# Patient Record
Sex: Female | Born: 1970
Health system: Southern US, Community
[De-identification: ages and names within clinical notes are randomized; demographics above are authoritative.]

## PROBLEM LIST (undated history)

## (undated) ENCOUNTER — Ambulatory Visit (HOSPITAL_COMMUNITY): Admission: EM

## (undated) DIAGNOSIS — N898 Other specified noninflammatory disorders of vagina: Secondary | ICD-10-CM

## (undated) DIAGNOSIS — M25559 Pain in unspecified hip: Secondary | ICD-10-CM

## (undated) DIAGNOSIS — M542 Cervicalgia: Secondary | ICD-10-CM

## (undated) DIAGNOSIS — Z973 Presence of spectacles and contact lenses: Secondary | ICD-10-CM

## (undated) DIAGNOSIS — M549 Dorsalgia, unspecified: Secondary | ICD-10-CM

## (undated) DIAGNOSIS — I890 Lymphedema, not elsewhere classified: Secondary | ICD-10-CM

## (undated) DIAGNOSIS — M130 Polyarthritis, unspecified: Secondary | ICD-10-CM

## (undated) DIAGNOSIS — M25552 Pain in left hip: Secondary | ICD-10-CM

## (undated) DIAGNOSIS — K219 Gastro-esophageal reflux disease without esophagitis: Secondary | ICD-10-CM

## (undated) DIAGNOSIS — R42 Dizziness and giddiness: Secondary | ICD-10-CM

## (undated) DIAGNOSIS — E669 Obesity, unspecified: Secondary | ICD-10-CM

## (undated) DIAGNOSIS — R928 Other abnormal and inconclusive findings on diagnostic imaging of breast: Secondary | ICD-10-CM

## (undated) DIAGNOSIS — M25551 Pain in right hip: Secondary | ICD-10-CM

## (undated) DIAGNOSIS — R1031 Right lower quadrant pain: Secondary | ICD-10-CM

## (undated) DIAGNOSIS — D1739 Benign lipomatous neoplasm of skin and subcutaneous tissue of other sites: Secondary | ICD-10-CM

## (undated) DIAGNOSIS — S42009A Fracture of unspecified part of unspecified clavicle, initial encounter for closed fracture: Secondary | ICD-10-CM

## (undated) DIAGNOSIS — R5383 Other fatigue: Secondary | ICD-10-CM

## (undated) DIAGNOSIS — I10 Essential (primary) hypertension: Secondary | ICD-10-CM

## (undated) DIAGNOSIS — M1990 Unspecified osteoarthritis, unspecified site: Secondary | ICD-10-CM

## (undated) DIAGNOSIS — N83201 Unspecified ovarian cyst, right side: Secondary | ICD-10-CM

## (undated) DIAGNOSIS — M25519 Pain in unspecified shoulder: Secondary | ICD-10-CM

## (undated) DIAGNOSIS — L723 Sebaceous cyst: Secondary | ICD-10-CM

## (undated) DIAGNOSIS — N926 Irregular menstruation, unspecified: Secondary | ICD-10-CM

## (undated) DIAGNOSIS — Z23 Encounter for immunization: Secondary | ICD-10-CM

## (undated) HISTORY — PX: TONSILLECTOMY: SUR1361

## (undated) HISTORY — DX: Pain in right hip: M25.551

## (undated) HISTORY — DX: Right lower quadrant pain: R10.31

## (undated) HISTORY — DX: Dorsalgia, unspecified: M54.9

## (undated) HISTORY — DX: Pain in unspecified shoulder: M25.519

## (undated) HISTORY — DX: Cervicalgia: M54.2

## (undated) HISTORY — DX: Other specified noninflammatory disorders of vagina: N89.8

## (undated) HISTORY — DX: Unspecified osteoarthritis, unspecified site: M19.90

## (undated) HISTORY — DX: Gastro-esophageal reflux disease without esophagitis: K21.9

## (undated) HISTORY — PX: OTHER SURGICAL HISTORY: SHX169

## (undated) HISTORY — DX: Polyarthritis, unspecified: M13.0

## (undated) HISTORY — DX: Encounter for immunization: Z23

## (undated) HISTORY — PX: BREAST BIOPSY: SHX20

## (undated) HISTORY — PX: TONSILLECTOMY AND ADENOIDECTOMY: SUR1326

## (undated) HISTORY — DX: Unspecified ovarian cyst, right side: N83.201

## (undated) HISTORY — DX: Essential (primary) hypertension: I10

## (undated) HISTORY — DX: Pain in left hip: M25.552

## (undated) HISTORY — PX: DILATION AND CURETTAGE OF UTERUS: SHX78

## (undated) HISTORY — DX: Lymphedema, not elsewhere classified: I89.0

## (undated) HISTORY — DX: Pain in unspecified hip: M25.559

## (undated) HISTORY — DX: Fracture of unspecified part of unspecified clavicle, initial encounter for closed fracture: S42.009A

## (undated) HISTORY — DX: Dizziness and giddiness: R42

## (undated) HISTORY — DX: Benign lipomatous neoplasm of skin and subcutaneous tissue of other sites: D17.39

## (undated) HISTORY — PX: MYOMECTOMY ABDOMINAL APPROACH: SUR870

## (undated) HISTORY — DX: Other abnormal and inconclusive findings on diagnostic imaging of breast: R92.8

## (undated) HISTORY — DX: Other fatigue: R53.83

## (undated) HISTORY — DX: Irregular menstruation, unspecified: N92.6

## (undated) HISTORY — DX: Sebaceous cyst: L72.3

---

## 1994-06-04 HISTORY — PX: REDUCTION MAMMAPLASTY: SUR839

## 1994-06-04 HISTORY — PX: BREAST REDUCTION SURGERY: SHX8

## 1999-12-05 ENCOUNTER — Emergency Department (HOSPITAL_COMMUNITY): Admission: EM | Admit: 1999-12-05 | Discharge: 1999-12-05 | Payer: Self-pay | Admitting: Emergency Medicine

## 1999-12-28 ENCOUNTER — Encounter: Admission: RE | Admit: 1999-12-28 | Discharge: 1999-12-28 | Payer: Self-pay | Admitting: Hematology and Oncology

## 2000-11-16 ENCOUNTER — Emergency Department (HOSPITAL_COMMUNITY): Admission: EM | Admit: 2000-11-16 | Discharge: 2000-11-17 | Payer: Self-pay | Admitting: Emergency Medicine

## 2000-12-23 ENCOUNTER — Encounter: Payer: Self-pay | Admitting: Emergency Medicine

## 2000-12-23 ENCOUNTER — Emergency Department (HOSPITAL_COMMUNITY): Admission: EM | Admit: 2000-12-23 | Discharge: 2000-12-23 | Payer: Self-pay | Admitting: Emergency Medicine

## 2001-01-02 ENCOUNTER — Encounter (HOSPITAL_BASED_OUTPATIENT_CLINIC_OR_DEPARTMENT_OTHER): Payer: Self-pay | Admitting: General Surgery

## 2001-01-02 ENCOUNTER — Encounter: Admission: RE | Admit: 2001-01-02 | Discharge: 2001-01-02 | Payer: Self-pay | Admitting: General Surgery

## 2001-01-28 ENCOUNTER — Encounter: Admission: RE | Admit: 2001-01-28 | Discharge: 2001-01-28 | Payer: Self-pay | Admitting: Obstetrics & Gynecology

## 2001-01-30 ENCOUNTER — Ambulatory Visit (HOSPITAL_COMMUNITY): Admission: RE | Admit: 2001-01-30 | Discharge: 2001-01-30 | Payer: Self-pay | Admitting: Internal Medicine

## 2001-01-30 ENCOUNTER — Encounter: Payer: Self-pay | Admitting: Internal Medicine

## 2001-01-30 ENCOUNTER — Encounter: Admission: RE | Admit: 2001-01-30 | Discharge: 2001-01-30 | Payer: Self-pay | Admitting: Internal Medicine

## 2001-02-25 ENCOUNTER — Ambulatory Visit (HOSPITAL_COMMUNITY): Admission: RE | Admit: 2001-02-25 | Discharge: 2001-02-25 | Payer: Self-pay | Admitting: Obstetrics

## 2001-04-26 ENCOUNTER — Encounter: Payer: Self-pay | Admitting: Emergency Medicine

## 2001-04-26 ENCOUNTER — Emergency Department (HOSPITAL_COMMUNITY): Admission: EM | Admit: 2001-04-26 | Discharge: 2001-04-26 | Payer: Self-pay | Admitting: Emergency Medicine

## 2001-05-05 ENCOUNTER — Emergency Department (HOSPITAL_COMMUNITY): Admission: EM | Admit: 2001-05-05 | Discharge: 2001-05-05 | Payer: Self-pay | Admitting: Emergency Medicine

## 2001-06-25 ENCOUNTER — Ambulatory Visit (HOSPITAL_COMMUNITY): Admission: RE | Admit: 2001-06-25 | Discharge: 2001-06-25 | Payer: Self-pay | Admitting: Internal Medicine

## 2001-06-25 ENCOUNTER — Encounter: Payer: Self-pay | Admitting: Internal Medicine

## 2001-06-30 ENCOUNTER — Ambulatory Visit (HOSPITAL_COMMUNITY): Admission: RE | Admit: 2001-06-30 | Discharge: 2001-06-30 | Payer: Self-pay | Admitting: Family Medicine

## 2001-06-30 ENCOUNTER — Encounter: Payer: Self-pay | Admitting: Family Medicine

## 2001-09-18 ENCOUNTER — Emergency Department (HOSPITAL_COMMUNITY): Admission: EM | Admit: 2001-09-18 | Discharge: 2001-09-19 | Payer: Self-pay | Admitting: *Deleted

## 2001-09-22 ENCOUNTER — Emergency Department (HOSPITAL_COMMUNITY): Admission: EM | Admit: 2001-09-22 | Discharge: 2001-09-22 | Payer: Self-pay | Admitting: *Deleted

## 2001-11-14 ENCOUNTER — Emergency Department (HOSPITAL_COMMUNITY): Admission: EM | Admit: 2001-11-14 | Discharge: 2001-11-14 | Payer: Self-pay | Admitting: Emergency Medicine

## 2001-11-14 ENCOUNTER — Encounter: Payer: Self-pay | Admitting: Emergency Medicine

## 2001-12-04 ENCOUNTER — Emergency Department (HOSPITAL_COMMUNITY): Admission: EM | Admit: 2001-12-04 | Discharge: 2001-12-04 | Payer: Self-pay | Admitting: Emergency Medicine

## 2002-02-26 ENCOUNTER — Ambulatory Visit (HOSPITAL_COMMUNITY): Admission: RE | Admit: 2002-02-26 | Discharge: 2002-02-26 | Payer: Self-pay | Admitting: Obstetrics and Gynecology

## 2002-02-26 ENCOUNTER — Encounter (INDEPENDENT_AMBULATORY_CARE_PROVIDER_SITE_OTHER): Payer: Self-pay

## 2002-04-07 ENCOUNTER — Encounter: Payer: Self-pay | Admitting: Obstetrics and Gynecology

## 2002-04-07 ENCOUNTER — Ambulatory Visit (HOSPITAL_COMMUNITY): Admission: RE | Admit: 2002-04-07 | Discharge: 2002-04-07 | Payer: Self-pay | Admitting: Obstetrics and Gynecology

## 2002-04-11 ENCOUNTER — Emergency Department (HOSPITAL_COMMUNITY): Admission: EM | Admit: 2002-04-11 | Discharge: 2002-04-11 | Payer: Self-pay | Admitting: Emergency Medicine

## 2002-04-20 ENCOUNTER — Inpatient Hospital Stay (HOSPITAL_COMMUNITY): Admission: RE | Admit: 2002-04-20 | Discharge: 2002-04-22 | Payer: Self-pay | Admitting: Obstetrics and Gynecology

## 2002-04-20 ENCOUNTER — Encounter (INDEPENDENT_AMBULATORY_CARE_PROVIDER_SITE_OTHER): Payer: Self-pay | Admitting: Specialist

## 2002-06-11 ENCOUNTER — Emergency Department (HOSPITAL_COMMUNITY): Admission: EM | Admit: 2002-06-11 | Discharge: 2002-06-11 | Payer: Self-pay | Admitting: *Deleted

## 2002-06-15 ENCOUNTER — Emergency Department (HOSPITAL_COMMUNITY): Admission: EM | Admit: 2002-06-15 | Discharge: 2002-06-15 | Payer: Self-pay | Admitting: Emergency Medicine

## 2002-07-07 ENCOUNTER — Ambulatory Visit (HOSPITAL_COMMUNITY): Admission: RE | Admit: 2002-07-07 | Discharge: 2002-07-07 | Payer: Self-pay | Admitting: Family Medicine

## 2002-07-07 ENCOUNTER — Encounter (INDEPENDENT_AMBULATORY_CARE_PROVIDER_SITE_OTHER): Payer: Self-pay | Admitting: Cardiology

## 2003-04-18 ENCOUNTER — Emergency Department (HOSPITAL_COMMUNITY): Admission: AD | Admit: 2003-04-18 | Discharge: 2003-04-18 | Payer: Self-pay | Admitting: Family Medicine

## 2003-10-12 ENCOUNTER — Emergency Department (HOSPITAL_COMMUNITY): Admission: EM | Admit: 2003-10-12 | Discharge: 2003-10-12 | Payer: Self-pay | Admitting: Family Medicine

## 2003-10-14 ENCOUNTER — Emergency Department (HOSPITAL_COMMUNITY): Admission: EM | Admit: 2003-10-14 | Discharge: 2003-10-14 | Payer: Self-pay | Admitting: Emergency Medicine

## 2003-10-28 ENCOUNTER — Emergency Department (HOSPITAL_COMMUNITY): Admission: EM | Admit: 2003-10-28 | Discharge: 2003-10-28 | Payer: Self-pay

## 2003-11-08 ENCOUNTER — Encounter: Admission: RE | Admit: 2003-11-08 | Discharge: 2003-11-08 | Payer: Self-pay | Admitting: Internal Medicine

## 2003-11-30 ENCOUNTER — Encounter: Admission: RE | Admit: 2003-11-30 | Discharge: 2003-11-30 | Payer: Self-pay | Admitting: Internal Medicine

## 2003-12-02 ENCOUNTER — Encounter (INDEPENDENT_AMBULATORY_CARE_PROVIDER_SITE_OTHER): Payer: Self-pay | Admitting: *Deleted

## 2003-12-02 ENCOUNTER — Encounter: Admission: RE | Admit: 2003-12-02 | Discharge: 2003-12-02 | Payer: Self-pay | Admitting: Internal Medicine

## 2003-12-30 ENCOUNTER — Encounter: Admission: RE | Admit: 2003-12-30 | Discharge: 2003-12-30 | Payer: Self-pay | Admitting: Internal Medicine

## 2004-01-04 ENCOUNTER — Emergency Department (HOSPITAL_COMMUNITY): Admission: EM | Admit: 2004-01-04 | Discharge: 2004-01-04 | Payer: Self-pay | Admitting: Emergency Medicine

## 2004-02-23 ENCOUNTER — Emergency Department (HOSPITAL_COMMUNITY): Admission: EM | Admit: 2004-02-23 | Discharge: 2004-02-23 | Payer: Self-pay | Admitting: Emergency Medicine

## 2004-02-23 ENCOUNTER — Ambulatory Visit (HOSPITAL_COMMUNITY): Admission: RE | Admit: 2004-02-23 | Discharge: 2004-02-23 | Payer: Self-pay | Admitting: Emergency Medicine

## 2004-05-03 ENCOUNTER — Emergency Department (HOSPITAL_COMMUNITY): Admission: EM | Admit: 2004-05-03 | Discharge: 2004-05-03 | Payer: Self-pay | Admitting: Emergency Medicine

## 2004-05-14 ENCOUNTER — Emergency Department (HOSPITAL_COMMUNITY): Admission: EM | Admit: 2004-05-14 | Discharge: 2004-05-14 | Payer: Self-pay | Admitting: Family Medicine

## 2004-06-21 ENCOUNTER — Encounter: Admission: RE | Admit: 2004-06-21 | Discharge: 2004-06-21 | Payer: Self-pay | Admitting: Sports Medicine

## 2004-12-04 ENCOUNTER — Emergency Department (HOSPITAL_COMMUNITY): Admission: EM | Admit: 2004-12-04 | Discharge: 2004-12-05 | Payer: Self-pay | Admitting: Emergency Medicine

## 2004-12-08 ENCOUNTER — Encounter: Admission: RE | Admit: 2004-12-08 | Discharge: 2004-12-08 | Payer: Self-pay | Admitting: Sports Medicine

## 2005-03-19 ENCOUNTER — Ambulatory Visit: Payer: Self-pay | Admitting: Internal Medicine

## 2005-03-24 ENCOUNTER — Emergency Department (HOSPITAL_COMMUNITY): Admission: EM | Admit: 2005-03-24 | Discharge: 2005-03-24 | Payer: Self-pay | Admitting: Emergency Medicine

## 2005-03-27 ENCOUNTER — Ambulatory Visit: Payer: Self-pay | Admitting: Sports Medicine

## 2005-04-13 ENCOUNTER — Ambulatory Visit (HOSPITAL_BASED_OUTPATIENT_CLINIC_OR_DEPARTMENT_OTHER): Admission: RE | Admit: 2005-04-13 | Discharge: 2005-04-13 | Payer: Self-pay | Admitting: Internal Medicine

## 2005-04-20 ENCOUNTER — Ambulatory Visit: Payer: Self-pay | Admitting: Internal Medicine

## 2005-04-22 ENCOUNTER — Ambulatory Visit: Payer: Self-pay | Admitting: Internal Medicine

## 2005-05-08 ENCOUNTER — Encounter: Admission: RE | Admit: 2005-05-08 | Discharge: 2005-06-29 | Payer: Self-pay | Admitting: Internal Medicine

## 2005-05-09 ENCOUNTER — Emergency Department (HOSPITAL_COMMUNITY): Admission: EM | Admit: 2005-05-09 | Discharge: 2005-05-09 | Payer: Self-pay | Admitting: Emergency Medicine

## 2005-07-03 ENCOUNTER — Ambulatory Visit (HOSPITAL_COMMUNITY): Admission: RE | Admit: 2005-07-03 | Discharge: 2005-07-03 | Payer: Self-pay | Admitting: Hospitalist

## 2005-07-03 ENCOUNTER — Ambulatory Visit: Payer: Self-pay | Admitting: Hospitalist

## 2005-07-09 ENCOUNTER — Emergency Department (HOSPITAL_COMMUNITY): Admission: EM | Admit: 2005-07-09 | Discharge: 2005-07-09 | Payer: Self-pay | Admitting: Emergency Medicine

## 2005-08-14 ENCOUNTER — Ambulatory Visit: Payer: Self-pay | Admitting: Hospitalist

## 2005-08-31 ENCOUNTER — Emergency Department (HOSPITAL_COMMUNITY): Admission: EM | Admit: 2005-08-31 | Discharge: 2005-08-31 | Payer: Self-pay | Admitting: Pediatrics

## 2005-11-02 ENCOUNTER — Ambulatory Visit: Payer: Self-pay | Admitting: Internal Medicine

## 2006-01-04 ENCOUNTER — Encounter: Admission: RE | Admit: 2006-01-04 | Discharge: 2006-01-04 | Payer: Self-pay | Admitting: Internal Medicine

## 2006-03-23 ENCOUNTER — Emergency Department (HOSPITAL_COMMUNITY): Admission: EM | Admit: 2006-03-23 | Discharge: 2006-03-23 | Payer: Self-pay | Admitting: Emergency Medicine

## 2006-04-05 DIAGNOSIS — N926 Irregular menstruation, unspecified: Secondary | ICD-10-CM

## 2006-04-05 DIAGNOSIS — M25559 Pain in unspecified hip: Secondary | ICD-10-CM

## 2006-04-05 DIAGNOSIS — E1142 Type 2 diabetes mellitus with diabetic polyneuropathy: Secondary | ICD-10-CM | POA: Insufficient documentation

## 2006-04-05 DIAGNOSIS — J309 Allergic rhinitis, unspecified: Secondary | ICD-10-CM | POA: Insufficient documentation

## 2006-04-05 DIAGNOSIS — R928 Other abnormal and inconclusive findings on diagnostic imaging of breast: Secondary | ICD-10-CM | POA: Insufficient documentation

## 2006-04-05 DIAGNOSIS — I890 Lymphedema, not elsewhere classified: Secondary | ICD-10-CM

## 2006-04-05 DIAGNOSIS — N83209 Unspecified ovarian cyst, unspecified side: Secondary | ICD-10-CM

## 2006-04-05 DIAGNOSIS — M064 Inflammatory polyarthropathy: Secondary | ICD-10-CM | POA: Insufficient documentation

## 2006-04-05 DIAGNOSIS — I10 Essential (primary) hypertension: Secondary | ICD-10-CM

## 2006-04-05 HISTORY — DX: Pain in unspecified hip: M25.559

## 2006-04-27 ENCOUNTER — Emergency Department (HOSPITAL_COMMUNITY): Admission: EM | Admit: 2006-04-27 | Discharge: 2006-04-28 | Payer: Self-pay | Admitting: Emergency Medicine

## 2006-04-30 ENCOUNTER — Encounter (INDEPENDENT_AMBULATORY_CARE_PROVIDER_SITE_OTHER): Payer: Self-pay | Admitting: *Deleted

## 2006-04-30 ENCOUNTER — Ambulatory Visit: Payer: Self-pay | Admitting: Internal Medicine

## 2006-04-30 LAB — CONVERTED CEMR LAB
FSH: 2 milliintl units/mL
LH: 1.9 milliintl units/mL
TSH: 2.945 microintl units/mL (ref 0.350–5.50)
hCG, Beta Chain, Quant, S: <2 milliintl units/mL

## 2006-07-02 DIAGNOSIS — K219 Gastro-esophageal reflux disease without esophagitis: Secondary | ICD-10-CM

## 2006-07-29 ENCOUNTER — Emergency Department (HOSPITAL_COMMUNITY): Admission: EM | Admit: 2006-07-29 | Discharge: 2006-07-29 | Payer: Self-pay | Admitting: Emergency Medicine

## 2006-08-22 ENCOUNTER — Emergency Department (HOSPITAL_COMMUNITY): Admission: EM | Admit: 2006-08-22 | Discharge: 2006-08-22 | Payer: Self-pay | Admitting: Emergency Medicine

## 2006-08-26 ENCOUNTER — Emergency Department (HOSPITAL_COMMUNITY): Admission: EM | Admit: 2006-08-26 | Discharge: 2006-08-26 | Payer: Self-pay | Admitting: *Deleted

## 2006-11-26 ENCOUNTER — Encounter (INDEPENDENT_AMBULATORY_CARE_PROVIDER_SITE_OTHER): Payer: Self-pay | Admitting: *Deleted

## 2006-11-26 ENCOUNTER — Ambulatory Visit: Payer: Self-pay | Admitting: Internal Medicine

## 2006-11-26 LAB — CONVERTED CEMR LAB
BUN: 6 mg/dL (ref 6–23)
Bilirubin Urine: NEGATIVE
CO2: 26 meq/L (ref 19–32)
Glucose, Bld: 240 mg/dL — ABNORMAL HIGH (ref 70–99)
Hemoglobin, Urine: NEGATIVE
Ketones, ur: NEGATIVE mg/dL
Leukocytes, UA: NEGATIVE
Potassium: 4.2 meq/L (ref 3.5–5.3)
Protein, ur: NEGATIVE mg/dL
Sodium: 138 meq/L (ref 135–145)
Urine Glucose: 500 mg/dL — AB
pH: 5.5 (ref 5.0–8.0)

## 2007-03-06 ENCOUNTER — Encounter: Admission: RE | Admit: 2007-03-06 | Discharge: 2007-03-06 | Payer: Self-pay | Admitting: Internal Medicine

## 2007-05-05 ENCOUNTER — Emergency Department (HOSPITAL_COMMUNITY): Admission: EM | Admit: 2007-05-05 | Discharge: 2007-05-05 | Payer: Self-pay | Admitting: Emergency Medicine

## 2007-08-03 ENCOUNTER — Emergency Department (HOSPITAL_COMMUNITY): Admission: EM | Admit: 2007-08-03 | Discharge: 2007-08-03 | Payer: Self-pay | Admitting: Emergency Medicine

## 2008-02-03 ENCOUNTER — Telehealth (INDEPENDENT_AMBULATORY_CARE_PROVIDER_SITE_OTHER): Payer: Self-pay | Admitting: Internal Medicine

## 2008-03-19 ENCOUNTER — Ambulatory Visit: Payer: Self-pay | Admitting: Internal Medicine

## 2008-03-19 ENCOUNTER — Ambulatory Visit (HOSPITAL_COMMUNITY): Admission: RE | Admit: 2008-03-19 | Discharge: 2008-03-19 | Payer: Self-pay | Admitting: Internal Medicine

## 2008-03-19 ENCOUNTER — Encounter (INDEPENDENT_AMBULATORY_CARE_PROVIDER_SITE_OTHER): Payer: Self-pay | Admitting: Internal Medicine

## 2008-03-19 DIAGNOSIS — R002 Palpitations: Secondary | ICD-10-CM

## 2008-03-19 DIAGNOSIS — G589 Mononeuropathy, unspecified: Secondary | ICD-10-CM | POA: Insufficient documentation

## 2008-03-30 ENCOUNTER — Encounter (INDEPENDENT_AMBULATORY_CARE_PROVIDER_SITE_OTHER): Payer: Self-pay | Admitting: Internal Medicine

## 2008-03-30 ENCOUNTER — Encounter: Payer: Self-pay | Admitting: Internal Medicine

## 2008-03-31 ENCOUNTER — Ambulatory Visit (HOSPITAL_COMMUNITY): Admission: RE | Admit: 2008-03-31 | Discharge: 2008-03-31 | Payer: Self-pay | Admitting: Internal Medicine

## 2008-05-05 ENCOUNTER — Ambulatory Visit: Payer: Self-pay | Admitting: Internal Medicine

## 2008-05-05 DIAGNOSIS — J069 Acute upper respiratory infection, unspecified: Secondary | ICD-10-CM | POA: Insufficient documentation

## 2008-05-05 DIAGNOSIS — D1739 Benign lipomatous neoplasm of skin and subcutaneous tissue of other sites: Secondary | ICD-10-CM

## 2008-05-05 HISTORY — DX: Benign lipomatous neoplasm of skin and subcutaneous tissue of other sites: D17.39

## 2008-06-13 ENCOUNTER — Telehealth (INDEPENDENT_AMBULATORY_CARE_PROVIDER_SITE_OTHER): Payer: Self-pay | Admitting: Internal Medicine

## 2008-06-13 ENCOUNTER — Emergency Department (HOSPITAL_COMMUNITY): Admission: EM | Admit: 2008-06-13 | Discharge: 2008-06-13 | Payer: Self-pay | Admitting: Emergency Medicine

## 2008-10-08 ENCOUNTER — Emergency Department (HOSPITAL_COMMUNITY): Admission: EM | Admit: 2008-10-08 | Discharge: 2008-10-08 | Payer: Self-pay | Admitting: Emergency Medicine

## 2008-10-11 ENCOUNTER — Emergency Department (HOSPITAL_COMMUNITY): Admission: EM | Admit: 2008-10-11 | Discharge: 2008-10-12 | Payer: Self-pay | Admitting: Emergency Medicine

## 2008-10-18 ENCOUNTER — Emergency Department (HOSPITAL_COMMUNITY): Admission: EM | Admit: 2008-10-18 | Discharge: 2008-10-18 | Payer: Self-pay | Admitting: Emergency Medicine

## 2008-10-25 ENCOUNTER — Telehealth (INDEPENDENT_AMBULATORY_CARE_PROVIDER_SITE_OTHER): Payer: Self-pay | Admitting: Internal Medicine

## 2008-11-02 ENCOUNTER — Encounter (INDEPENDENT_AMBULATORY_CARE_PROVIDER_SITE_OTHER): Payer: Self-pay | Admitting: Internal Medicine

## 2008-11-15 ENCOUNTER — Telehealth (INDEPENDENT_AMBULATORY_CARE_PROVIDER_SITE_OTHER): Payer: Self-pay | Admitting: Internal Medicine

## 2008-11-22 ENCOUNTER — Ambulatory Visit: Payer: Self-pay | Admitting: Internal Medicine

## 2008-11-22 DIAGNOSIS — S42009A Fracture of unspecified part of unspecified clavicle, initial encounter for closed fracture: Secondary | ICD-10-CM

## 2008-11-22 HISTORY — DX: Fracture of unspecified part of unspecified clavicle, initial encounter for closed fracture: S42.009A

## 2008-12-07 ENCOUNTER — Telehealth: Payer: Self-pay | Admitting: Internal Medicine

## 2008-12-23 ENCOUNTER — Encounter: Admission: RE | Admit: 2008-12-23 | Discharge: 2009-01-26 | Payer: Self-pay | Admitting: Internal Medicine

## 2008-12-23 ENCOUNTER — Telehealth: Payer: Self-pay | Admitting: Internal Medicine

## 2008-12-29 ENCOUNTER — Encounter: Payer: Self-pay | Admitting: Internal Medicine

## 2009-01-04 ENCOUNTER — Encounter: Payer: Self-pay | Admitting: Internal Medicine

## 2009-02-26 ENCOUNTER — Encounter: Payer: Self-pay | Admitting: Internal Medicine

## 2009-03-15 ENCOUNTER — Ambulatory Visit: Payer: Self-pay | Admitting: Internal Medicine

## 2009-03-15 ENCOUNTER — Encounter: Payer: Self-pay | Admitting: Internal Medicine

## 2009-03-15 DIAGNOSIS — R1031 Right lower quadrant pain: Secondary | ICD-10-CM

## 2009-03-15 HISTORY — DX: Right lower quadrant pain: R10.31

## 2009-03-15 LAB — CONVERTED CEMR LAB
Blood Glucose, Fingerstick: 123
Hgb A1c MFr Bld: 7.7 %

## 2009-03-23 ENCOUNTER — Ambulatory Visit (HOSPITAL_COMMUNITY): Admission: RE | Admit: 2009-03-23 | Discharge: 2009-03-23 | Payer: Self-pay | Admitting: Internal Medicine

## 2009-03-26 ENCOUNTER — Emergency Department (HOSPITAL_COMMUNITY): Admission: EM | Admit: 2009-03-26 | Discharge: 2009-03-26 | Payer: Self-pay | Admitting: Family Medicine

## 2009-05-04 ENCOUNTER — Encounter: Admission: RE | Admit: 2009-05-04 | Discharge: 2009-05-04 | Payer: Self-pay | Admitting: General Surgery

## 2009-05-05 ENCOUNTER — Encounter: Payer: Self-pay | Admitting: Internal Medicine

## 2009-05-05 ENCOUNTER — Telehealth (INDEPENDENT_AMBULATORY_CARE_PROVIDER_SITE_OTHER): Payer: Self-pay | Admitting: *Deleted

## 2009-05-11 ENCOUNTER — Encounter (INDEPENDENT_AMBULATORY_CARE_PROVIDER_SITE_OTHER): Payer: Self-pay | Admitting: *Deleted

## 2009-06-17 ENCOUNTER — Ambulatory Visit: Payer: Self-pay | Admitting: Internal Medicine

## 2009-06-17 DIAGNOSIS — J45909 Unspecified asthma, uncomplicated: Secondary | ICD-10-CM | POA: Insufficient documentation

## 2009-06-17 LAB — CONVERTED CEMR LAB
Blood Glucose, Fingerstick: 289
Hgb A1c MFr Bld: 8.3 %

## 2009-06-20 ENCOUNTER — Telehealth: Payer: Self-pay | Admitting: Internal Medicine

## 2009-06-27 ENCOUNTER — Ambulatory Visit (HOSPITAL_COMMUNITY): Admission: RE | Admit: 2009-06-27 | Discharge: 2009-06-27 | Payer: Self-pay | Admitting: Internal Medicine

## 2009-06-27 ENCOUNTER — Encounter: Payer: Self-pay | Admitting: Internal Medicine

## 2009-06-29 ENCOUNTER — Emergency Department (HOSPITAL_COMMUNITY): Admission: EM | Admit: 2009-06-29 | Discharge: 2009-06-29 | Payer: Self-pay | Admitting: Family Medicine

## 2009-08-29 ENCOUNTER — Ambulatory Visit: Payer: Self-pay | Admitting: Internal Medicine

## 2009-08-29 DIAGNOSIS — J329 Chronic sinusitis, unspecified: Secondary | ICD-10-CM | POA: Insufficient documentation

## 2009-08-29 LAB — CONVERTED CEMR LAB
BUN: 6 mg/dL (ref 6–23)
Creatinine, Ser: 0.54 mg/dL (ref 0.40–1.20)

## 2009-08-30 ENCOUNTER — Telehealth: Payer: Self-pay | Admitting: *Deleted

## 2009-09-06 ENCOUNTER — Encounter: Payer: Self-pay | Admitting: Internal Medicine

## 2009-09-27 ENCOUNTER — Telehealth: Payer: Self-pay | Admitting: Internal Medicine

## 2009-11-02 ENCOUNTER — Ambulatory Visit: Payer: Self-pay | Admitting: Internal Medicine

## 2009-11-02 ENCOUNTER — Ambulatory Visit (HOSPITAL_COMMUNITY): Admission: RE | Admit: 2009-11-02 | Discharge: 2009-11-02 | Payer: Self-pay | Admitting: Internal Medicine

## 2009-11-02 LAB — CONVERTED CEMR LAB: Blood Glucose, Fingerstick: 115

## 2009-11-03 LAB — CONVERTED CEMR LAB
ALT: 10 units/L (ref 0–35)
AST: 13 units/L (ref 0–37)
CRP: 2.4 mg/dL — ABNORMAL HIGH (ref <0.6)
Creatinine, Ser: 0.53 mg/dL (ref 0.40–1.20)
Sed Rate: 55 mm/hr — ABNORMAL HIGH (ref 0–22)
TSH: 2.412 microintl units/mL (ref 0.350–4.5)
Total Bilirubin: 0.5 mg/dL (ref 0.3–1.2)
Total CHOL/HDL Ratio: 4.3
VLDL: 23 mg/dL (ref 0–40)

## 2009-11-07 ENCOUNTER — Telehealth: Payer: Self-pay | Admitting: Internal Medicine

## 2009-11-23 ENCOUNTER — Encounter: Payer: Self-pay | Admitting: Internal Medicine

## 2010-01-02 ENCOUNTER — Ambulatory Visit: Payer: Self-pay | Admitting: Internal Medicine

## 2010-01-02 ENCOUNTER — Encounter: Payer: Self-pay | Admitting: Licensed Clinical Social Worker

## 2010-01-03 ENCOUNTER — Telehealth: Payer: Self-pay | Admitting: Internal Medicine

## 2010-01-04 ENCOUNTER — Telehealth: Payer: Self-pay | Admitting: Internal Medicine

## 2010-01-04 ENCOUNTER — Telehealth: Payer: Self-pay | Admitting: Licensed Clinical Social Worker

## 2010-01-06 ENCOUNTER — Encounter: Payer: Self-pay | Admitting: Internal Medicine

## 2010-01-11 ENCOUNTER — Telehealth: Payer: Self-pay | Admitting: Licensed Clinical Social Worker

## 2010-01-17 ENCOUNTER — Ambulatory Visit: Payer: Self-pay | Admitting: Internal Medicine

## 2010-01-17 LAB — CONVERTED CEMR LAB: Blood Glucose, Fingerstick: 117

## 2010-01-23 ENCOUNTER — Telehealth: Payer: Self-pay | Admitting: Licensed Clinical Social Worker

## 2010-02-07 ENCOUNTER — Encounter: Payer: Self-pay | Admitting: Internal Medicine

## 2010-02-15 ENCOUNTER — Ambulatory Visit: Payer: Self-pay | Admitting: Internal Medicine

## 2010-03-14 ENCOUNTER — Ambulatory Visit: Payer: Self-pay | Admitting: Internal Medicine

## 2010-03-14 ENCOUNTER — Ambulatory Visit (HOSPITAL_COMMUNITY): Admission: RE | Admit: 2010-03-14 | Discharge: 2010-03-14 | Payer: Self-pay | Admitting: Internal Medicine

## 2010-03-14 ENCOUNTER — Encounter: Payer: Self-pay | Admitting: Internal Medicine

## 2010-03-14 LAB — CONVERTED CEMR LAB
Creatinine, Urine: 129.6 mg/dL
Microalb Creat Ratio: 18 mg/g (ref 0.0–30.0)

## 2010-04-18 ENCOUNTER — Ambulatory Visit: Payer: Self-pay

## 2010-04-28 ENCOUNTER — Emergency Department (HOSPITAL_COMMUNITY): Admission: EM | Admit: 2010-04-28 | Discharge: 2010-04-28 | Payer: Self-pay | Admitting: Emergency Medicine

## 2010-06-01 ENCOUNTER — Encounter: Payer: Self-pay | Admitting: Internal Medicine

## 2010-07-02 LAB — CONVERTED CEMR LAB
ALT: 14 units/L (ref 0–35)
AST: 17 units/L (ref 0–37)
Albumin: 3.4 g/dL — ABNORMAL LOW (ref 3.5–5.2)
BUN: 5 mg/dL — ABNORMAL LOW (ref 6–23)
Basophils Relative: 0 % (ref 0–1)
CK-MB: 0.6 ng/mL (ref 0.3–4.0)
CO2: 24 meq/L (ref 19–32)
Calcium: 8.9 mg/dL (ref 8.4–10.5)
Chloride: 105 meq/L (ref 96–112)
Lymphocytes Relative: 39 % (ref 12–46)
MCHC: 32.8 g/dL (ref 30.0–36.0)
Monocytes Relative: 6 % (ref 3–12)
Neutro Abs: 5.1 10*3/uL (ref 1.7–7.7)
Neutrophils Relative %: 51 % (ref 43–77)
Potassium: 3.6 meq/L (ref 3.5–5.3)
RBC: 4.52 M/uL (ref 3.87–5.11)
Relative Index: 0.5 (ref 0.0–2.5)
Total CK: 111 units/L (ref 7–177)
Vitamin B-12: 391 pg/mL (ref 211–911)
WBC: 10 10*3/uL (ref 4.0–10.5)

## 2010-07-03 ENCOUNTER — Telehealth: Payer: Self-pay | Admitting: *Deleted

## 2010-07-04 NOTE — Assessment & Plan Note (Signed)
Summary: pt states wheezing/pcp-karimova/hla   Vital Signs:  Patient profile:   40 year old female Height:      65 inches Weight:      286.1 pounds BMI:     47.78 O2 Sat:      100 % on Room air Temp:     97.0 degrees F oral Pulse rate:   90 / minute BP sitting:   146 / 96  (right arm)  Vitals Entered By: Filomena Jungling NT II (June 17, 2009 3:40 PM)  O2 Flow:  Room air CC: cough and conjestion - yellow flem Is Patient Diabetic? Yes Did you bring your meter with you today? No Pain Assessment Patient in pain? no      Nutritional Status BMI of > 30 = obese CBG Result 289  Have you ever been in a relationship where you felt threatened, hurt or afraid?No   Does patient need assistance? Functional Status Self care Ambulation Normal   Primary Care Provider:  Deatra Robinson MD  CC:  cough and conjestion - yellow flem.  History of Present Illness: 40 yr old woman with pmhx as described below comes to the clinic for evaluation of possible asthma. Patient reports that has had episodes of wheezing, and shortness of breath. Most current symptoms as of last night. Patient has never been formally diagnosed with asthma, but has been treated in the ED for asthma based on the symptoms in the past. Patient has been given albuterol mdi  but currently does not have an albuterol inhaler. Has not had an albuterol inhaler since the summer.  Patient reports that during the summer times she usually gets treated about 3 times. This is the first time she has had these problems in the winter time. Denies fever or chills. Reports having sinus pressure, and eyes itching but no drainage.   Patient reports that she has not been taking diabetes medication as directed. Patient would like to get back on neurontin for foot pain rather than amitriptyline because its too strong.   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 Take One Tab in The Morning and Two Tabs At Night. 2)  Enalapril Maleate 10 Mg Tabs (Enalapril Maleate) .... Take 1 Tablet By Mouth Once A Day 3)  Omeprazole 20 Mg  Tbec (Omeprazole) .... Take 2 Pills By Mouth Once A Day 4)  Aspirin Adult Low Strength 81 Mg Tbec (Aspirin) .... Take One Tab By Mouth Daily. 5)  Amitriptyline Hcl 25 Mg Tabs (Amitriptyline Hcl) .... Take One Tab By Mouth At Bedtime.  6)  Glucotrol Xl 10 Mg Xr24h-Tab (Glipizide) .... Take 2 Tablets By Mouth Once A Day 7)  Colace 100 Mg Caps (Docusate Sodium) .... Take 1 Tablet By Mouth Two Times A Day 8)  Claritin 10 Mg Tabs (Loratadine) .... Take 1 Tablet By Mouth Once A Day  Current Medications (verified): 1)  Metformin Hcl 500 Mg Tabs (Metformin Hcl) .... Take One Tab in The Morning and Two Tabs At Night. 2)  Enalapril Maleate 10 Mg Tabs (Enalapril Maleate) .... Take 1 Tablet By Mouth Once A Day 3)  Omeprazole 20 Mg  Tbec (Omeprazole) .... Take 2 Pills By Mouth Once A Day 4)  Aspirin Adult Low Strength 81 Mg Tbec (Aspirin) .... Take One Tab By Mouth Daily. 5)  Amitriptyline Hcl 25 Mg Tabs (Amitriptyline  Hcl) .... Take One Tab By Mouth At Bedtime. 6)  Glucotrol Xl 10 Mg Xr24h-Tab (Glipizide) .... Take 2 Tablets By Mouth Once A Day 7)  Colace 100 Mg Caps (Docusate Sodium) .... Take 1 Tablet By Mouth Two Times A Day 8)  Claritin 10 Mg Tabs (Loratadine) .... 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    Risk Factors: Exercise: yes (06/17/2009)  Risk Factors: Smoking Status: quit (06/17/2009)  Family History: Reviewed history from 14-Apr-2008 and no changes required. Mom- died of breast cancer, HTN, anemia NOS Aunt- breast cancer Grandmother- lung cancer, HTN, DM Sister - sickle cell trait  Social History: Reviewed history from 04/14/08 and no changes required. No smoke, drink, or illicit drug use. 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    Review of Systems       The patient complains of dyspnea on exertion.  The patient denies fever, chest pain, peripheral edema, prolonged cough, headaches, hemoptysis, abdominal pain, melena, hematochezia, hematuria, muscle  weakness, difficulty walking, unusual weight change, and abnormal bleeding.    Physical Exam  General:  NAD, obese Nose:  no nasal discharge.   Mouth:  MMM Lungs:  CTAB, good air movement  Heart:   ?1/6 SEM but regular rhythm and no rubs or gallops.   Abdomen:  Bowel sounds positive,abdomen soft and non-tender without masses, organomegaly or hernias noted. Extremities:  No clubbing, cyanosis, edema, or deformity noted with normal full range of motion of all joints.   Neurologic:  alert & oriented X3, strength normal in all extremities, and gait normal.   Psych:  normally interactive.     Impression & Recommendations:  Problem # 1:  ASTHMA (ICD-493.90) Will referr for definitive diagonisis of asthma with PFT's. Prescribe albuterol mdi, and  follow up on results.  Her updated medication list for this problem includes:    Ventolin  Hfa 108 (90 Base) Mcg/act Aers (Albuterol sulfate) .Marland Kitchen... 2 puff inhaled every 4-6 hours as needed for wheezing or shortness of breath  Orders: Full Pulmonary Function Test (PFT)  Pulmonary Functions Reviewed: O2 sat: 100 (06/17/2009)  Problem # 2:  NEUROPATHY (ICD-355.9) Taper off amitriptyline and restart neurontin. Follow up in one month.  Problem # 3:  HYPERTENSION (ICD-401.9) Elevated continue to monitor. No change todays as prior BP reading wnl.  If bp continues to be elevated on follow up will add norvasc.  Her updated medication list for this problem includes:    Enalapril Maleate 10 Mg Tabs (Enalapril maleate) .Marland Kitchen... Take 1 tablet by mouth once a day  BP today: 146/96 Prior BP: 122/83 (03/15/2009)  Labs Reviewed: K+: 3.6 (03/19/2008) Creat: : 0.54 (03/19/2008)     Problem # 4:  DIABETES MELLITUS, TYPE II (ICD-250.00) Patient was instructed to restart taking medication as directed. Will have her check blood glucose levels at least twice a day and bring meter with her for further evaluation to next appointment.   Her updated medication list  for this problem includes:    Metformin Hcl 500 Mg Tabs (Metformin hcl) .Marland Kitchen... Take one tab in the morning and two tabs at night.    Enalapril Maleate 10 Mg Tabs (Enalapril maleate) .Marland Kitchen... Take 1 tablet by mouth once a day    Aspirin Adult Low Strength 81 Mg Tbec (Aspirin) .Marland Kitchen... Take one tab by mouth daily.    Glucotrol Xl 10 Mg Xr24h-tab (Glipizide) .Marland Kitchen... Take 2 tablets by mouth once a day  Orders: T- Capillary Blood Glucose (16109) T-Hgb A1C (in-house) ([id number])  Labs Reviewed: Creat: 0.54 (03/19/2008)    Reviewed HgBA1c results: 8.3 (06/17/2009)  7.7 (03/15/2009)  Complete Medication List: 1)  Metformin Hcl 500 Mg Tabs (Metformin hcl) .... Take one tab in the morning and two tabs at night. 2)  Enalapril Maleate 10 Mg Tabs (Enalapril maleate) .... Take 1 tablet by mouth once a day 3)  Omeprazole 20 Mg Tbec (Omeprazole) .... Take 2 pills by mouth once a day 4)  Aspirin Adult Low Strength 81 Mg Tbec (Aspirin) .... Take one tab by mouth daily. 5)  Amitriptyline Hcl 10 Mg Tabs (Amitriptyline hcl) .... Take 1 tablet by mouth at bedtime x 7days then stop medication 6)  Glucotrol Xl 10 Mg Xr24h-tab (Glipizide) .... Take 2 tablets by mouth once a day 7)  Colace 100 Mg Caps (Docusate sodium) .... Take 1 tablet by mouth two times a day 8)  Claritin 10 Mg Tabs (Loratadine) .... Take 1 tablet by mouth once a day 9)  Ventolin Hfa 108 (90 Base) Mcg/act Aers (Albuterol sulfate) .... 2 puff inhaled every 4-6 hours as needed for wheezing or shortness of breath 10)  Neurontin 300 Mg Caps (Gabapentin) .... Take 1 tablet by mouth at bedtime  Patient Instructions: 1)  Please schedule a follow-up appointment in 1 month with PCP. 2)  Stop taking amitriptyline 25mg  tablets. Start amitriptyline taper: Take 10mg  tablet at betime for one week, then stop. 3)  Start taking Neurontin as prescribed. 4)  Go to get Pulmonary Function test for definitive diagnosis of asthma. 5)  Check blood sugars at least twice a day  and bring meter with you during your next clinic visit. 6)  You will be called with any abnormalities in the tests scheduled or performed today.  If you don't hear from [place] within a week from when the test was performed, you can assume that your test was  normal.  Prescriptions: NEURONTIN 300 MG CAPS (GABAPENTIN) Take 1 tablet by mouth at bedtime  #30 x 1   Entered and Authorized by:   Laren Everts MD   Signed by:   Laren Everts MD on 06/17/2009   Method used:   Electronically to        [facility] [phone]* (retail)       [address] [place]  [address]       Ph: [phone] or [phone]       Fax: [phone]   RxID:   [id number] AMITRIPTYLINE HCL 10 MG TABS (AMITRIPTYLINE HCL) Take 1 tablet by mouth at bedtime X 7days then stop medication  #7 x 0   Entered and Authorized by:   Laren Everts MD   Signed by:   Laren Everts MD on 06/17/2009   Method used:   Electronically to        [facility] [phone]* (retail)       [address] [place]  [address]       Ph: [phone] or [phone]       Fax: [phone]   RxID:   [id number] VENTOLIN HFA 108 (90 BASE) MCG/ACT AERS (ALBUTEROL SULFATE) 2 puff inhaled every 4-6 hours as needed for wheezing or shortness of breath  #1 x 6   Entered and Authorized by:   Laren Everts MD   Signed by:   Laren Everts MD on 06/17/2009   Method used:   [facility] to        [facility] [phone]* (retail)       [address] [place]  [address]       Ph: [phone] or [phone]       Fax: [phone]   RxID:   [id number]   Prevention & Chronic Care Immunizations   Influenza vaccine: Fluvax 3+  (04/30/2006)    Tetanus booster: Not documented    Pneumococcal vaccine: Not documented  Other Screening   Pap smear: Not documented    Mammogram: Normal  (11/08/2004)   Smoking status: quit  (06/17/2009)  Diabetes  Mellitus   HgbA1C: 8.3  (06/17/2009)    Eye exam: Not documented    Foot exam: yes- done, but problems with form prevented documentation on flowsheet  (03/19/2008)   High risk foot: no  (03/19/2008)   Foot care education: Not documented    Urine microalbumin/creatinine ratio: 9.9  (11/26/2006)  Lipids   Total Cholesterol: Not documented   LDL: Not documented   LDL Direct: Not documented   HDL: Not documented   Triglycerides: Not documented  Hypertension   Last Blood Pressure: 146 / 96  (06/17/2009)   Serum creatinine: 0.54  (03/19/2008)   Serum potassium 3.6  (03/19/2008)  Self-Management Support :    Patient will work on the following items until the next clinic visit to reach self-care goals:     Medications and monitoring: take my medicines every day, bring all of my medications to every visit  (06/17/2009)     Eating: eat more vegetables, use fresh or frozen vegetables, eat foods that are low in salt, eat baked foods instead of fried foods  (06/17/2009)     Other: dance team at church  (11/22/2008)    Diabetes self-management support: Written self-care plan, Education handout  (11/22/2008)    Hypertension  self-management support: Not documented    Laboratory Results   Blood Tests   Date/Time Received: June 17, 2009 4:09 PM  Date/Time Reported: Burke Keels  June 17, 2009 4:09 PM   HGBA1C: 8.3%   (Normal Range: Non-Diabetic - 3-6%   Control Diabetic - 6-8%) CBG Random:: 289mg /dL

## 2010-07-04 NOTE — Assessment & Plan Note (Signed)
Summary: ACUTE-HIP/LEG/PAIN/SWELLING/CFB   Vital Signs:  Patient profile:   40 year old female Height:      65 inches (165.10 cm) Weight:      292.6 pounds (133 kg) BMI:     48.87 Temp:     98.4 degrees F (36.89 degrees C) oral Pulse rate:   89 / minute BP sitting:   142 / 86  (left arm)  Vitals Entered By: Stanton Kidney Ditzler RN (November 02, 2009 9:36 AM) Is Patient Diabetic? Yes Did you bring your meter with you today? No Pain Assessment Patient in pain? yes     Location: hips,joints and left leg Intensity: 9 Type: throbbing Onset of pain  past 6 months Nutritional Status BMI of > 30 = obese Nutritional Status Detail appetite good CBG Result 115  Have you ever been in a relationship where you felt threatened, hurt or afraid?denies   Does patient need assistance? Functional Status Self care Ambulation Normal Comments Daughter with pt. Swelling  in feet. Complete form for social services for not working. ? x-rays of hips.   Primary Care Provider:  Deatra Robinson MD   History of Present Illness: [name] is a 40 yo lady with PMH as outlined in the EMR comes today for hip pain and b/l leg swelling. She also has pain in other joints in her legs and in her back as well. She was started on naproxen which she takes two times a day and it helps her a little bit, but it doesnot take her pain away. She also takes her neurontin. Her most painful joint is her left hip. The is worse when she stands and when she sits it started to feel numb and the pain goes to her thigh. No fever/chills. No trauma. She has morning stiffness that usually lasts throughout her day. She has no pain in her small joints of her hands. She has a small nodule on her right forearm and both hips and one on stomach.    She has chronic b/l lymphedema since the age of 40 yrs. Lately it has started to swell to the point that it is painful to wear her shoe. She also feels like her ankles are disconnected from her legs and  she tend to triple.   Depression History:      The patient denies a depressed mood most of the day and a diminished interest in her usual daily activities.         Preventive Screening-Counseling & Management  Alcohol-Tobacco     Smoking Status: quit     Year Quit: 1995  Caffeine-Diet-Exercise     Does Patient Exercise: yes     Type of exercise: WALKING     Times/week:   2  Current Medications (verified): 1)  Metformin Hcl 1000 Mg Tabs (Metformin Hcl) .... Take 1 Tablet By Mouth Two Times A Day 2)  Enalapril Maleate 10 Mg Tabs (Enalapril Maleate) .... Take 1 Tablet By Mouth Once A Day 3)  Omeprazole 20 Mg  Tbec (Omeprazole) .... Take 2 Pills By Mouth Once A Day 4)  Aspirin Adult Low Strength 81 Mg Tbec (Aspirin) .... Take One Tab By Mouth Daily. 5)  Amitriptyline Hcl 10 Mg Tabs (Amitriptyline Hcl) .... Take 1 Tablet By Mouth At Bedtime X 7days Then Stop Medication 6)  Glucotrol Xl 10 Mg Xr24h-Tab (Glipizide) .... Take 2 Tablets By Mouth Once A Day 7)  Colace 100 Mg Caps (Docusate Sodium) .... Take 1 Tablet By Mouth Two  Times A Day 8)  Claritin 10 Mg Tabs (Loratadine) .... Take 1 Tablet By Mouth Once A Day 9)  Ventolin Hfa 108 (90 Base) Mcg/act Aers (Albuterol Sulfate) .... 2 Puff Inhaled Every 4-6 Hours As Needed For Wheezing or Shortness of Breath 10)  Neurontin 300 Mg Caps (Gabapentin) .... Take 1 Tablet By Mouth At Bedtime 11)  Naproxen 250 Mg Tabs (Naproxen) .... Take 1 Tablet By Mouth Four Times A Day With Meal, As Needed For Pain  Allergies: No Known Drug Allergies  Review of Systems      See HPI  Physical Exam  General:  alert.   Mouth:  pharynx pink and moist.   Lungs:  normal breath sounds, no crackles, and no wheezes.   Heart:  normal rate, regular rhythm, no murmur, and no gallop.   Abdomen:  soft, non-tender, and normal bowel sounds.   Msk:  Left Hip and leg: there is mild tenderness on the left inguinal area close to hip joint, but it is more pronounced on the  upper lateral left thigh.  No back tenderness.   No tenderness/deformity/swelling of the small joints of the hands. No swelling/tenderness of b/l knees.  Extremities:  1+ left pedal edema and trace right pedal edema.   Neurologic:  alert & oriented X3.     Impression & Recommendations:  Problem # 1:  HIP PAIN, BILATERAL (ICD-719.45) I reviewed her last hip xray from 2007 and was negative. I will repeat it today and I asked her to take naproxen up to qid as needed as this helps for pain. I also asked her to take PPI.  Her updated medication list for this problem includes:    Aspirin Adult Low Strength 81 Mg Tbec (Aspirin) .Marland Kitchen... Take one tab by mouth daily.    Naproxen 250 Mg Tabs (Naproxen) .Marland Kitchen... Take 1 tablet by mouth four times a day with meal, as needed for pain  Orders: Diagnostic X-Ray/Fluoroscopy (Diagnostic X-Ray/Flu)  Problem # 2:  POLYARTHROPATHY, INFLAMMATORY NOS (ICD-714.9) I will check followings. She doesnot have any deformity, but reports morning stiffness that lasts throughout her day.  Orders: T-CRP (C-Reactive Protein) (09811) Antinuclear Antib (ANA) [id number]) Rheumatoid Factor ([id number]) T-Sed Rate (Automated) ([id number])  Complete Medication List: 1)  Metformin Hcl 1000 Mg Tabs (Metformin hcl) .... Take 1 tablet by mouth two times a day 2)  Enalapril Maleate 10 Mg Tabs (Enalapril maleate) .... Take 1 tablet by mouth once a day 3)  Omeprazole 20 Mg Tbec (Omeprazole) .... Take 2 pills by mouth once a day 4)  Aspirin Adult Low Strength 81 Mg Tbec (Aspirin) .... Take one tab by mouth daily. 5)  Amitriptyline Hcl 10 Mg Tabs (Amitriptyline hcl) .... Take 1 tablet by mouth at bedtime x 7days then stop medication 6)  Glucotrol Xl 10 Mg Xr24h-tab (Glipizide) .... Take 2 tablets by mouth once a day 7)  Colace 100 Mg Caps (Docusate sodium) .... Take 1 tablet by mouth two times a day 8)  Claritin 10 Mg Tabs (Loratadine) .... Take 1 tablet by mouth once a day 9)   Ventolin Hfa 108 (90 Base) Mcg/act Aers (Albuterol sulfate) .... 2 puff inhaled every 4-6 hours as needed for wheezing or shortness of breath 10)  Neurontin 300 Mg Caps (Gabapentin) .... Take 1 tablet by mouth at bedtime 11)  Naproxen 250 Mg Tabs (Naproxen) .... Take 1 tablet by mouth four times a day with meal, as needed for pain  Other Orders: Capillary Blood Glucose/CBG (24401) T-Comprehensive  Metabolic Panel [id number]) T-TSH [id number]) T-Lipid Profile [id number]) T-Antinuclear Antib (ANA) [phone]) T-Rheumatoid Factor [phone])  Patient Instructions: 1)  Please schedule a follow-up appointment in 1 month. 2)  Limit your Sodium (Salt) to less than 2 grams a day(slightly less than 1/2 a teaspoon) to prevent fluid retention, swelling, or worsening of symptoms. 3)  It is important that you exercise regularly at least 20 minutes 5 times a week. If you develop chest pain, have severe difficulty breathing, or feel very tired , stop exercising immediately and seek medical attention. 4)  You need to lose weight. Consider a lower calorie diet and regular exercise.  5)  Check your Blood Pressure regularly. If it is above: you should make an appointment. Process Orders Check Orders Results:     [facility]: ABN not required for this insurance Tests Sent for requisitioning (November 02, 2009 6:25 PM):     11/02/2009: [facility] -- T-Comprehensive Metabolic Panel [[id number]] (signed)     11/02/2009: [facility] -- T-TSH [id number] (signed)     11/02/2009: [facility] -- T-Lipid Profile [phone] (signed)     11/02/2009: [facility] -- T-CRP (C-Reactive Protein) [[id number]] (signed)     11/02/2009: [facility] -- T-Sed Rate (Automated) [phone] (signed)     11/02/2009: [facility] -- T-Antinuclear Antib (ANA) [[id number]] (signed)     11/02/2009: [facility] -- T-Rheumatoid Factor [phone] (signed)    Prevention & Chronic Care Immunizations   Influenza vaccine: Fluvax 3+  (04/30/2006)   Influenza vaccine deferral: Deferred  (08/29/2009)    Tetanus booster: Not documented    Pneumococcal vaccine: Not documented  Other Screening   Pap smear: Not documented    Mammogram: Normal  (11/08/2004)   Smoking status: quit  (11/02/2009)  Diabetes Mellitus   HgbA1C: 8.1  (08/29/2009)    Eye exam: Not documented    Foot exam: yes- done, but problems with form prevented documentation on flowsheet  (03/19/2008)   High risk foot: no  (03/19/2008)   Foot care education: Not documented    Urine microalbumin/creatinine ratio: 9.9  (11/26/2006)  Lipids   Total Cholesterol: Not documented   LDL: Not documented   LDL Direct: Not documented   HDL: Not documented   Triglycerides: Not documented  Hypertension   Last Blood Pressure: 142 / 86  (11/02/2009)   Serum creatinine: 0.54  (08/29/2009)   Serum potassium 4.1  (08/29/2009) CMP ordered   Self-Management Support :   Personal Goals (by the next clinic visit) :     Personal A1C goal: 7  (08/29/2009)     Personal blood pressure goal: 130/80  (08/29/2009)     Personal LDL goal: 100  (08/29/2009)    Patient will work on the following items until the next clinic visit to reach self-care goals:     Medications and monitoring: take my medicines every day, check my blood sugar, bring all of my medications to every visit, examine my feet every day  (11/02/2009)     Eating: drink diet soda or water instead of juice or soda, eat more vegetables, use fresh or frozen vegetables, eat fruit for snacks and desserts  (11/02/2009)     Activity: take a 30 minute walk every day, park at the far end of the parking lot  (11/02/2009)     Other: dance team at church  (11/22/2008)    Diabetes self-management support: Written self-care plan, Education handout, Resources for patients handout   (  11/02/2009)   Diabetes care plan printed   Diabetes education handout printed    Hypertension self-management support: Written self-care plan, Education handout, Resources for patients handout  (11/02/2009)   Hypertension self-care plan printed.   Hypertension education handout printed      Resource handout printed.  Process Orders Check Orders Results:     [facility]: ABN not required for this insurance Tests Sent for requisitioning (November 02, 2009 6:25 PM):     11/02/2009: [facility] -- T-Comprehensive Metabolic Panel [[id number]] (signed)     11/02/2009: [facility] -- T-TSH [id number] (signed)     11/02/2009: [facility] -- T-Lipid Profile [phone] (signed)     11/02/2009: [facility] -- T-CRP (C-Reactive Protein) [[id number]] (signed)     11/02/2009: [facility] -- T-Sed Rate (Automated) [phone] (signed)     11/02/2009: [facility] -- T-Antinuclear Antib (ANA) [[id number]] (signed)     11/02/2009: [facility] -- T-Rheumatoid Factor [phone] (signed)

## 2010-07-04 NOTE — Letter (Signed)
Summary: PHYSICIAN ORDERS DIABETIC SHOE/INSERTS  PHYSICIAN ORDERS DIABETIC SHOE/INSERTS   Imported By: Margie Billet 09/08/2009 11:09:04  _____________________________________________________________________  External Attachment:    Type:   Image     Comment:   External Document

## 2010-07-04 NOTE — Progress Notes (Signed)
Summary: Letter/ Stockings  Phone Note Call from Patient   Summary of Call: Call from pt  said that she was to call about some fitted. stockings   York Spaniel that she is to get a letter for [facility].  Gets out of class at 1 PM.  Will call when she call back.Angelina Ok RN  January 03, 2010 11:44 AM  Initial call taken by: Angelina Ok RN,  January 03, 2010 11:44 AM  Follow-up for Phone Call        Provider Notified Follow-up by: Deatra Robinson MD,  January 04, 2010 2:33 PM

## 2010-07-04 NOTE — Progress Notes (Signed)
Summary: phone/gg  Phone Note From Pharmacy   Summary of Call: Received a call from [facility] with [facility].  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 Initial call taken by: Merrie Roof RN,  January 04, 2010 2:40 PM  Follow-up for Phone Call        Called Patient, Getting authorization from insurer Follow-up by: Deatra Robinson MD,  January 04, 2010 3:27 PM

## 2010-07-04 NOTE — Assessment & Plan Note (Signed)
Summary: FU VISIT/DS   Vital Signs:  Patient profile:   40 year old female Height:      65 inches (165.10 cm) Weight:      290.9 pounds (132.23 kg) BMI:     48.58 Temp:     98.1 degrees F rectal Pulse rate:   92 / minute BP sitting:   126 / 82  (right arm)  Vitals Entered By: Chinita Pester RN (January 17, 2010 2:51 PM) CC: F/U  on compression stockings; has not picked up stockings at this time. BLE's swollen. Is Patient Diabetic? Yes Did you bring your meter with you today? No Pain Assessment Patient in pain? yes     Location: feet/legs Intensity: 9 Type: sharp Onset of pain  Intermittent Nutritional Status BMI of > 30 = obese CBG Result 117  Have you ever been in a relationship where you felt threatened, hurt or afraid?No   Does patient need assistance? Functional Status Self care Ambulation Normal   Diabetic Foot Exam Last Podiatry Exam Date: 01/17/2010  Foot Inspection Is there a history of a foot ulcer?              No Is there a foot ulcer now?              No Can the patient see the bottom of their feet?          No Are the shoes appropriate in style and fit?          Yes Is there swelling or an abnormal foot shape?          Yes Are the toenails long?                No Are the toenails thick?                No Are the toenails ingrown?              No Is there heavy callous build-up?              No  Diabetic Foot Care Education Pulse Check          Right Foot          Left Foot Dorsalis Pedis:        diminished            diminished Comments: BLE's edema esp. right.   10-g (5.07) Semmes-Weinstein Monofilament Test Performed by: Chinita Pester RN          Right Foot          Left Foot Visual Inspection     normal         normal Test Control      normal         normal Site 1         normal         normal Site 2         normal         normal Site 3         normal         normal Site 4         normal         normal Site 5         normal          normal Site 6         normal         normal Site  7         normal         normal Site 8         normal         normal Site 9         normal         normal  Impression      normal         normal   CC:  F/U  on compression stockings; has not picked up stockings at this time. BLE's swollen..  Depression History:      The patient denies a depressed mood most of the day and a diminished interest in her usual daily activities.         Preventive Screening-Counseling & Management  Alcohol-Tobacco     Alcohol drinks/day: 0     Smoking Status: quit     Year Quit: 1995  Caffeine-Diet-Exercise     Does Patient Exercise: yes     Type of exercise: WALKING     Times/week:   2  Allergies: No Known Drug Allergies  Diabetes Management Exam:    Foot Exam (with socks and/or shoes not present):       Sensory-Monofilament:          Left foot: normal          Right foot: normal   Impression & Recommendations: Patient was not seen. Spoke with [employer]. No charge for this visit.  Complete Medication List: 1)  Metformin Hcl 1000 Mg Tabs (Metformin hcl) .... Take 1 tablet by mouth two times a day 2)  Enalapril Maleate 10 Mg Tabs (Enalapril maleate) .... Take 1 tablet by mouth once a day 3)  Omeprazole 20 Mg Tbec (Omeprazole) .... Take 2 pills by mouth once a day 4)  Aspirin Adult Low Strength 81 Mg Tbec (Aspirin) .... Take one tab by mouth daily. 5)  Amitriptyline Hcl 10 Mg Tabs (Amitriptyline hcl) .... Take 1 tablet by mouth at bedtime x 7days then stop medication 6)  Glucotrol Xl 10 Mg Xr24h-tab (Glipizide) .... Take 2 tablets by mouth once a day 7)  Colace 100 Mg Caps (Docusate sodium) .... Take 1 tablet by mouth two times a day 8)  Claritin 10 Mg Tabs (Loratadine) .... Take 1 tablet by mouth once a day 9)  Ventolin Hfa 108 (90 Base) Mcg/act Aers (Albuterol sulfate) .... 2 puff inhaled every 4-6 hours as needed for wheezing or shortness of breath 10)  Neurontin 300 Mg Caps (Gabapentin) ....  Take 1 tablet by mouth at bedtime 11)  Naproxen 250 Mg Tabs (Naproxen) .... Take 1 tablet by mouth four times a day with meal, as needed for pain 12)  Tramadol Hcl 50 Mg Tabs (Tramadol hcl) .... Take 1 tablet by mouth four times a day as needed  Other Orders: T- Capillary Blood Glucose (16109) T-Hgb A1C (in-house) (60454UJ) No Charge Patient Arrived (NCPA0) (NCPA0)  Prevention & Chronic Care Immunizations   Influenza vaccine: Fluvax 3+  (04/30/2006)   Influenza vaccine deferral: Deferred  (08/29/2009)    Tetanus booster: Not documented    Pneumococcal vaccine: Not documented  Other Screening   Pap smear: Not documented    Mammogram: Normal  (11/08/2004)   Smoking status: quit  (01/17/2010)  Diabetes Mellitus   HgbA1C: 7.0  (01/17/2010)    Eye exam: Not documented    Foot exam: yes  (01/17/2010)   Foot exam action/deferral: Do today   High risk foot: no  (  03/19/2008)   Foot care education: Not documented    Urine microalbumin/creatinine ratio: 9.9  (11/26/2006)  Lipids   Total Cholesterol: 138  (11/02/2009)   LDL: 83  (11/02/2009)   LDL Direct: Not documented   HDL: 32  (11/02/2009)   Triglycerides: 113  (11/02/2009)  Hypertension   Last Blood Pressure: 126 / 82  (01/17/2010)   Serum creatinine: 0.53  (11/02/2009)   Serum potassium 4.1  (11/02/2009)  Self-Management Support :   Personal Goals (by the next clinic visit) :     Personal A1C goal: 7  (08/29/2009)     Personal blood pressure goal: 130/80  (08/29/2009)     Personal LDL goal: 100  (08/29/2009)    Diabetes self-management support: Written self-care plan, Education handout, Resources for patients handout  (11/02/2009)    Hypertension self-management support: Written self-care plan, Education handout, Resources for patients handout  (11/02/2009)   Nursing Instructions: Diabetic foot exam today     Laboratory Results   Blood Tests   Date/Time Received: January 17, 2010 3:06 PM  Date/Time  Reported: Burke Keels  January 17, 2010 3:07 PM   HGBA1C: 7.0%   (Normal Range: Non-Diabetic - 3-6%   Control Diabetic - 6-8%) CBG Random:: 117mg /dL

## 2010-07-04 NOTE — Progress Notes (Signed)
  Phone Note Outgoing Call   Call placed by: Soc. Work Emergency planning/management officer of Call: Ann called at [facility] to tell me that they had a pair of stockings for $70 in stock and that the patient can come for pick up.   Will call patient and let her know.

## 2010-07-04 NOTE — Progress Notes (Signed)
  Phone Note Outgoing Call   Call placed by: Soc. Work Emergency planning/management officer of Call: Lianne Cure at [employer] who said they will look at her legs and feet and try and help figure out best course of action.  patient may need to go back for leg wrappings before being fitted with compression stockings.  Patient will go to [facility] this PM and I will call Ann in the AM to find out what happened.     Follow-up for Phone Call        Called patient and left message for her to go back to [facility] for final fitting and order.  Dorothe Pea  January 09, 2010 12:54 PM

## 2010-07-04 NOTE — Progress Notes (Signed)
  Phone Note Call from Patient   Caller: Patient Call For: Sharon Robinson MD Summary of Call: Patient was seen on 06/01/2011by Dr. Aleene Davidson and presented paperwork.  Patient was unable to sch with you on your sch and was concerned.  Her paperwork needed to be filled out ASAP.  She has been out of work and will be getting some assistance if her paperwork is completed quickly.  Paperwork needs to indicate whether she is okay to work part-time or full-time. Initial call taken by: Shon Hough,  November 07, 2009 11:12 AM  Follow-up for Phone Call        Called the patient. will fill out the paperwork this afternoon. Patient is to come and pick up the paperwork. Follow-up by: Sharon Robinson MD,  November 08, 2009 1:26 PM

## 2010-07-04 NOTE — Assessment & Plan Note (Signed)
Summary: TB TEST FOR A JOB/SB.  Nurse Visit   Allergies: No Known Drug Allergies  Immunizations Administered:  PPD Skin Test:    Vaccine Type: PPD    Site: right forearm    Mfr: Sanofi Pasteur    Dose: 0.1 ml    Route: ID    Given by: Chinita Pester RN    Exp. Date: 04/06/2011    Lot #: [id number]  PPD Results    Date of reading: 02/17/2010    Results: < 5mm    Interpretation: negative  Orders Added: 1)  TB Skin Test [86580] 2)  Admin 1st Vaccine [19509]

## 2010-07-04 NOTE — Assessment & Plan Note (Signed)
Summary: EST-CK/FU/MEDS/CFB   Vital Signs:  Patient profile:   40 year old female Height:      65 inches (165.10 cm) Weight:      278.7 pounds (126.68 kg) BMI:     46.55 Temp:     98.5 degrees F oral Pulse rate:   93 / minute BP sitting:   143 / 85  (right arm)  Vitals Entered By: Chinita Pester RN (March 14, 2010 4:10 PM) CC: Physical for her job. Restless leg at night. Is Patient Diabetic? Yes Did you bring your meter with you today? No Pain Assessment Patient in pain? yes     Location: feet Intensity: 8 Type: "shooting" Onset of pain  Intermittent; wearing support knne hi Nutritional Status BMI of > 30 = obese CBG Result 230  Have you ever been in a relationship where you felt threatened, hurt or afraid?No   Does patient need assistance? Functional Status Self care Ambulation Normal   Primary Care Provider:  Deatra Layann Bluett MD  CC:  Physical for her job. Restless leg at night..  History of Present Illness: 1.Patient is here for a physical exam_>applying for a job at the [employer]. PPD test was already done and is negative. 2. HTN --restarted her meds ("now can afford them." Denies any HA, dizziness, CP, or SOB. 3. Bilateral LE lyphedema. Obtained compression stockings --decrease in Sx.   Depression History:      The patient denies a depressed mood most of the day and a diminished interest in her usual daily activities.         Preventive Screening-Counseling & Management  Alcohol-Tobacco     Alcohol drinks/day: 0     Smoking Status: quit     Year Quit: 1995  Caffeine-Diet-Exercise     Does Patient Exercise: yes     Type of exercise: WALKING/dancing     Times/week:   2  Current Problems (verified): 1)  Preventive Health Care  (ICD-V70.0) 2)  Sinusitis  (ICD-473.9) 3)  Diabetes Mellitus, Type II  (ICD-250.00) 4)  Hypertension  (ICD-401.9) 5)  Asthma  (ICD-493.90) 6)  Hip Pain, Bilateral  (ICD-719.45) 7)  Rlq Pain  (ICD-789.03) 8)   Fracture, Clavicle, Right  (ICD-810.00) 9)  Upper Respiratory Infection, Viral  (ICD-465.9) 10)  Lipoma of Other Skin and Subcutaneous Tissue  (ICD-214.1) 11)  Neuropathy  (ICD-355.9) 12)  Palpitations  (ICD-785.1) 13)  Gerd  (ICD-530.81) 14)  Irregular Menstruation  (ICD-626.4) 15)  Polyarthropathy, 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 Take 1 Tablet By Mouth Two Times A Day 2)  Enalapril Maleate 10 Mg Tabs (Enalapril Maleate) .... Take 1 Tablet By Mouth Once A Day 3)  Omeprazole 20 Mg  Tbec (Omeprazole) .... Take 2 Pills By Mouth Once A Day 4)  Aspirin Adult Low Strength 81 Mg Tbec (Aspirin) .... Take One Tab By Mouth Daily. 5)  Amitriptyline Hcl 10 Mg Tabs (Amitriptyline Hcl) .... Take 1 Tablet By Mouth At Bedtime X 7days Then Stop Medication 6)  Glucotrol Xl 10 Mg Xr24h-Tab (Glipizide) .... Take 2 Tablets By Mouth Once A Day 7)  Colace 100 Mg Caps (Docusate Sodium) .... Take 1 Tablet By Mouth Two Times A Day 8)  Claritin 10 Mg Tabs (Loratadine) .... Take 1 Tablet By Mouth  Once A Day 9)  Ventolin Hfa 108 (90 Base) Mcg/act Aers (Albuterol Sulfate) .... 2 Puff Inhaled Every 4-6 Hours As Needed For Wheezing or Shortness of Breath 10)  Neurontin 300 Mg Caps (Gabapentin) .... Take 1 Tablet By Mouth At Bedtime 11)  Naproxen 250 Mg Tabs (Naproxen) .... Take 1 Tablet By Mouth Four Times A Day With Meal, As Needed For Pain 12)  Tramadol Hcl 50 Mg Tabs (Tramadol Hcl) .... Take 1 Tablet By Mouth Four Times A Day As Needed  Current Medications (verified): 1)  Metformin Hcl 1000 Mg Tabs (Metformin Hcl) .... Take 1 Tablet By Mouth Two Times A Day 2)  Enalapril Maleate 10 Mg Tabs (Enalapril Maleate) .... Take 1 Tablet By Mouth Once A Day 3)  Omeprazole 20 Mg  Tbec (Omeprazole) .... Take 2 Pills  By Mouth Once A Day 4)  Aspirin Adult Low Strength 81 Mg Tbec (Aspirin) .... Take One Tab By Mouth Daily. 5)  Amitriptyline Hcl 10 Mg Tabs (Amitriptyline Hcl) .... Take 1 Tablet By Mouth At Bedtime X 7days Then Stop Medication 6)  Glucotrol Xl 10 Mg Xr24h-Tab (Glipizide) .... Take 2 Tablets By Mouth Once A Day 7)  Colace 100 Mg Caps (Docusate Sodium) .... Take 1 Tablet By Mouth Two Times A Day 8)  Claritin 10 Mg Tabs (Loratadine) .... Take 1 Tablet By Mouth Once A Day 9)  Ventolin Hfa 108 (90 Base) Mcg/act Aers (Albuterol Sulfate) .... 2 Puff Inhaled Every 4-6 Hours As Needed For Wheezing or Shortness of Breath 10)  Neurontin 300 Mg Caps (Gabapentin) .... Take 1 Tablet By Mouth At Bedtime 11)  Naproxen 250 Mg Tabs (Naproxen) .... Take 1 Tablet By Mouth Four Times A Day With Meal, As Needed For Pain 12)  Tramadol Hcl 50 Mg Tabs (Tramadol Hcl) .... 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    Risk Factors: Alcohol Use: 0 (03/14/2010) Exercise:  yes (03/14/2010)  Risk Factors: Smoking Status: quit (03/14/2010)  Review of Systems  The patient denies anorexia, fever, weight loss, weight gain, vision loss, decreased hearing, hoarseness, chest pain, syncope, dyspnea on exertion, peripheral edema, prolonged cough, headaches, hemoptysis, abdominal pain, melena, hematochezia, severe indigestion/heartburn, hematuria, incontinence, genital sores, muscle weakness, suspicious skin lesions, transient blindness, difficulty walking, depression, unusual weight change, abnormal bleeding, enlarged lymph nodes, angioedema, and breast masses.    Physical Exam  General:  Well-developed,well-nourished,in no acute distress; alert,appropriate and cooperative throughout examination Head:  normocephalic and atraumatic.   tender over frontal and maxillary sinuses Eyes:  No corneal or conjunctival inflammation noted. EOMI. Perrla. Funduscopic exam benign, without hemorrhages, exudates or papilledema. Vision  grossly normal. Ears:  External ear exam shows no significant lesions or deformities.  Otoscopic examination reveals clear canals, tympanic membranes are intact bilaterally without bulging, retraction, inflammation or discharge. Hearing is grossly normal bilaterally. Nose:  External nasal examination shows no deformity or inflammation. Nasal mucosa are pink and moist without lesions or exudates. Mouth:  Oral mucosa and oropharynx without lesions or exudates.  Teeth in good repair. Neck:  No deformities, masses, or tenderness noted. Lungs:  Normal respiratory effort, chest expands symmetrically. Lungs are clear to auscultation, no crackles or wheezes. Heart:  Normal rate and regular rhythm. S1 and S2 normal without gallop, murmur, click, rub or other extra sounds. Abdomen:  Bowel sounds positive,abdomen soft and non-tender without masses, organomegaly or hernias noted. Msk:  No deformity or scoliosis noted of thoracic or lumbar spine.   Pulses:  R and L  carotid,radial,femoral,dorsalis pedis and posterior tibial pulses are full and equal bilaterally Extremities:  1+ left pedal edema and 1+ right pedal edema.  Patient wears compression stockings. Neurologic:  No cranial nerve deficits noted. Station and gait are normal. Plantar reflexes are down-going bilaterally. DTRs are symmetrical throughout. Sensory, motor and coordinative functions appear intact. Skin:  Intact without suspicious lesions or rashes Cervical Nodes:  No lymphadenopathy noted Axillary Nodes:  No palpable lymphadenopathy Inguinal Nodes:  No significant adenopathy Psych:  Cognition and judgment appear intact. Alert and cooperative with normal attention span and concentration. No apparent delusions, illusions, hallucinations   Impression & Recommendations:  Problem # 1:  DIABETES MELLITUS, TYPE II (ICD-250.00) Assessment Unchanged Patient lost 12 lbs. Watches her diet. Continue to encourage weight managment and exercise. Adherance with a Tx plan is emphasized. foot care reviewed. Her updated medication list for this problem includes:    Metformin Hcl 1000 Mg Tabs (Metformin hcl) .Marland Kitchen... Take 1 tablet by mouth two times a day    Enalapril Maleate 10 Mg Tabs (Enalapril maleate) .Marland Kitchen... Take 1 tablet by mouth once a day    Aspirin Adult Low Strength 81 Mg Tbec (Aspirin) .Marland Kitchen... Take one tab by mouth daily.    Glucotrol Xl 10 Mg Xr24h-tab (Glipizide) .Marland Kitchen... Take 2 tablets by mouth once a day  Orders: Capillary Blood Glucose/CBG (16109) Ophthalmology Referral (Ophthalmology) T-Urine Microalbumin w/creat. ratio [phone])  Labs Reviewed: Creat: 0.53 (11/02/2009)    Reviewed HgBA1c results: 7.0 (01/17/2010)  8.1 (08/29/2009)  Problem # 2:  HYPERTENSION (ICD-401.9) Assessment: Unchanged  Patient  is now able to afford her medications. Low salt diet; CV exercise discussed.  Will recheck her BP in one week. Her updated medication list for this problem includes:    Enalapril  Maleate 10 Mg Tabs (Enalapril maleate) .Marland Kitchen... Take 1 tablet by mouth once a day  BP today: 143/85 Prior BP: 126/82 (01/17/2010)  Labs Reviewed: K+: 4.1 (11/02/2009) Creat: : 0.53 (11/02/2009)   Chol: 138 (11/02/2009)   HDL: 32 (11/02/2009)   LDL: 83 (11/02/2009)   TG: 113 (11/02/2009)  Orders: 12 Lead EKG (12 Lead EKG)  Problem # 3:  PREVENTIVE HEALTH CARE (ICD-V70.0) Assessment: Comment Only  Patient refused WWE today. Will schedule a separate appointment. Flu and Pneumo vaccination give.  EKG done today. PPD neg (checked at the previous visit). Referral for an Eye exam. diet, exercise, seatbelt, safety discussed.  Orders: 12 Lead EKG (12 Lead EKG)  Problem # 4:  LYMPHEDEMA (ICD-457.1) Assessment: Improved Improved with compression stockings. No change in managment for now.  Complete Medication List: 1)  Metformin Hcl 1000 Mg Tabs (Metformin  hcl) .... Take 1 tablet by mouth two times a day 2)  Enalapril Maleate 10 Mg Tabs (Enalapril maleate) .... Take 1 tablet by mouth once a day 3)  Omeprazole 20 Mg Tbec (Omeprazole) .... Take 2 pills by mouth once a day 4)  Aspirin Adult Low Strength 81 Mg Tbec (Aspirin) .... Take one tab by mouth daily. 5)  Amitriptyline Hcl 10 Mg Tabs (Amitriptyline hcl) .... Take 1 tablet by mouth at bedtime x 7days then stop medication 6)  Glucotrol Xl 10 Mg Xr24h-tab (Glipizide) .... Take 2 tablets by mouth once a day 7)  Colace 100 Mg Caps (Docusate sodium) .... Take 1 tablet by mouth two times a day 8)  Claritin 10 Mg Tabs (Loratadine) .... Take 1 tablet by mouth once a day 9)  Ventolin Hfa 108 (90 Base) Mcg/act Aers (Albuterol sulfate) .... 2 puff inhaled every 4-6 hours as needed for wheezing or shortness of breath 10)  Neurontin 300 Mg Caps (Gabapentin) .... Take 1 tablet by mouth at bedtime 11)  Naproxen 250 Mg Tabs (Naproxen) .... Take 1 tablet by mouth four times a day with meal, as needed for pain 12)  Tramadol Hcl 50 Mg Tabs (Tramadol hcl)  .... Take 1 tablet by mouth four times a day as needed  Other Orders: Pneumococcal Vaccine (60454) Admin 1st Vaccine (09811) Influenza Vaccine NON MCR (91478) Mammogram (Screening) (Mammo)  Patient Instructions: 1)  Please, return for a Well woman exam. 2)  Take ALL Medications as prescribed. 3)  Call with any questions. 4)  Please, follow up in 4 months or sooner if needed.  Prevention & Chronic Care Immunizations   Influenza vaccine: Fluvax Non-MCR  (03/14/2010)   Influenza vaccine deferral: Deferred  (08/29/2009)   Influenza vaccine due: 02/03/2011    Tetanus booster: Not documented   Tetanus booster due: 03/05/2016    Pneumococcal vaccine: Pneumovax  (03/14/2010)   Pneumococcal vaccine due: 02/25/2034  Other Screening   Pap smear: Not documented    Mammogram: Normal  (11/08/2004)   Mammogram action/deferral: Ordered  (03/14/2010)   Mammogram due: 11/08/2005   Smoking status: quit  (03/14/2010)  Diabetes Mellitus   HgbA1C: 7.0  (01/17/2010)   Hemoglobin A1C due: 07/20/2010    Eye exam: Not documented   Diabetic eye exam action/deferral: Ophthalmology referral  (03/14/2010)    Foot exam: yes  (01/17/2010)   Foot exam action/deferral: Do today   High risk foot: no  (03/19/2008)   Foot care education: Not documented    Urine microalbumin/creatinine ratio: 9.9  (11/26/2006)   Urine microalbumin action/deferral: Ordered   Urine microalbumin/cr due: 03/15/2011    Diabetes flowsheet reviewed?: Yes   Progress toward A1C goal: At goal    Stage of readiness to change (diabetes management): Maintenance  Lipids   Total Cholesterol: 138  (11/02/2009)   LDL: 83  (11/02/2009)   LDL Direct: Not documented   HDL: 32  (11/02/2009)   Triglycerides: 113  (11/02/2009)   Lipid panel due: 03/15/2011  Hypertension   Last Blood Pressure: 143 / 85  (03/14/2010)   Serum creatinine: 0.53  (11/02/2009)   Serum potassium 4.1  (11/02/2009)    Hypertension flowsheet  reviewed?: Yes   Progress toward BP goal: Deteriorated    Stage of readiness to change (hypertension management): Action  Self-Management Support :   Personal Goals (by the next clinic visit) :     Personal A1C goal: 7  (08/29/2009)     Personal blood pressure goal: 130/80  (  08/29/2009)     Personal LDL goal: 100  (08/29/2009)    Patient will work on the following items until the next clinic visit to reach self-care goals:     Medications and monitoring: take my medicines every day, bring all of my medications to every visit, examine my feet every day  (03/14/2010)     Eating: drink diet soda or water instead of juice or soda, eat more vegetables, use fresh or frozen vegetables, eat foods that are low in salt  (03/14/2010)     Activity: take a 30 minute walk every day, park at the far end of the parking lot  (11/02/2009)     Other: dance team at church  (11/22/2008)    Diabetes self-management support: Written self-care plan  (03/14/2010)   Diabetes care plan printed    Hypertension self-management support: Written self-care plan  (03/14/2010)   Hypertension self-care plan printed.   Nursing Instructions: Give Flu vaccine today Give Pneumovax today Schedule screening mammogram (see order) Refer for screening diabetic eye exam (see order)   Process Orders Check Orders Results:     [facility]: ABN not required for this insurance Tests Sent for requisitioning (March 15, 2010 8:34 AM):     03/14/2010: [facility] -- T-Urine Microalbumin w/creat. ratio [[id number]] (signed)      Pneumovax Vaccine    Vaccine Type: Pneumovax    Site: left deltoid    Mfr: Merck    Dose: 0.5 ml    Route: IM    Given by: Chinita Pester RN    Exp. Date: 08/17/2011    Lot #: [id number]    VIS given: 05/09/09 version given March 14, 2010.  Influenza Vaccine    Vaccine Type: Fluvax Non-MCR    Site: right deltoid    Mfr: GlaxoSmithKline    Dose: 0.5 ml     Route: IM    Given by: Chinita Pester RN    Exp. Date: 12/02/2010    Lot #: [id number]    VIS given: 12/27/09 version given March 14, 2010.  Flu Vaccine Consent Questions    Do you have a history of severe allergic reactions to this vaccine? no    Any prior history of allergic reactions to egg and/or gelatin? no    Do you have a sensitivity to the preservative Thimersol? no    Do you have a past history of Guillan-Barre Syndrome? no    Do you currently have an acute febrile illness? no    Have you ever had a severe reaction to latex? no    Vaccine information given and explained to patient? yes    Are you currently pregnant? no

## 2010-07-04 NOTE — Progress Notes (Signed)
Summary: refill/gg  Phone Note Refill Request  on September 27, 2009 10:46 AM  Refills Requested: Medication #1:  NEURONTIN 300 MG CAPS Take 1 tablet by mouth at bedtime   Last Refilled: 07/29/2009  Medication #2:  COLACE 100 MG CAPS Take 1 tablet by mouth two times a day  Method Requested: Electronic Initial call taken by: Merrie Roof RN,  September 27, 2009 10:47 AM  Follow-up for Phone Call        Refill approved-nurse to complete Follow-up by: Deatra Robinson MD,  September 30, 2009 4:09 PM    Prescriptions: NEURONTIN 300 MG CAPS (GABAPENTIN) Take 1 tablet by mouth at bedtime  #30 x 3   Entered and Authorized by:   Deatra Robinson MD   Signed by:   Deatra Robinson MD on 09/30/2009   Method used:   Electron[facility] to        [facility] [phone]* (retail)       [address] [place]  [address]       Ph: [phone] or [phone]       Fax: [phone]   RxID:   [id number] COLACE 100 MG CAPS (DOCUSATE SODIUM) Take 1 tablet by mouth two times a day  #60 x 3   Entered and Authorized by:   Deatra Robinson MD   Signed by:   Deatra Robinson MD on 09/30/2009   Method used:   Electronically to        [facility] [phone]* (retail)       [address]       [place], [place]  [address]       Ph: [phone] or [phone]       Fax: [phone]   RxID:   [id number]

## 2010-07-04 NOTE — Miscellaneous (Signed)
Summary: Orders Update  Clinical Lists Changes  Orders: Added new Referral order of Social Work Referral (Social ) - Signed Added new Referral order of Social Work Referral (Social ) - Signed 

## 2010-07-04 NOTE — Progress Notes (Signed)
Summary: refill/ hla  Phone Note Refill Request Message from:  Fax from Pharmacy on August 30, 2009 11:34 AM  Refills Requested: Medication #1:  GLUCOTROL XL 10 MG XR24H-TAB Take 2 tablets by mouth once a day   Dosage confirmed as above?Dosage Confirmed   Last Refilled: 2/18  Method Requested: Electronic Initial call taken by: Marin Roberts RN,  August 30, 2009 11:34 AM    Prescriptions: CLARITIN 10 MG TABS (LORATADINE) Take 1 tablet by mouth once a day  #30 x 2   Entered and Authorized by:   Deatra Robinson MD   Signed by:   Deatra Robinson MD on 08/31/2009   Method used:   Electronically to        [facility] [phone]* (retail)       [address], [place]  [address]       Ph: [phone] or [phone]       Fax: [phone]   RxID:   [id number] GLUCOTROL XL 10 MG XR24H-TAB (GLIPIZIDE) Take 2 tablets by mouth once a day  #60 x 3   Entered and Authorized by:   Deatra Robinson MD   Signed by:   Deatra Robinson MD on 08/31/2009   Method used:   [facility]ronically to        [facility] [phone]* (retail)       [address], [place]  [address]       Ph: [phone] or [phone]       Fax: [phone]   RxID:   [id number] ASPIRIN ADULT LOW STRENGTH 81 MG TBEC (ASPIRIN) Take one tab by mouth daily.  #30 x 1   Entered and Authorized by:   Deatra Robinson MD   Signed by:   Deatra Robinson MD on 08/31/2009   Method used:   Electronically to        [facility] [phone]* (retail)       [address], [place]  [address]       Ph: [phone] or [phone]       Fax: [phone]   RxID:   [id number] ENALAPRIL MALEATE 10 MG TABS (ENALAPRIL MALEATE) Take 1 tablet by mouth once a day  #31 Tablet x 5   Entered and Authorized by:   Deatra Robinson MD   Signed by:   Deatra Robinson MD on 08/31/2009   Method used:   [facility]ron[facility] to        [facility] [phone]* (retail)       [address]       [place], [place]   [address]       Ph: [phone] or [phone]       Fax: [phone]   RxID:   [id number]

## 2010-07-04 NOTE — Letter (Signed)
Summary: [facility]: CMN  [facility]: CMN   Imported By: Florinda Marker 06/21/2009 10:00:18  _____________________________________________________________________  External Attachment:    Type:   Image     Comment:   External Document

## 2010-07-04 NOTE — Medication Information (Signed)
Summary: COMPRESSION STOCKING  COMPRESSION STOCKING   Imported By: Margie Billet 02/15/2010 15:58:02  _____________________________________________________________________  External Attachment:    Type:   Image     Comment:   External Document

## 2010-07-04 NOTE — Progress Notes (Signed)
Summary: Soc. 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 Work Emergency planning/management officer of Call: Called patient again to go and pick up her stockings at [employer]   patient said she would pick up today after 1:00.  Called Ann at [facility] who is aware she will be coming to try on.  Dewayne Hatch will give her the stockings provided they fit and then call me to pay.  Patient will can pickup stockings without upfront payment.      Appended Document: Soc. Work The patient has picked up her stockings and [facility] was hand-delivered the payment.

## 2010-07-04 NOTE — Assessment & Plan Note (Signed)
Summary: ACUTE-SINUS/HEADACHE/LEFT HIP HURTING/Sharon Fisher/CFB   Vital Signs:  Patient profile:   40 year old female Height:      65 inches (165.10 cm) Weight:      292.4 pounds (130.05 kg) BMI:     48.83 Temp:     98.3 degrees F (36.83 degrees C) oral Pulse rate:   86 / minute BP sitting:   148 / 94  (left arm) Cuff size:   large  Vitals Entered By: Theotis Barrio NT II (August 29, 2009 2:10 PM) CC: LEFT HIP PAIN  -  LOWER BACK PAIN -  SINUS  /  MEDICATION REFILL  Is Patient Diabetic? Yes Did you bring your meter with you today? No Pain Assessment Patient in pain? yes     Location: LEFT HIP/ LOWER BACK Intensity:     8  Type: ACHE/SHARP Onset of pain  FOR ABOUT 3 WEEKS Nutritional Status BMI of > 30 = obese CBG Result 155  Have you ever been in a relationship where you felt threatened, hurt or afraid?No   Does patient need assistance? Functional Status Self care Ambulation Normal Comments LEFT HIP PAIN / LOWER BACK PAIN  /  SINUS  /  MEDICATION REFILL   Primary Care Provider:  Deatra Robinson Fisher  CC:  LEFT HIP PAIN  -  LOWER BACK PAIN -  SINUS  /  MEDICATION REFILL .  History of Present Illness: Sharon Fisher is a 40 year old Female with PMH/problems as outlined in the EMR, who presents to the [facility] with chief complaint(s) of:    -- sinus problem: has headache, nosal congestion and had one episode of nose bleed. Sneezing a lot, no fevers. It feels worse than her allergic rhinitis. Got some OTC meds that did not help her much. Feeling dry in her throat.   -- Joint pains: has had joint pain for a long time and now left hip pain is worse than usual, ongoing and aggravating a lot. Tried some tylenol but not helping her much.     Preventive Screening-Counseling & Management  Alcohol-Tobacco     Smoking Status: quit     Year Quit: 1995  Caffeine-Diet-Exercise     Does Patient Exercise: yes     Type of exercise: WALKING     Times/week:   2  Current Medications  (verified): 1)  Metformin Hcl 500 Mg Tabs (Metformin Hcl) .... Take One Tab in The Morning and Two Tabs At Night. 2)  Enalapril Maleate 10 Mg Tabs (Enalapril Maleate) .... Take 1 Tablet By Mouth Once A Day 3)  Omeprazole 20 Mg  Tbec (Omeprazole) .... Take 2 Pills By Mouth Once A Day 4)  Aspirin Adult Low Strength 81 Mg Tbec (Aspirin) .... Take One Tab By Mouth Daily. 5)  Amitriptyline Hcl 10 Mg Tabs (Amitriptyline Hcl) .... Take 1 Tablet By Mouth At Bedtime X 7days Then Stop Medication 6)  Glucotrol Xl 10 Mg Xr24h-Tab (Glipizide) .... Take 2 Tablets By Mouth Once A Day 7)  Colace 100 Mg Caps (Docusate Sodium) .... Take 1 Tablet By Mouth Two Times A Day 8)  Claritin 10 Mg Tabs (Loratadine) .... Take 1 Tablet By Mouth Once A Day 9)  Ventolin Hfa 108 (90 Base) Mcg/act Aers (Albuterol Sulfate) .... 2 Puff Inhaled Every 4-6 Hours As Needed For Wheezing or Shortness of Breath 10)  Neurontin 300 Mg Caps (Gabapentin) .... Take 1 Tablet By Mouth At Bedtime 11)  Amoxicillin 500 Mg Caps (Amoxicillin) .... Take 1 Capsule  By Mouth Three Times A Day For Seven Days 12)  Naproxen 250 Mg Tabs (Naproxen) .... 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    Risk Factors: Exercise: yes (08/29/2009)  Risk Factors: Smoking Status: quit (08/29/2009)  Review of Systems       as per HPI  Physical Exam  General:  alert and overweight-appearing.   Head:  normocephalic and atraumatic.   tender over frontal and maxillary sinuses Eyes:  pupils round and pupils reactive to light.   Ears:  normal ear drums bilat Nose:  congestion bilaterally Mouth:  pharynx pink and moist, no erythema, and no exudates.   Neck:  supple no lymph nodes Lungs:  normal respiratory effort and normal breath sounds.   Heart:  normal rate and regular rhythm.   Abdomen:  soft and non-tender.   Msk:  mildly tender left hip joint, normal range of motion, no swelling / redness.  Pulses:  normal peripheral pulses  Extremities:  no cyanosis, clubbing or edema  Neurologic:  non focal.  Psych:  Oriented X3 and normally interactive.     Impression & Recommendations:  Problem # 1:  SINUSITIS (ICD-473.9) Hisotry/Exam consistent with sinusitis. Will treat her with a course of amoxicillin   Her updated medication list for this problem includes:    Amoxicillin 500 Mg Caps (Amoxicillin) .Marland Kitchen... Take 1 capsule by mouth three times a day for seven days  Problem # 2:  HYPERTENSION (ICD-401.9) 148/94 today. Patient in pain and discomfort, won't make any changes today.  Her updated medication list for this problem includes:    Enalapril Maleate 10 Mg Tabs (Enalapril maleate) .Marland Kitchen... Take 1 tablet by mouth once a day  Her updated medication list for this problem includes:    Enalapril Maleate 10 Mg Tabs (Enalapril maleate) .Marland Kitchen... Take 1 tablet by mouth once a day  Orders: T-Basic Metabolic Panel [id number]) T-Hgb A1C (in-house) ([id number])  Problem # 3:  DIABETES MELLITUS, TYPE II (ICD-250.00) No  changes made today. A1c: 8.3 (06/17/2009 2:55:34 PM), metformin restarted at a higher dose in Jan, will check A1c today.  MICROALB/CR: 9.9 (11/26/2006 8:41:00 PM) FOOT: yes- done, but problems with form prevented documentation on flowsheet (03-27-08 2:38:36 PM) WEIGHT: 48.83 (08/29/2009 1:58:05 PM)   Her updated medication list for this problem includes:    Metformin Hcl 500 Mg Tabs (Metformin hcl) .Marland Kitchen... Take one tab in the morning and two tabs at night.    Enalapril Maleate 10 Mg Tabs (Enalapril maleate) .Marland Kitchen... Take 1 tablet by mouth once a day    Aspirin Adult Low Strength 81 Mg Tbec (Aspirin) .Marland Kitchen... Take one tab by mouth  daily.    Glucotrol Xl 10 Mg Xr24h-tab (Glipizide) .Marland Kitchen... Take 2 tablets by mouth once a day  Orders: T-Basic Metabolic Panel [id number]) T-Hgb A1C (in-house) ([id number]) Capillary Blood Glucose/CBG (54270)  Problem # 4:  HIP PAIN, BILATERAL (ICD-719.45) This is chronic with worsening pain on left side. Exam was essentially normal. I advised patient to lose weight, remain as active as possible and use pain meds as needed.   Her updated medication list for this problem includes:    Aspirin Adult Low Strength 81 Mg Tbec (Aspirin) .Marland Kitchen... Take one tab by mouth daily.    Naproxen 250 Mg Tabs (Naproxen) .Marland Kitchen... Take 1 tablet by mouth three times a day with meal, as needed for pain  Complete Medication List: 1)  Metformin Hcl 500 Mg Tabs (Metformin hcl) .... Take one tab in the morning and two tabs at night. 2)  Enalapril Maleate 10 Mg Tabs (Enalapril maleate) .... Take 1 tablet by mouth once a day 3)  Omeprazole 20 Mg Tbec (Omeprazole) .... Take 2 pills by mouth once a day 4)  Aspirin Adult Low Strength 81 Mg Tbec (Aspirin) .... Take one tab by mouth daily. 5)  Amitriptyline Hcl 10 Mg Tabs (Amitriptyline hcl) .... Take 1 tablet by mouth at bedtime x 7days then stop medication 6)  Glucotrol Xl 10 Mg Xr24h-tab (Glipizide) .... Take 2 tablets by mouth once a day 7)  Colace 100 Mg  Caps (Docusate sodium) .... Take 1 tablet by mouth two times a day 8)  Claritin 10 Mg Tabs (Loratadine) .... Take 1 tablet by mouth once a day 9)  Ventolin Hfa 108 (90 Base) Mcg/act Aers (Albuterol sulfate) .... 2 puff inhaled every 4-6 hours as needed for wheezing or shortness of breath 10)  Neurontin 300 Mg Caps (Gabapentin) .... Take 1 tablet by mouth at bedtime 11)  Amoxicillin 500 Mg Caps (Amoxicillin) .... Take 1 capsule by mouth three times a day for seven days 12)  Naproxen 250 Mg Tabs (Naproxen) .... Take 1 tablet by mouth three times a day with meal, as needed for pain  Patient Instructions: 1)  Please schedule a follow-up appointment in 1 month. 2)  Do let us know if your problem worsens.   3)  We will let you know if anything wrong with your lab work.   Prescriptions: NAPROXEN 250 MG TABS (NAPROXEN) Take 1 tablet by mouth three times a day with meal, as needed for pain  #21 x 5   Entered and Authorized by:   Zara Council Fisher   Signed by:   Zara Council Fisher on 08/29/2009   Method used:   [facility]ronically to        [facility] [phone]* (retail)       [address], [place]  [address]       Ph: [phone] or [phone]       Fax: [phone]   RxID:   [id number] AMOXICILLIN 500 MG CAPS (AMOXICILLIN) Take 1 capsule by mouth three times a day for seven days  #21 x 0   Entered and Authorized by:   Zara Council Fisher   Signed by:   Zara Council Fisher on 08/29/2009   Method used:   [facility] to        [facility] [phone]* (retail)       [address]       [place], [place]  [address]  Ph: [phone] or [phone]       Fax: [phone]   RxID:   [id number]  Process Orders Check Orders Results:     [facility]: ABN not required for this insurance Tests Sent for requisitioning (August 29, 2009 2:46 PM):     08/29/2009: [facility] -- T-Basic Metabolic Panel [phone] (signed)    Prevention & Chronic  Care Immunizations   Influenza vaccine: Fluvax 3+  (04/30/2006)   Influenza vaccine deferral: Deferred  (08/29/2009)    Tetanus booster: Not documented    Pneumococcal vaccine: Not documented  Other Screening   Pap smear: Not documented    Mammogram: Normal  (11/08/2004)   Smoking status: quit  (08/29/2009)  Diabetes Mellitus   HgbA1C: 8.3  (06/17/2009)    Eye exam: Not documented    Foot exam: yes- done, but problems with form prevented documentation on flowsheet  (03/19/2008)   High risk foot: no  (03/19/2008)   Foot care education: Not documented    Urine microalbumin/creatinine ratio: 9.9  (11/26/2006)    Diabetes flowsheet reviewed?: Yes   Progress toward A1C goal: Unchanged  Lipids   Total Cholesterol: Not documented   LDL: Not documented   LDL Direct: Not documented   HDL: Not documented   Triglycerides: Not documented  Hypertension   Last Blood Pressure: 148 / 94  (08/29/2009)   Serum creatinine: 0.54  (03/19/2008)   Serum potassium 3.6  (03/19/2008)    Hypertension flowsheet reviewed?: Yes   Progress toward BP goal: Unchanged  Self-Management Support :   Personal Goals (by the next clinic visit) :     Personal A1C goal: 7  (08/29/2009)     Personal blood pressure goal: 130/80  (08/29/2009)     Personal LDL goal: 100  (08/29/2009)    Patient will work on the following items until the next clinic visit to reach self-care goals:     Medications and monitoring: take my medicines every day, check my blood sugar, bring all of my medications to every visit, examine my feet every day  (08/29/2009)     Eating: drink diet soda or water instead of juice or soda, eat more vegetables, use fresh or frozen vegetables, eat baked foods instead of fried foods, eat fruit for snacks and desserts, limit or avoid alcohol  (08/29/2009)     Activity: take the stairs instead of the elevator, park at the far end of the parking lot  (08/29/2009)     Other: dance team at church   (11/22/2008)    Diabetes self-management support: Resources for patients handout  (08/29/2009)    Hypertension self-management support: Resources for patients handout  (08/29/2009)      Resource handout printed.   Appended Document: Lab Order/a1c results    Lab Visit  Laboratory Results   Blood Tests   Date/Time Received: August 29, 2009 2:52 PM Date/Time Reported: Alric Quan  August 29, 2009 2:52 PM  HGBA1C: 8.1%   (Normal Range: Non-Diabetic - 3-6%   Control Diabetic - 6-8%)    Orders Today:   Appended Document: ACUTE-SINUS/HEADACHE/LEFT HIP HURTING/Sharon Fisher/CFB    Clinical Lists Changes  Medications: Changed medication from METFORMIN HCL 500 MG TABS (METFORMIN HCL) Take one tab in the morning and two tabs at night. to METFORMIN HCL 1000 MG TABS (METFORMIN HCL) Take 1 tablet by mouth two times a day - Signed Rx of METFORMIN HCL 1000 MG TABS (METFORMIN HCL) Take 1 tablet by mouth two times a day;  #60  x 6;  Signed;  Entered by: Zara Council Fisher;  Authorized by: Zara Council Fisher;  Method used: [facility] [phone]*, [address] [place], [place]  [address], Ph: [phone] or [phone], Fax: [phone]    Prescriptions: METFORMIN HCL 1000 MG TABS (METFORMIN HCL) Take 1 tablet by mouth two times a day  #60 x 6   Entered and Authorized by:   Zara Council Fisher   Signed by:   Zara Council Fisher on 08/29/2009   Method used:   [facility] to        [facility] [phone]* (retail)       [address] [place]  [address]       Ph: [phone] or [phone]       Fax: [phone]   RxID:   [id number]     Impression & Recommendations:  Problem # 1:  DIABETES MELLITUS, TYPE II (ICD-250.00) Elevated A1c, will change metformin to 1000 two times a day.  Her updated medication list for this problem includes:    Metformin Hcl 1000 Mg Tabs (Metformin hcl) .Marland Kitchen... Take 1 tablet by mouth two times a day    Enalapril Maleate 10 Mg  Tabs (Enalapril maleate) .Marland Kitchen... Take 1 tablet by mouth once a day    Aspirin Adult Low Strength 81 Mg Tbec (Aspirin) .Marland Kitchen... Take one tab by mouth daily.    Glucotrol Xl 10 Mg Xr24h-tab (Glipizide) .Marland Kitchen... Take 2 tablets by mouth once a day   Complete Medication List: 1)  Metformin Hcl 1000 Mg Tabs (Metformin hcl) .... Take 1 tablet by mouth two times a day 2)  Enalapril Maleate 10 Mg Tabs (Enalapril maleate) .... Take 1 tablet by mouth once a day 3)  Omeprazole 20 Mg Tbec (Omeprazole) .... Take 2 pills by mouth once a day 4)  Aspirin Adult Low Strength 81 Mg Tbec (Aspirin) .... Take one tab by mouth daily. 5)  Amitriptyline Hcl 10 Mg Tabs (Amitriptyline hcl) .... Take 1 tablet by mouth at bedtime x 7days then stop medication 6)  Glucotrol Xl 10 Mg Xr24h-tab (Glipizide) .... Take 2 tablets by mouth once a day 7)  Colace 100 Mg Caps (Docusate sodium) .... Take 1 tablet by mouth two times a day 8)  Claritin 10 Mg Tabs (Loratadine) .... Take 1 tablet by mouth once a day 9)  Ventolin Hfa 108 (90 Base) Mcg/act Aers (Albuterol sulfate) .... 2 puff inhaled every 4-6 hours as needed for wheezing or shortness of breath 10)  Neurontin 300 Mg Caps (Gabapentin) .... Take 1 tablet by mouth at bedtime 11)  Amoxicillin 500 Mg Caps (Amoxicillin) .... Take 1 capsule by mouth three times a day for seven days 12)  Naproxen 250 Mg Tabs (Naproxen) .... Take 1 tablet by mouth three times a day with meal, as needed for pain  Appended Document: ACUTE-SINUS/HEADACHE/LEFT HIP HURTING/Sharon Fisher/CFB    Clinical Lists Changes        Impression & Recommendations:  Problem # 1:  DIABETES MELLITUS, TYPE II (ICD-250.00) Changed metformin to 1000 bid but unable to reach to patient on the number given. Will continue to try.   Her updated medication list for this problem includes:    Metformin Hcl 1000 Mg Tabs (Metformin hcl) .Marland Kitchen... Take 1 tablet by mouth two times a day    Enalapril Maleate 10 Mg Tabs (Enalapril  maleate) .Marland Kitchen... Take 1 tablet by mouth once a day  Aspirin Adult Low Strength 81 Mg Tbec (Aspirin) .Marland Kitchen... Take one tab by mouth daily.    Glucotrol Xl 10 Mg Xr24h-tab (Glipizide) .Marland Kitchen... Take 2 tablets by mouth once a day   Complete Medication List: 1)  Metformin Hcl 1000 Mg Tabs (Metformin hcl) .... Take 1 tablet by mouth two times a day 2)  Enalapril Maleate 10 Mg Tabs (Enalapril maleate) .... Take 1 tablet by mouth once a day 3)  Omeprazole 20 Mg Tbec (Omeprazole) .... Take 2 pills by mouth once a day 4)  Aspirin Adult Low Strength 81 Mg Tbec (Aspirin) .... Take one tab by mouth daily. 5)  Amitriptyline Hcl 10 Mg Tabs (Amitriptyline hcl) .... Take 1 tablet by mouth at bedtime x 7days then stop medication 6)  Glucotrol Xl 10 Mg Xr24h-tab (Glipizide) .... Take 2 tablets by mouth once a day 7)  Colace 100 Mg Caps (Docusate sodium) .... Take 1 tablet by mouth two times a day 8)  Claritin 10 Mg Tabs (Loratadine) .... Take 1 tablet by mouth once a day 9)  Ventolin Hfa 108 (90 Base) Mcg/act Aers (Albuterol sulfate) .... 2 puff inhaled every 4-6 hours as needed for wheezing or shortness of breath 10)  Neurontin 300 Mg Caps (Gabapentin) .... Take 1 tablet by mouth at bedtime 11)  Amoxicillin 500 Mg Caps (Amoxicillin) .... Take 1 capsule by mouth three times a day for seven days 12)  Naproxen 250 Mg Tabs (Naproxen) .... Take 1 tablet by mouth three times a day with meal, as needed for pain

## 2010-07-04 NOTE — Assessment & Plan Note (Signed)
Summary: Social Work   Social Work Evaluation Date  01/02/2010 Patient name Sharon Fisher  Primary MD   : Deatra Robinson MD Social Worker's name : Dorothe Pea MSW- LCSW  Home [phone]  Work phone: [phone]  Cell phone: .  Marland Kitchen     Alternate phone: . Marland Kitchen       Individual making referral: Dr. Denton Meek  Primary Reason for Referral:     Other Comments Compression Stockings.  Action taken by Social Work: Met with patient who went to [facility] and could not afford $100 plus cost of compression stockings.  She has Medicaid but there is no coverage for compression stockings.  I will work with [employer] and help her pay for stockings via our fund.

## 2010-07-04 NOTE — Progress Notes (Signed)
Summary: refill/ hla  Phone Note Refill Request Message from:  Fax from Pharmacy on June 20, 2009 5:58 PM  Refills Requested: Medication #1:  COLACE 100 MG CAPS Take 1 tablet by mouth two times a day   Last Refilled: 11/26 Initial call taken by: Marin Roberts RN,  June 20, 2009 5:59 PM  Follow-up for Phone Call        Refill approved-nurse to complete Follow-up by: Julaine Fusi  DO,  June 21, 2009 9:55 AM    Prescriptions: COLACE 100 MG CAPS (DOCUSATE SODIUM) Take 1 tablet by mouth two times a day  #60 x 3   Entered and Authorized by:   Julaine Fusi  DO   Signed by:   Julaine Fusi  DO on 06/21/2009   Method used:   [facility]        [facility] [phone]* (retail)       [address]       [place], [place]  [address]       Ph: [phone] or [phone]       Fax: [phone]   RxID:   [id number]

## 2010-07-04 NOTE — Assessment & Plan Note (Signed)
Summary: est-ck/fu/meds/cfb   Vital Signs:  Patient profile:   40 year old female Height:      65 inches Weight:      285.2 pounds BMI:     47.63 Temp:     98.4 degrees F oral Pulse rate:   82 / minute BP sitting:   134 / 88  (right arm)  Vitals Entered By: Filomena Jungling NT II (January 02, 2010 4:48 PM) CC: FOLLOWUP VISIT Is Patient Diabetic? Yes Did you bring your meter with you today? No Pain Assessment Patient in pain? yes     Location: bilateral feet Intensity: 8 Type: aching Onset of pain  Chronic Nutritional Status BMI of > 30 = obese  Have you ever been in a relationship where you felt threatened, hurt or afraid?No   Does patient need assistance? Functional Status Self care Ambulation Normal   Primary Care Provider:  Deatra Robinson MD  CC:  FOLLOWUP VISIT.  History of Present Illness: C/O increase in swelling of her both leg. Hx of lyphedema since age 33 y/o. Had a work up in the past but could not afford the SCD's. Denies CP, SOB, orthopnea, PND or any other Sx.   Preventive Screening-Counseling & Management  Alcohol-Tobacco     Smoking Status: quit     Year Quit: 1995  Caffeine-Diet-Exercise     Does Patient Exercise: yes     Type of exercise: WALKING     Times/week:   2  Problems Prior to Update: 1)  Preventive Health Care  (ICD-V70.0) 2)  Sinusitis  (ICD-473.9) 3)  Diabetes Mellitus, Type II  (ICD-250.00) 4)  Hypertension  (ICD-401.9) 5)  Asthma  (ICD-493.90) 6)  Hip Pain, Bilateral  (ICD-719.45) 7)  Rlq Pain  (ICD-789.03) 8)  Fracture, Clavicle, Right  (ICD-810.00) 9)  Upper Respiratory Infection, Viral  (ICD-465.9) 10)  Lipoma of Other Skin and Subcutaneous Tissue  (ICD-214.1) 11)  Neuropathy  (ICD-355.9) 12)  Palpitations  (ICD-785.1) 13)  Gerd  (ICD-530.81) 14)  Irregular Menstruation  (ICD-626.4) 15)  Polyarthropathy, 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) 16)  Ovarian Cyst  (ICD-620.2) 17)  Abnormal Mammogram  (ICD-793.80) 18)  Lymphedema   (ICD-457.1) 19)  Allergic Rhinitis  (ICD-477.9)  Current Problems (verified): 1)  Preventive Health Care  (ICD-V70.0) 2)  Sinusitis  (ICD-473.9) 3)  Diabetes Mellitus, Type II  (ICD-250.00) 4)  Hypertension  (ICD-401.9) 5)  Asthma  (ICD-493.90) 6)  Hip Pain, Bilateral  (ICD-719.45) 7)  Rlq Pain  (ICD-789.03) 8)  Fracture, Clavicle, Right  (ICD-810.00) 9)  Upper Respiratory Infection, Viral  (ICD-465.9) 10)  Lipoma of Other Skin and Subcutaneous Tissue  (ICD-214.1) 11)  Neuropathy  (ICD-355.9) 12)  Palpitations  (ICD-785.1) 13)  Gerd  (ICD-530.81) 14)  Irregular Menstruation  (ICD-626.4) 15)  Polyarthropathy, 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 Take 1 Tablet By Mouth Two Times A Day 2)  Enalapril Maleate 10 Mg Tabs (Enalapril Maleate) .... Take 1 Tablet By Mouth Once A Day 3)  Omeprazole 20 Mg  Tbec (Omeprazole) .... Take 2 Pills By Mouth Once A Day 4)  Aspirin Adult Low Strength 81 Mg Tbec (Aspirin) .... Take One Tab By Mouth Daily. 5)  Amitriptyline Hcl 10 Mg Tabs (Amitriptyline Hcl) .... Take 1 Tablet By Mouth At Bedtime X 7days Then  Stop Medication 6)  Glucotrol Xl 10 Mg Xr24h-Tab (Glipizide) .... Take 2 Tablets By Mouth Once A Day 7)  Colace 100 Mg Caps (Docusate Sodium) .... Take 1 Tablet By Mouth Two Times A Day 8)  Claritin 10 Mg Tabs (Loratadine) .... Take 1 Tablet By Mouth Once A Day 9)  Ventolin Hfa 108 (90 Base) Mcg/act Aers (Albuterol Sulfate) .... 2 Puff Inhaled Every 4-6 Hours As Needed For Wheezing or Shortness of Breath 10)  Neurontin 300 Mg Caps (Gabapentin) .... Take 1 Tablet By Mouth At Bedtime 11)  Naproxen 250 Mg Tabs (Naproxen) .... Take 1 Tablet By Mouth Four Times A Day With Meal, As Needed For Pain  Current Medications (verified): 1)  Metformin Hcl  1000 Mg Tabs (Metformin Hcl) .... Take 1 Tablet By Mouth Two Times A Day 2)  Enalapril Maleate 10 Mg Tabs (Enalapril Maleate) .... Take 1 Tablet By Mouth Once A Day 3)  Omeprazole 20 Mg  Tbec (Omeprazole) .... Take 2 Pills By Mouth Once A Day 4)  Aspirin Adult Low Strength 81 Mg Tbec (Aspirin) .... Take One Tab By Mouth Daily. 5)  Amitriptyline Hcl 10 Mg Tabs (Amitriptyline Hcl) .... Take 1 Tablet By Mouth At Bedtime X 7days Then Stop Medication 6)  Glucotrol Xl 10 Mg Xr24h-Tab (Glipizide) .... Take 2 Tablets By Mouth Once A Day 7)  Colace 100 Mg Caps (Docusate Sodium) .... Take 1 Tablet By Mouth Two Times A Day 8)  Claritin 10 Mg Tabs (Loratadine) .... Take 1 Tablet By Mouth Once A Day 9)  Ventolin Hfa 108 (90 Base) Mcg/act Aers (Albuterol Sulfate) .... 2 Puff Inhaled Every 4-6 Hours As Needed For Wheezing or Shortness of Breath 10)  Neurontin 300 Mg Caps (Gabapentin) .... Take 1 Tablet By Mouth At Bedtime 11)  Naproxen 250 Mg Tabs (Naproxen) .... Take 1 Tablet By Mouth Four Times A Day With Meal, As Needed For Pain 12)  Tramadol Hcl 50 Mg Tabs (Tramadol Hcl) .... 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    Risk Factors: Exercise: yes (01/02/2010)  Risk Factors: Smoking Status: quit (01/02/2010)  Family History: Reviewed history from 04-13-2008 and no changes required. Mom- died of breast cancer, HTN, anemia NOS Aunt- breast cancer Grandmother- lung cancer, HTN, DM Sister - sickle cell trait  Social History: Reviewed history from 04-13-2008 and no changes required. No smoke, drink, or illicit drug use. 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    Review of Systems       per HPI  Physical Exam  General:  Well-developed,well-nourished,in no acute distress; alert,appropriate  and cooperative throughout examination Head:  normocephalic and atraumatic.   tender over frontal and maxillary sinuses Eyes:  No corneal or conjunctival inflammation noted. EOMI. Perrla. Funduscopic exam benign, without hemorrhages, exudates or papilledema. Vision grossly normal. Ears:  External ear exam shows no significant lesions or deformities.  Otoscopic examination reveals clear canals, tympanic membranes are intact bilaterally without bulging, retraction, inflammation or discharge. Hearing is grossly normal bilaterally. Nose:  External nasal examination shows no deformity or inflammation. Nasal mucosa are pink and moist without lesions or exudates. Mouth:  Oral mucosa and oropharynx without lesions or exudates.  Teeth in good repair. Neck:  No deformities, masses, or tenderness noted. Lungs:  Normal respiratory effort, chest expands symmetrically. Lungs are clear to auscultation, no crackles or wheezes. Heart:  Normal rate and regular rhythm. S1 and S2 normal without gallop, murmur, click, rub or other extra sounds. Abdomen:  Bowel sounds positive,abdomen soft and non-tender without masses, organomegaly or  hernias noted. Msk:  No deformity or scoliosis noted of thoracic or lumbar spine.   Pulses:  R and L carotid,radial,femoral,dorsalis pedis and posterior tibial pulses are full and equal bilaterally Extremities:  2+ left pedal edema and 2+ right pedal edema.   Neurologic:  No cranial nerve deficits noted. Station and gait are normal. Plantar reflexes are down-going bilaterally. DTRs are symmetrical throughout. Sensory, motor and coordinative functions appear intact. Skin:  Intact without suspicious lesions or rashes Cervical Nodes:  No lymphadenopathy noted Psych:  Cognition and judgment appear intact. Alert and cooperative with normal attention span and concentration. No apparent delusions, illusions, hallucinations   Impression & Recommendations:  Problem # 1:  DIABETES MELLITUS, TYPE II (ICD-250.00) Will have fasting blood work in 2 weeks at the time of a follow up.  Weight managment discussed. Her updated medication list for this problem includes:    Metformin Hcl 1000 Mg Tabs (Metformin hcl) .Marland Kitchen... Take 1 tablet by mouth two times a day    Enalapril Maleate 10 Mg Tabs (Enalapril maleate) .Marland Kitchen... Take 1 tablet by mouth once a day    Aspirin Adult Low Strength 81 Mg Tbec (Aspirin) .Marland Kitchen... Take one tab by mouth daily.    Glucotrol Xl 10 Mg Xr24h-tab (Glipizide) .Marland Kitchen... Take 2 tablets by mouth once a day  Problem # 2:  HIP PAIN, BILATERAL (ICD-719.45) Likely Her updated medication list for this problem includes:    Aspirin Adult Low Strength 81 Mg Tbec (Aspirin) .Marland Kitchen... Take one tab by mouth daily.    Naproxen 250 Mg Tabs (Naproxen) .Marland Kitchen... Take 1 tablet by mouth four times a day with meal, as needed for pain    Tramadol Hcl 50 Mg Tabs (Tramadol hcl) .Marland Kitchen... Take 1 tablet by mouth four times a day as needed  Complete Medication List: 1)  Metformin Hcl 1000 Mg Tabs (Metformin hcl) .... Take 1 tablet by mouth two times a day 2)  Enalapril Maleate 10 Mg Tabs (Enalapril maleate) .... Take 1 tablet by  mouth once a day 3)  Omeprazole 20 Mg Tbec (Omeprazole) .... Take 2 pills by mouth once a day 4)  Aspirin Adult Low Strength 81 Mg Tbec (Aspirin) .... Take one tab by mouth daily. 5)  Amitriptyline Hcl 10 Mg Tabs (Amitriptyline hcl) .... Take 1 tablet by mouth at bedtime x 7days then stop medication 6)  Glucotrol Xl 10 Mg Xr24h-tab (Glipizide) .... Take 2 tablets by mouth once a day 7)  Colace 100 Mg Caps (Docusate sodium) .... Take 1 tablet by mouth two  times a day 8)  Claritin 10 Mg Tabs (Loratadine) .... Take 1 tablet by mouth once a day 9)  Ventolin Hfa 108 (90 Base) Mcg/act Aers (Albuterol sulfate) .... 2 puff inhaled every 4-6 hours as needed for wheezing or shortness of breath 10)  Neurontin 300 Mg Caps (Gabapentin) .... Take 1 tablet by mouth at bedtime 11)  Naproxen 250 Mg Tabs (Naproxen) .... Take 1 tablet by mouth four times a day with meal, as needed for pain 12)  Tramadol Hcl 50 Mg Tabs (Tramadol hcl) .... Take 1 tablet by mouth four times a day as needed  Patient Instructions: 1)  pleae, call Ms. Mamie tomorrow for instructions as discussed in r/g to your stockings. 2)  Follow up in 2 weeks. 3)  Call with any questions. Prescriptions: TRAMADOL HCL 50 MG TABS (TRAMADOL HCL) Take 1 tablet by mouth four times a day as needed  #120 x 6   Entered and Authorized by:   Deatra Robinson MD   Signed by:   Deatra Robinson MD on 01/02/2010   Method used:   Electronically to        [facility]. [phone]* (retail)       [address], [place]  [address]       Ph: [phone] or [phone]       Fax: [phone]   RxID:   [id number]   Prevention & Chronic Care Immunizations   Influenza vaccine: Fluvax 3+  (04/30/2006)   Influenza vaccine deferral: Deferred  (08/29/2009)    Tetanus booster: Not documented    Pneumococcal vaccine: Not documented  Other Screening   Pap smear: Not documented    Mammogram: Normal  (11/08/2004)   Smoking status: quit   (01/02/2010)  Diabetes Mellitus   HgbA1C: 8.1  (08/29/2009)    Eye exam: Not documented    Foot exam: yes- done, but problems with form prevented documentation on flowsheet  (03/19/2008)   High risk foot: no  (03/19/2008)   Foot care education: Not documented    Urine microalbumin/creatinine ratio: 9.9  (11/26/2006)  Lipids   Total Cholesterol: 138  (11/02/2009)   LDL: 83  (11/02/2009)   LDL Direct: Not documented   HDL: 32  (11/02/2009)   Triglycerides: 113  (11/02/2009)  Hypertension   Last Blood Pressure: 134 / 88  (01/02/2010)   Serum creatinine: 0.53  (11/02/2009)   Serum potassium 4.1  (11/02/2009)  Self-Management Support :   Personal Goals (by the next clinic visit) :     Personal A1C goal: 7  (08/29/2009)     Personal blood pressure goal: 130/80  (08/29/2009)     Personal LDL goal: 100  (08/29/2009)    Patient will work on the following items until the next clinic visit to reach self-care goals:     Medications and monitoring: take my medicines every day  (01/02/2010)     Eating: drink diet soda or water instead of juice or soda, eat more vegetables, use fresh or frozen vegetables, eat fruit for snacks and desserts  (11/02/2009)     Activity: take a 30 minute walk every day, park at the far end of the parking lot  (11/02/2009)     Other: dance team at church  (11/22/2008)    Diabetes self-management support: Written self-care plan, Education handout, Resources for patients handout  (11/02/2009)    Hypertension self-management support: Written self-care plan, Education handout, Resources for patients handout  (  11/02/2009)  

## 2010-07-06 ENCOUNTER — Other Ambulatory Visit: Payer: Self-pay | Admitting: Internal Medicine

## 2010-07-06 DIAGNOSIS — Z1239 Encounter for other screening for malignant neoplasm of breast: Secondary | ICD-10-CM

## 2010-07-06 DIAGNOSIS — Z1231 Encounter for screening mammogram for malignant neoplasm of breast: Secondary | ICD-10-CM

## 2010-07-06 NOTE — Miscellaneous (Signed)
Summary: Job Letter  Patient was evaluated at the clinic on 03/14/2010. Physical exam did not reveal any concers that might interefere with her job performanace as long as the patient is compliant with her physician's recommendations.  Patient is up-to-date on her immunizations. PPD test was negative. Please, feel free to contact me for any additional information if necessary.  Sincerely, Deatra Robinson, MD

## 2010-07-07 NOTE — Miscellaneous (Signed)
Summary: [facility]  [facility]   Imported By: Margie Billet 12/12/2009 14:42:05  _____________________________________________________________________  External Attachment:    Type:   Image     Comment:   External Document

## 2010-07-12 NOTE — Progress Notes (Signed)
Summary: multiple refill request/ hla  Phone Note Other Incoming   Summary of Call: the nurses cannot answer the refill requests that are electronic, and for whatever reason on this system we may recieve several requests even if the md denies the request or adds a message for the pharm to give the pt so as much as we would like not to do so we have to forward these request to you. i am sending cboone a note requesting an appt for the pt. Initial call taken by: Marin Roberts RN,  July 03, 2010 2:45 PM  Follow-up for Phone Call        Provider notified Follow-up by: Deatra Robinson MD,  July 04, 2010 9:55 AM

## 2010-07-13 ENCOUNTER — Ambulatory Visit
Admission: RE | Admit: 2010-07-13 | Discharge: 2010-07-13 | Disposition: A | Discharge status: Home / Self Care | Payer: Medicaid Other | Source: Ambulatory Visit | Attending: *Deleted | Admitting: *Deleted

## 2010-07-13 DIAGNOSIS — Z1231 Encounter for screening mammogram for malignant neoplasm of breast: Secondary | ICD-10-CM

## 2010-08-19 LAB — GLUCOSE, CAPILLARY: Glucose-Capillary: 289 mg/dL — ABNORMAL HIGH (ref 70–99)

## 2010-09-07 LAB — POCT URINALYSIS DIP (DEVICE)
Bilirubin Urine: NEGATIVE
Hgb urine dipstick: NEGATIVE
Nitrite: NEGATIVE
Specific Gravity, Urine: 1.02 (ref 1.005–1.030)
pH: 5.5 (ref 5.0–8.0)

## 2010-09-11 LAB — GLUCOSE, CAPILLARY: Glucose-Capillary: 310 mg/dL — ABNORMAL HIGH (ref 70–99)

## 2010-09-18 LAB — GLUCOSE, CAPILLARY: Glucose-Capillary: 163 mg/dL — ABNORMAL HIGH (ref 70–99)

## 2010-09-25 ENCOUNTER — Other Ambulatory Visit: Payer: Self-pay | Admitting: Internal Medicine

## 2010-10-02 ENCOUNTER — Other Ambulatory Visit: Payer: Self-pay | Admitting: Internal Medicine

## 2010-10-02 MED ORDER — METFORMIN HCL 1000 MG PO TABS: 1000.0000 mg | ORAL_TABLET | Freq: Two times a day (BID) | ORAL | Status: DC

## 2010-10-02 NOTE — Telephone Encounter (Signed)
Electronic refill request for metformin 1,000 mg BID in [facility] received on 4/24, did not flow into Epic.  Medication refilled; I have requested that an appointment be scheduled with patient's PCP.

## 2010-10-03 ENCOUNTER — Other Ambulatory Visit: Payer: Self-pay | Admitting: *Deleted

## 2010-10-03 ENCOUNTER — Other Ambulatory Visit: Payer: Self-pay | Admitting: Internal Medicine

## 2010-10-03 DIAGNOSIS — Z1231 Encounter for screening mammogram for malignant neoplasm of breast: Secondary | ICD-10-CM

## 2010-10-08 ENCOUNTER — Encounter: Payer: Self-pay | Admitting: Internal Medicine

## 2010-10-20 NOTE — Discharge Summary (Signed)
   NAME:  Sharon Fisher, PITTSLEY                         ACCOUNT NO.:  [id number]   MEDICAL RECORD NO.:  [id number]                   PATIENT TYPE:  INP   LOCATION:  9119                                 FACILITY:  [facility]   PHYSICIAN:  Juluis Mire, M.D.                DATE OF BIRTH:  1970-07-12   DATE OF ADMISSION:  04/20/2002  DATE OF DISCHARGE:  04/22/2002                                 DISCHARGE SUMMARY   ADMISSION DIAGNOSES:  Bilateral ovarian enlargement with associated pelvic  pain.   DISCHARGE DIAGNOSES:  1. Right benign cystic teratoma.  2. Left benign serous cystadenoma.  3. Pelvic adhesions.   OPERATIVE PROCEDURE:  1. Exploratory laparotomy and lysis of adhesions.  2. Removal of right cystic teratoma.  3. Left ovarian wedge resection.   For complete history and physical please see dictated note.   COURSE IN THE HOSPITAL:  The patient underwent the above-noted surgery.  Please see operative note for details.  Pathology revealed a benign mature  cystic teratoma of the right ovary.  A portion of the left ovary revealed  benign serous cystadenoma.  No borderline or malignant changes were  identified.  Numerous pelvic adhesions were taken down.  Postoperatively the  patient did well.  Her postoperative hemoglobin was 11.6.  She was  discharged home on postoperative day #2.  At that time she was afebrile with  stable vital signs.  Abdomen was soft and nontender, bowel sounds were  active.  She actually had a bowel movement and was passing flatus.  Her  midline incision was intact, staples were still in place.  She was also  voiding without difficulty.   COMPLICATIONS ENCOUNTERED DURING STAY IN HOSPIITAL:  The patient was  discharged in stable condition.   DISPOSITION:  1. Routine postoperative instructions were given.  2. She was to avoid heavy lifting or driving a car.  3. She was to watch for signs of infection, nausea, vomiting, increase in     abdominal pain, or  incisional infection.  4. Follow-up in the office will be in three to four days to remove staples.  5. Discharged home on Tylox as needed for pain.                                               Juluis Mire, M.D.    JSM/MEDQ  D:  04/22/2002  T:  04/22/2002  Job:  [id number]

## 2010-10-20 NOTE — Op Note (Signed)
NAME:  Sharon Fisher, Sharon Fisher                         ACCOUNT NO.:  [id number]   MEDICAL RECORD NO.:  [id number]                   PATIENT TYPE:  INP   LOCATION:  9119                                 FACILITY:  [facility]   PHYSICIAN:  Juluis Mire, M.D.                DATE OF BIRTH:  1970/08/25   DATE OF PROCEDURE:  04/20/2002  DATE OF DISCHARGE:                                 OPERATIVE REPORT   PREOPERATIVE DIAGNOSES:  1. Bilateral ovarian enlargement.  2. Pelvic adhesions.   POSTOPERATIVE DIAGNOSES:  1. A right-side ovarian teratoma.  2. Left serous cystadenoma of the left ovary.  3. Pelvic adhesions.   PROCEDURES:  1. Exploratory laparotomy with excision of right ovarian teratoma.  2. Left ovarian wedge resection with resection.  3. Lysis of adhesions.   SURGEON:  Juluis Mire, M.D.   ASSISTANT:  Duke Salvia. Marcelle Overlie, M.D.   ANESTHESIA:  General endotracheal.   ESTIMATED BLOOD LOSS:  200-300 cc.   PACKS/DRAINS:  None.   BLOOD REPLACED:  None.   COMPLICATIONS:  None.   INDICATIONS:  As dictated in the history and physical.   DESCRIPTION OF PROCEDURE:  The patient was taken to the OR and placed in the  supine position.  After a satisfactory level of general endotracheal  anesthesia obtained, the abdomen, perineum, and vagina were prepped out with  Betadine and draped as a sterile field.  A low midline incision was made  with a knife, carried through subcutaneous tissue.  The fascia was entered  sharply and the incision in the fascia was extended both superiorly and  inferiorly.  The rectus muscles were separated in the midline.  The  peritoneum was entered and the incision in the peritoneum extended both  superiorly and inferiorly.  She did have some omental adhesions to the  subumbilical area.  These were taken down without difficulty.  An Lenox Ahr retractor was put in place.  She did have omental adhesions to  the growth involving the right ovary.  This was  on a stalk.  These omental  adhesions were taken down using the Bovie.  This area was then elevated.  We  identified the ovarian vasculature coming to the ovary.  Again this growth  measured approximately 5 cm, and it was on a stalk away from the right  ovary.  The stalk was clamped and cut, and the specimen was passed off the  operative field.  The pedicle was secured with first a free tie of 0 Vicryl,  then a suture ligature of 0 Vicryl.  She did have adhesions to the posterior  aspect of the uterus.  These were also taken down.  These were somewhat  flimsy.  The cul-de-sac was otherwise clear.  There was no active  endometriosis or other pelvic process.  The right tube looked like  congenitally absent.  There was a small proximal section,  and it ended up in  a small blind pouch.  Looking at the left side, the left ovary was enlarged  with multiple cysts.  We could not discern one simple cyst to excise.  Therefore, we did a large wedge resection of the ovary, removing as much of  the cystic components as possible.  We then reconstructed the ovary using 3-  0 Vicryl for deep reconstruction and 3-0 PDS on the exterior for  reapproximation of the capsule.  We had good hemostasis and reconstruction  of the ovary, and it appeared that almost all of the cystic areas had been  removed.  We sent both away for frozen section.  The growth on the right  ovary was a teratoma that appeared to be benign.  The left ovary was a  serous cystadenoma.  There was no malignant component noted.  Once we  decided it was a serous cystadenoma, we questioned about removing the left  ovary completely.  However, we felt that we had resected the majority of  these and felt that the patient's desire was for ovarian conservation;  therefore, the remaining ovary was left in place.  The left tube did appear  to be completely normal.  It did not fill on previous hysterosalpingogram,  but there were no obstructed areas  noted.  At this point in time, Interceed  was placed over the left ovary.  We thoroughly irrigated the pelvis.  Hemostasis was absolutely excellent.  All packs were removed.  The appendix  was visualized and noted to be normal.  The O'Connor-O'Sullivan retractor  was put in place.  The peritoneum was closed with a running suture of 3-0  Vicryl.  The muscles were reapproximated.  The fascia was closed with a  running suture of 0 PDS.  The subcu was closed with a running suture of 3-0  plain catgut.  Skin was closed with staples and Steri-Strips.  Urine output  was clear at the time of closure and adequate.  Sponge, instrument, and  needle counts were reported as correct by the circulating nurse x2.  The  patient was extubated and transferred to the recovery room in good  condition.                                               Juluis Mire, M.D.    JSM/MEDQ  D:  04/21/2002  T:  04/21/2002  Job:  [id number]

## 2010-10-20 NOTE — H&P (Signed)
NAME:  Sharon Fisher, Sharon Fisher                         ACCOUNT NO.:  [id number]   MEDICAL RECORD NO.:  [id number]                   PATIENT TYPE:  AMB   LOCATION:  SDC                                  FACILITY:  [facility]   PHYSICIAN:  Juluis Mire, M.D.                DATE OF BIRTH:  [birth year]   DATE OF ADMISSION:  DATE OF DISCHARGE:                                HISTORY & PHYSICAL   HISTORY OF PRESENT ILLNESS:  The patient is a 40 year old gravida 1, para 1  black female who presents for hysteroscopy with D&C and subsequent  diagnostic laparoscopy with laser standby.   In relation to the present admission, the patient was referred to our office  from [facility] for evaluation of a pelvic mass.  She had been seen in the  clinic and found to have a right-sided mass.  She had been complaining of  increasing right lower quadrant pain.  Previous ultrasound done at [facility] that suggested a possibility of uterine fibroid.  Subsequent  ultrasound in our office had revealed a 6 cm right adnexal mass.  Findings  on ultrasound were consistent with bilateral endometriomas of both ovaries  measuring 5-6 cm.  She also had a questionable hydrosalpinx.  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  Her cycle is presently every 28 days.  She describes seven days of flow,  most of it being relatively heavy.  In view of this, we will proceed with  hysteroscopy.   ALLERGIES:  The patient has no known drug allergies.   MEDICATIONS:  None.   PAST MEDICAL HISTORY:  Usual childhood diseases.  The patient does have a  history of glucose intolerance which has been diet controlled.   PAST SURGICAL HISTORY:  Previous tonsillectomy as well as breast reduction.   OBSTETRICAL HISTORY:  One spontaneous vaginal delivery.   FAMILY HISTORY:  Maternal grandmother has a history of diabetes.  Her sister  was born with spina bifida and died after eight months.  Mother  has history  of breast cancer at age 52.  Maternal grandmother history of lung cancer as  well as hypertension.   SOCIAL HISTORY:  No tobacco or alcohol use.   REVIEW OF SYMPTOMS:  Noncontributory.   PHYSICAL EXAMINATION:  VITAL SIGNS:  The patient is afebrile with stable  vital signs.  HEENT:  The patient normocephalic.  Pupils are equal, round, and reactive to  light and accommodation.  Extraocular movements are intact.  Sclerae and  conjunctivae clear.  Oropharynx clear.  NECK:  Without thyromegaly.  BREASTS:  No discreet masses.  LUNGS:  Clear.  CARDIOVASCULAR:  Regular rhythm and rate without murmurs or gallops.  ABDOMEN:  Benign.  No mass, organomegaly, or tenderness.  PELVIC:  Normal external genitalia.  Vaginal mucosa is clear.  Cervix  unremarkable.  Uterus upper limits of normal size.  Does have bilateral  adnexal fullness.  EXTREMITIES:  Trace edema.  NEUROLOGIC:  Grossly within normal limits.   IMPRESSION:  1. Bilateral ovarian masses probably consistent with endometriosis.  2. Menorrhagia.   PLAN:  The patient will undergo above noted surgery.  The risks of surgery  have been discussed including the risk of infection, the risk of hemorrhage  that could necessitate transfusion with the risk of AIDS or hepatitis, the  risk of injury to adjacent organs including bladder, bowel, ureters that  could require further exploratory surgery, the risk of deep venous  thrombosis, and pulmonary embolus.  The patient expressed understanding of  indications and risks.                                               Juluis Mire, M.D.    JSM/MEDQ  D:  02/25/2002  T:  02/25/2002  Job:  [id number]

## 2010-10-20 NOTE — H&P (Signed)
NAME:  Sharon Fisher, Sharon Fisher                         ACCOUNT NO.:  [id number]   MEDICAL RECORD NO.:  [id number]                   PATIENT TYPE:   LOCATION:                                       FACILITY:   PHYSICIAN:  Juluis Mire, M.D.                DATE OF BIRTH:  1970/09/13   DATE OF ADMISSION:  04/20/2002  DATE OF DISCHARGE:                                HISTORY & PHYSICAL   REASON FOR ADMISSION:  The patient is a 40 year old gravida 1, para 1  married black female presents for exploratory laparoscopy, possible  bilateral ovarian cystectomy, possible total abdominal hysterectomy  bilateral salpingo-oophorectomy.   HISTORY OF PRESENT ILLNESS:  The patient was referred to our office from  [facility] for evaluation of a pelvic mass.  She had been seen in the  clinic and found to have a right-sided mass.  She had also been complaining  of increasing right lower quadrant pain.  Previous ultrasound done at [facility] suggests the possibility of a uterine fibroid.  Subsequent  ultrasound in our office revealed a 6 cm right adnexal mass as well as a 5-6  cm left adnexal mass.  Findings were consistent with possible bilateral  endometriomas.  She does have a longstanding history of secondary  infertility and felt this would fit her diagnosis.  Subsequently, she  underwent a hysteroscopy, a laparoscopy with a laser standby on September  25th.  She had cystic enlargement of the ovaries.  The left ovary appeared  to have multiple cysts.  Did not appear to be an endometrioma but possibly  some serous cystadenoma.  The right ovary was actually above the uterus  involved in omental adhesions and appeared to be on a stalk from the ovarian  vasculature.  It did not appear to be an endometrioma but possibly some type  of fibroma.  In view of this, we did not try to approach this  laparoscopically and she presents for exploratory surgery for management.  Of note, her CA 125 was negative.   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  In view of these findings, we discussed the possibility of  proceeding with total abdominal hysterectomy for management of pelvic  adhesions and pain.  She declines this, just wishing the ovarian issues to  be checked out.  She does understand that if these are malignant or  potentially of low malignant potential more aggressive surgical management  including hysterectomy and bilateral salpingo-oophorectomy may be needed.  This would obviously limit her fertility potential.   ALLERGIES:  The patient has no known drug allergies.   MEDICATIONS:  None.   PAST MEDICAL HISTORY:  Usual childhood diseases.  Does have a history of  glucose intolerance controlled with diet.   PAST SURGICAL HISTORY:  Includes that as noted above.  She also had a  previous tonsillectomy and breast reduction.   OBSTETRICAL HISTORY:  Shows one spontaneous vaginal delivery.   FAMILY HISTORY:  Maternal grandmother with a history of diabetes.  Her  sister was born with spina bifida.  Mother had a history of breast cancer at  age 87.  Her maternal grandmother has a history of lung cancer as well as  hypertension.   SOCIAL HISTORY:  No tobacco or alcohol use.   REVIEW OF SYSTEMS:  Noncontributory.   PHYSICAL EXAMINATION:  VITAL SIGNS:  Afebrile with stable vital signs.  HEENT:  The patient is normocephalic.  Pupils equal, round, reactive to  light and accommodation.  Extraocular movements intact.  Sclerae and  conjunctivae are clear.  Oropharynx clear.  NECK:  Without thyromegaly.  BREASTS:  No discrete masses.  LUNGS:  Clear.  CARDIOVASCULAR:  Regular rhythm and rate without murmurs or gallops.  ABDOMEN:  Benign.  No masses, organomegaly or tenderness.  PELVIC:  Normal external genitalia.  Vaginal mucosa is clear.  Cervix is  unremarkable.  Uterus and salpinx normal size, irregular  adnexa, fullness  bilaterally.  EXTREMITIES:  Trace edema.  NEUROLOGICAL:  Grossly within normal limits.   IMPRESSION:  1. Pelvic adhesions.  2. Bilateral ovarian masses.   PLAN:  The patient to undergo exploratory surgery, possible bilateral  ovarian cystectomies, possible TAH-BSO with staging procedure.  The risks of  surgery have been discussed including the risks of anesthesia.  The risk  infection.  The risk of hemorrhage that could necessitate transfusion with  the risk of AIDS or hepatitis.  The risk of injury to adjacent organs  including bladder, bowel, ureters that could require further exploratory  surgery.  The risk of deep vein thrombosis and pulmonary embolus.  The  patient appears to understand indications and risks.                                               Juluis Mire, M.D.    JSM/MEDQ  D:  04/20/2002  T:  04/20/2002  Job:  [id number]

## 2010-10-20 NOTE — Op Note (Signed)
NAME:  Sharon Fisher, Sharon Fisher                         ACCOUNT NO.:  [id number]   MEDICAL RECORD NO.:  [id number]                   PATIENT TYPE:  AMB   LOCATION:  SDC                                  FACILITY:  [facility]   PHYSICIAN:  Juluis Mire, M.D.                DATE OF BIRTH:  02-08-1971   DATE OF PROCEDURE:  02/26/2002  DATE OF DISCHARGE:                                 OPERATIVE REPORT   PREOPERATIVE DIAGNOSES:  1. Bilateral cystic enlargement.  2. Probable endometriosis.   POSTOPERATIVE DIAGNOSES:  1. Bilateral ovarian neoplasms.  2. Pelvic adhesions.   OPERATIVE PROCEDURE:  1. Hysteroscopy.  2. Endometrial biopsies.  3. Open laparoscopy.   SURGEON:  Juluis Mire, M.D.   ANESTHESIA:  General endotracheal.   ESTIMATED BLOOD LOSS:  Minimal.   PACKS AND DRAINS:  None.   INTRAOPERATIVE BLOOD PLACED:  None.   COMPLICATIONS:  None.   INDICATIONS:  Dictated in history and physical.   PROCEDURE AS FOLLOWS:  The patient taken to the OR, placed in supine  position.  After a satisfactory level of general endotracheal anesthesia was  obtained the patient was placed in the dorsal lithotomy position using the  Allen stirrups.  At this point in time the abdomen, perineum, and vagina  were prepped out with Betadine.  The patient was then draped out for  hysteroscopy.  The speculum was placed in the vaginal vault.  The cervix  grasped with single tooth tenaculum.  Uterus sounded to approximately 8.5  cm.  Cervix serially dilated to a size 33 Pratt dilator.  The hysteroscope  was introduced.  Intrauterine cavity distended using sorbitol.  Endometrium  was smooth.  No polyps or fibroids.  Two biopsies were obtained and sent for  pathologic review.  The hysteroscope was then removed.  A Hulka tenaculum  was put in place.  Single tooth tenaculum and speculum were removed.  Bladder was emptied by in-and-out catheterization.   Subumbilical incision made with a knife and extended  through the  subcutaneous tissue.  The fascia was identified and entered sharply and the  fascia was then extended laterally.  Two sutures of 0 Vicryl were placed at  the edge of the fascial opening.  Peritoneum was entered.  The Hasson  cannula was put in place and secured.  Laparoscope was introduced.  There  was no evidence of injury to adjacent organs.  A 5 mm suprapubic trocar was  put in place under direct visualization.  Visualization:  Uterus was upper  limits of normal size.  Pelvic adhesions were noted.  The left ovary was  cystic, enlarged.  It looked like multiple cysts and it had the appearance  of a possible serous or mucinous cystadenoma.  It did not look like an  endometrioma.  The right ovary had an enlarged area extending from it.  It  almost looked like it was on  a stalk and then we had about a 6 cm cystic  enlargement with multiple adhesions from the omentum to this area.  There  was a large amount of vasculature over the outside.  Again, this did not  appear to be an endometrioma.  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  The left tube appeared to be normal.  There were adhesions in the  cul-de-sac in the anterior aspect of the uterus.  No active endometriosis  noted.  The appendix was visualized, noted to be normal.  Upper abdomen  including the liver were clear.  We decided that these ovarian enlargements  may not be endometriosis, therefore laparoscopic management was not  undertaken.  Decided to proceed with exploratory surgery at a later date.  At this point in time the abdomen was deflated of its carbon dioxide, all  trocars removed.  Subumbilical fascia closed with figure-of-eight of 0  Vicryl, skin with interrupted subcuticulars of 4-0 Vicryl.  Suprapubic  incisions closed with Steri-Strips.  The Hulka tenaculum was then removed.  The patient taken out of the dorsal lithotomy position.  Once alert and  extubated, transferred to recovery room in good  condition.  Sponge,  instrument, needle count reported as correct by circulating nurse.                                               Juluis Mire, M.D.    JSM/MEDQ  D:  02/26/2002  T:  02/26/2002  Job:  [id number]

## 2010-10-20 NOTE — Procedures (Signed)
NAME:  Sharon Fisher, Sharon Fisher NO.:  [id number]   MEDICAL RECORD NO.:  [id number]          PATIENT TYPE:  OUT   LOCATION:  SLEEP CENTER                 FACILITY:  [facility]   PHYSICIAN:  Clinton D. Maple Hudson, M.D. DATE OF BIRTH:  01-06-1971   DATE OF STUDY:  04/13/2005                              NOCTURNAL POLYSOMNOGRAM   REFERRING PHYSICIAN:  Dr. Ellie Lunch.   DATE OF STUDY:  April 13, 2005.   INDICATION FOR STUDY:  Hypersomnia with sleep apnea.   EPWORTH SLEEPINESS SCORE:  5/24.   BMI:  45.   WEIGHT:  275 pounds.   SLEEP ARCHITECTURE:  Total sleep time 415 minutes with sleep efficiency 92%.  Stage I was 10%, stage II 57%, stages III and IV 7%, REM 26% of total sleep  time. Sleep latency 10.5 minutes, REM latency 114 minutes, awake after sleep  onset 32 minutes, arousal index 30.9. No bedtime medication taken.   RESPIRATORY DATA:  Apnea/hypopnea index (AHI, RDI) 3.2 obstructive events  per hour which is within normal limits (0/5 per hour). This included 1  obstructive apnea and 21 hypopneas. Events were not positional. REM AHI 6.2  per hour.   OXYGEN DATA:  Mild to moderate snoring with oxygen desaturation to a nadir  of 90%. Mean oxygen saturation through the study was 97% on room air.   CARDIAC DATA:  Normal sinus rhythm.   MOVEMENT/PARASOMNIA:  Occasional leg jerks with no effect on sleep.   IMPRESSION/RECOMMENDATIONS:  Occasional sleep disorder breathing events, AHI  3.2 per hour, most common in REM. This is within normal limits and does not  indicate specific therapy. There is mild snoring with normal oxygenation.      Clinton D. Maple Hudson, M.D.  Diplomate, Biomedical engineer of Sleep Medicine  Electronically Signed     CDY/MEDQ  D:  04/22/2005 09:02:46  T:  04/22/2005 22:09:54  Job:  [id number]

## 2010-10-24 ENCOUNTER — Encounter: Payer: Self-pay | Admitting: Internal Medicine

## 2010-10-24 ENCOUNTER — Ambulatory Visit (INDEPENDENT_AMBULATORY_CARE_PROVIDER_SITE_OTHER): Payer: Medicaid Other | Admitting: Internal Medicine

## 2010-10-24 VITALS — BP 132/86 | HR 92 | Temp 98.6°F | Ht 65.0 in | Wt 290.2 lb

## 2010-10-24 DIAGNOSIS — K5900 Constipation, unspecified: Secondary | ICD-10-CM

## 2010-10-24 DIAGNOSIS — J309 Allergic rhinitis, unspecified: Secondary | ICD-10-CM

## 2010-10-24 DIAGNOSIS — E119 Type 2 diabetes mellitus without complications: Secondary | ICD-10-CM

## 2010-10-24 DIAGNOSIS — L02229 Furuncle of trunk, unspecified: Secondary | ICD-10-CM

## 2010-10-24 DIAGNOSIS — J302 Other seasonal allergic rhinitis: Secondary | ICD-10-CM

## 2010-10-24 LAB — POCT GLYCOSYLATED HEMOGLOBIN (HGB A1C): Hemoglobin A1C: 8.7

## 2010-10-24 LAB — GLUCOSE, CAPILLARY: Glucose-Capillary: 257 mg/dL — ABNORMAL HIGH (ref 70–99)

## 2010-10-24 MED ORDER — DOXYCYCLINE HYCLATE 100 MG PO TABS: 100.0000 mg | ORAL_TABLET | Freq: Two times a day (BID) | ORAL | Status: AC

## 2010-10-24 MED ORDER — FEXOFENADINE HCL 60 MG PO TABS: 60.0000 mg | ORAL_TABLET | Freq: Every day | ORAL | Status: DC

## 2010-10-24 MED ORDER — GLIPIZIDE 10 MG PO TABS: 10.0000 mg | ORAL_TABLET | Freq: Two times a day (BID) | ORAL | Status: DC

## 2010-10-24 MED ORDER — DOCUSATE SODIUM 100 MG PO CAPS: 100.0000 mg | ORAL_CAPSULE | Freq: Two times a day (BID) | ORAL | Status: DC

## 2010-10-24 NOTE — Patient Instructions (Signed)
Please, take all your medications as prescribed. Please, follow up in 2 months for a diabetes follow up.

## 2010-12-05 ENCOUNTER — Emergency Department (HOSPITAL_COMMUNITY)
Admission: EM | Admit: 2010-12-05 | Discharge: 2010-12-05 | Disposition: A | Discharge status: Home / Self Care | Payer: Medicaid Other | Attending: Emergency Medicine | Admitting: Emergency Medicine

## 2010-12-05 DIAGNOSIS — I10 Essential (primary) hypertension: Secondary | ICD-10-CM | POA: Insufficient documentation

## 2010-12-05 DIAGNOSIS — L0211 Cutaneous abscess of neck: Secondary | ICD-10-CM | POA: Insufficient documentation

## 2010-12-05 DIAGNOSIS — J45909 Unspecified asthma, uncomplicated: Secondary | ICD-10-CM | POA: Insufficient documentation

## 2010-12-05 DIAGNOSIS — L03221 Cellulitis of neck: Secondary | ICD-10-CM | POA: Insufficient documentation

## 2010-12-05 DIAGNOSIS — Z79899 Other long term (current) drug therapy: Secondary | ICD-10-CM | POA: Insufficient documentation

## 2010-12-05 DIAGNOSIS — E119 Type 2 diabetes mellitus without complications: Secondary | ICD-10-CM | POA: Insufficient documentation

## 2010-12-07 ENCOUNTER — Encounter: Payer: Self-pay | Admitting: Internal Medicine

## 2010-12-07 ENCOUNTER — Ambulatory Visit (INDEPENDENT_AMBULATORY_CARE_PROVIDER_SITE_OTHER): Payer: Medicaid Other | Admitting: Internal Medicine

## 2010-12-07 VITALS — BP 124/81 | HR 96 | Temp 98.7°F | Ht 65.0 in | Wt 284.0 lb

## 2010-12-07 DIAGNOSIS — L0211 Cutaneous abscess of neck: Secondary | ICD-10-CM | POA: Insufficient documentation

## 2010-12-07 DIAGNOSIS — E119 Type 2 diabetes mellitus without complications: Secondary | ICD-10-CM

## 2010-12-07 DIAGNOSIS — L03221 Cellulitis of neck: Secondary | ICD-10-CM

## 2010-12-07 LAB — GLUCOSE, CAPILLARY: Glucose-Capillary: 121 mg/dL — ABNORMAL HIGH (ref 70–99)

## 2010-12-07 NOTE — Patient Instructions (Signed)
1. Please continue your doxycycline as prescribed till it is finished 2. Please continue the current pain regimen for your wound 3. Please monitor blood sugar closely and make sure that the blood sugar are well controlled so that it will facilitate the wound healing 4. Please change your dressing daily as instructed after you or your caregiver thoroughly wash your hand 5. Please do not get your incision wet or dirty. Make have to modify the way you take shower so that shower stream do not hit directly on your incision and dressing 6. Hot tub is ok as long as you do not get the incision wet. 7. Follow the following wound care instructions as well Wound Check Your wound appears healthy today. Your wound will heal gradually over time. Eventually a scar will form which fades with time. WHAT EFFECTS SCAR FORMATION:  People differ in the severity in which they scar.   Scar severity varies according to location, size and the traits you inherited from your parents (genetic predisposition).   Irritation to the wound from infection, rubbing or chemical exposure will increase the amount of scar formation.  HOME CARE INSTRUCTIONS  If you were given a dressing, you should change it at least once a day or as instructed by your caregiver. If the bandage sticks, soak it off with a solution of hydrogen peroxide.   If the bandage becomes wet, dirty, or develops a foul smell, change it as soon as possible.   Look for signs of infection (see below).   Only take over-the-counter or prescription medicines for pain, discomfort, or fever as directed by your caregiver.  SEEK IMMEDIATE MEDICAL CARE IF:  There is redness, swelling, or increasing pain in the wound.   Pus is coming from wound.   An unexplained oral temperature above 101.5 F (38.6 C) develops.   You notice a foul smell coming from the wound or dressing.  Document Released: 02/25/2004 Document Re-Released: 02/28/2008 [facility] Patient  Information 2011 [place], [place].

## 2010-12-07 NOTE — Progress Notes (Signed)
Subjective:    Patient ID: Sharon Fisher, female    DOB: 11-01-1970, 40 y.o.   MRN: [id number]  HPI  This is 40 year old pleasant lady who presents to the clinic for follow up after her ER visit on July 3rd, 2012. Pt stated that her posterior neck "abscess" started 5 days ago, gradual onset with neck pain radiating to the top of her head and bilateral posterior neck. Nothing relieves the pain, and her neck pain is worsened by the position change or palpation.  Pt felt that the size of her "abscess" was increasing and pain was worsening, subsequently she went to [facility] ER on 12/05/10. An I&D was done and moderate amount pus noted. Pt was prescribed Doxycycline and  hydrocodone-acetaminophen, and a referral to [facility] [phone])  was arranged by ER physician. Pt packing dressing was removed and an incision is noted at posterior neck.  Review of Systems No headache, fever, or sore throat. No shortness of breath or dyspnea on exertion. No chest pain, chest pressure or palpitation.  No nausea, vomiting, or abdominal pain. No melena, diarrhea or incontinence. No muscle weakness.                   Denies depression. No appetite or weight changes.  Past Medical History  Diagnosis Date  . Diabetes mellitus   . Allergic rhinitis   . Hypertension   . Hip pain, bilateral     ? lipomas  . Lymphedema     chronic, since age 29  . Mammogram abnormal     06, s/p biopsy of mass and neg work up for neoplasm  . Right ovarian cyst     s/p resection of r ovary 06  . Polyarthropathy     inflammatory  . Irregular menstruation   . GERD (gastroesophageal reflux disease)    Past Surgical History  Procedure Date  . Tonsillectomy   . Surgery of fibroids     but still has fibroids   History   Social History  . Marital Status: Married    Spouse Name: N/A    Number of Children: N/A  . Years of Education: N/A   Occupational History  . Not on file.   Social History Main Topics    . Smoking status: Former Games developer  . Smokeless tobacco: Not on file   Comment: Quit 1995.  Marland Kitchen Alcohol Use: No  . Drug Use: No  . Sexually Active: Not on file   Other Topics Concern  . Not on file   Social History Narrative   Works as a Engineer, structural.Lives at home with husband and daughter.   Family History  Problem Relation Age of Onset  . Hypertension Mother   . Cancer Mother     breast cancer  . Anemia Mother   . Sickle cell trait Sister   . Cancer Maternal Uncle     breast cancer  . Cancer Paternal Grandmother     lung cancer  . Hypertension Paternal Grandmother   . Diabetes Paternal Grandmother    Current Outpatient Prescriptions on File Prior to Visit  Medication Sig Dispense Refill  . albuterol (VENTOLIN HFA) 108 (90 BASE) MCG/ACT inhaler Inhale 2 puffs into the lungs every 6 (six) hours as needed.        Marland Kitchen amitriptyline (ELAVIL) 10 MG tablet Take 10 mg by mouth at bedtime. Take 1 tablet by mouth at bedtime x7 days then stop medication.       Marland Kitchen  aspirin 81 MG tablet Take 81 mg by mouth daily.        Marland Kitchen docusate sodium (COLACE) 100 MG capsule Take 1 capsule (100 mg total) by mouth 2 (two) times daily.  60 capsule  11  . enalapril (VASOTEC) 10 MG tablet TAKE 1 TABLET BY MOUTH ONCE A DAY  31 tablet  5  . fexofenadine (ALLEGRA) 60 MG tablet Take 1 tablet (60 mg total) by mouth daily.  30 tablet  11  . gabapentin (NEURONTIN) 300 MG capsule Take 300 mg by mouth. Take 1 tablet by mouth at bedtime.       Marland Kitchen glipiZIDE (GLUCOTROL) 10 MG tablet Take 1 tablet (10 mg total) by mouth 2 (two) times daily.  60 tablet  11  . metFORMIN (GLUCOPHAGE) 1000 MG tablet Take 1 tablet (1,000 mg total) by mouth 2 (two) times daily with a meal.  60 tablet  1  . naproxen (NAPROSYN) 250 MG tablet Take 250 mg by mouth 3 (three) times daily with meals. As needed for pain       . omeprazole (PRILOSEC) 20 MG capsule Take 20 mg by mouth. Take 2 pills by mouth once a day.       . traMADol (ULTRAM) 50 MG tablet  Take 50 mg by mouth every 6 (six) hours as needed.         Allergies  Allergen Reactions  . Shellfish Allergy      Objective:   Physical Exam  General: alert, well-developed, and cooperative to examination.  Mouth: pharynx pink and moist. Neck: supple, full ROM. Posterior neck 1 cm surgical incision noted. Site benign. minimum serosanguinous drainage noted. No signs of redness, swelling or pus. Lungs: normal respiratory effort, no accessory muscle use, normal breath sounds, no crackles, and no wheezes. Heart: normal rate, regular rhythm, no murmur, no gallop, and no rub.  Abdomen: soft, non-tender, normal bowel sounds, no distention, no guarding, no rebound tenderness, no hepatomegaly, and no splenomegaly.  Msk: no joint swelling, no joint warmth, and no redness over joints.  Pulses: 2+ DP/PT pulses bilaterally Extremities: No cyanosis, clubbing, edema Neurologic: alert & oriented X3, cranial nerves II-XII intact, strength normal in all extremities, sensation intact to light touch, and gait normal.  Skin: turgor normal and no rashes.  Psych: Oriented X3, memory intact for recent and remote, normally interactive, good eye contact, not anxious appearing, and not depressed appearing.      Assessment & Plan:

## 2010-12-07 NOTE — Assessment & Plan Note (Signed)
Presents to the clinic with post I&D of posterior neck abscess 2/2 sebaceous skin cyst.  - Packing dressing removed per protocol, and wound assessed without any signs of bleeding or infection.  - Discussed with Dr. Rogelia Boga. Will leave the wound without re-packing. steriel 4x4 dry gauze applied - wound care oral and written information given to pt -Pt is instructed to follow up with clinic in 1 week.

## 2010-12-11 NOTE — Progress Notes (Signed)
I saw, examined, and discussed Ms Coale with Dr Dierdre Searles and agree with her note contained here. The Er notes call this both a cyst and an abscess. They referred pt to surgery to remove "lining" indicating a cyst but notes of erythema and pus indicate an abscess. Dr Dierdre Searles removed packing - only few cm indicating a cavity that was not extremely large. We reviewed the UTD article and left wound unpacked as there was no purulent material.

## 2010-12-20 NOTE — Progress Notes (Signed)
  Subjective:    Patient ID: Sharon Fisher, female    DOB: [birth year], 40 y.o.   MRN: [id number]  HPI 1. C/o a recurrent "boil" to her lower abdomen. Patient reports felling worse " after squeezing it 1 week" prior to an OV. Denies any fever, chills, drainage or increase in pain. However, she states that her boil grew in size.   Review of Systems  Constitutional: Negative.   Respiratory: Negative for shortness of breath.   Cardiovascular: Negative for chest pain, palpitations and leg swelling.  Gastrointestinal: Negative.   Musculoskeletal: Negative for joint swelling and arthralgias.  Hematological: Negative.         Objective:   Physical Exam     General: Vital signs reviewed and noted. Well-developed, well-nourished, in no acute distress; alert, appropriate and cooperative throughout examination.  Head: Normocephalic, atraumatic.  Neck: No deformities, masses, or tenderness noted.  Lungs:  Normal respiratory effort. Clear to auscultation BL without crackles or wheezes.  Heart: RRR. S1 and S2 normal without gallop, murmur, or rubs.  Abdomen:  BS normoactive. Soft, Nondistended, non-tender.  No masses or organomegaly. There is a small 1 cm furuncle to a lower abdominal  Skin fold without any fluctuance, increased in warmth, or discharge.  Extremities: No pretibial edema.       Assessment & Plan:  1. Abdominal wall furuncle. Strongly advised "not to squeeze or touch" any lesions if she ever develops them. Personal hygiene reviewed. Abx given.

## 2011-01-03 ENCOUNTER — Encounter: Payer: Self-pay | Admitting: Internal Medicine

## 2011-01-03 ENCOUNTER — Ambulatory Visit (INDEPENDENT_AMBULATORY_CARE_PROVIDER_SITE_OTHER): Payer: Medicaid Other | Admitting: Internal Medicine

## 2011-01-03 VITALS — BP 124/83 | HR 80 | Temp 97.9°F | Ht 67.0 in | Wt 286.8 lb

## 2011-01-03 DIAGNOSIS — L0211 Cutaneous abscess of neck: Secondary | ICD-10-CM

## 2011-01-03 DIAGNOSIS — E119 Type 2 diabetes mellitus without complications: Secondary | ICD-10-CM

## 2011-01-03 LAB — GLUCOSE, CAPILLARY: Glucose-Capillary: 148 mg/dL — ABNORMAL HIGH (ref 70–99)

## 2011-01-03 MED ORDER — ACCU-CHEK FASTCLIX LANCETS MISC: 1.0000 | Freq: Two times a day (BID) | Status: DC

## 2011-01-03 MED ORDER — SULFAMETHOXAZOLE-TRIMETHOPRIM 800-160 MG PO TABS: 1.0000 | ORAL_TABLET | Freq: Two times a day (BID) | ORAL | Status: AC

## 2011-01-03 MED ORDER — HYDROCODONE-ACETAMINOPHEN 5-500 MG PO TABS: 1.0000 | ORAL_TABLET | Freq: Four times a day (QID) | ORAL | Status: DC | PRN

## 2011-01-03 NOTE — Progress Notes (Signed)
  Subjective:    Patient ID: Sharon Fisher, female    DOB: 1970-12-27, 40 y.o.   MRN: [id number]  HPI: 40 year old woman with past medical history significant for type 2 diabetes mellitus, posterior neck abscess s/p incision and drainage on 12/05/10, hypertension comes to the clinic for a followup  visit.  Patient was seen and evaluated in the clinic after her neck abscess drainage. At that time her abscess was healing well with no active drainage and patient was completing her course of doxycycline. She states that she was doing fine in the interim until a few days ago when she again started feeling some neck swelling and pain. She thinks her neck swelling is much less than before but it's hot.  She states she has some neck  pain associated with it , some headaches,  occasional dizziness but denies any fever, chills, nausea or  Vomiting.   Review of Systems  Constitutional: Negative for fever, chills, activity change, appetite change and fatigue.  HENT: Positive for neck pain. Negative for nosebleeds, congestion, facial swelling, rhinorrhea, sneezing and postnasal drip.   Respiratory: Negative for apnea, cough, choking, chest tightness, shortness of breath, wheezing and stridor.   Cardiovascular: Negative for chest pain, palpitations and leg swelling.  Gastrointestinal: Negative for abdominal distention.  Genitourinary: Negative for dysuria, frequency, hematuria, flank pain and pelvic pain.  Musculoskeletal: Negative for arthralgias.  Neurological: Negative for dizziness, light-headedness, numbness and headaches.  Hematological: Negative for adenopathy.       Objective:   Physical Exam  Constitutional: She is oriented to person, place, and time. She appears well-developed and well-nourished. No distress.  HENT:  Head: Normocephalic and atraumatic.  Right Ear: External ear normal.  Left Ear: External ear normal.  Eyes: Conjunctivae and EOM are normal. Pupils are equal, round, and  reactive to light.  Neck: Normal range of motion. Neck supple. No JVD present. No tracheal deviation present. No thyromegaly present.       Mild neck swelling, no erythema not warm to touch, her previous incision site appears to be healing well.  Cardiovascular: Normal rate, normal heart sounds and intact distal pulses.   Pulmonary/Chest: Effort normal and breath sounds normal. No stridor. No respiratory distress. She has no wheezes. She has no rales. She exhibits no tenderness.  Abdominal: Soft. Bowel sounds are normal. She exhibits no distension and no mass. There is no tenderness. There is no rebound and no guarding.  Musculoskeletal: Normal range of motion. She exhibits no edema and no tenderness.  Lymphadenopathy:    She has no cervical adenopathy.  Neurological: She is alert and oriented to person, place, and time. She has normal reflexes. She displays normal reflexes. No cranial nerve deficit. She exhibits normal muscle tone. Coordination normal.  Skin: Skin is warm. She is not diaphoretic.          Assessment & Plan:

## 2011-01-03 NOTE — Assessment & Plan Note (Signed)
Posterior neck abscess status post I&D on 12/05/10 who presents with recurrent neck swelling and pain. On my exam she had mild swelling and tender to palpation without any obvious redness and warmth. I do not think she has an abscess at this time but given her very recent history of neck abscess in the similar region, will treat her with 5 days of Bactrim. Patient was advised to call the clinic or come to the ER in case of worsening neck pain and swelling. Patient was also supposed to followup but she did not have a formal referral. Will get a surgery referral for evaluation of her neck swelling this time. Patient was advised to followup in 2 weeks with [place].

## 2011-01-03 NOTE — Assessment & Plan Note (Signed)
She was provided with Accu-Chek glucometer and lancets today. She also had a foot exam with the clinic visit.

## 2011-01-03 NOTE — Patient Instructions (Addendum)
Please take your medicines as prescribed. Please call the clinic or 911 if your swelling gets worse. We will call you with your referral appointment date and time.

## 2011-01-05 ENCOUNTER — Encounter (INDEPENDENT_AMBULATORY_CARE_PROVIDER_SITE_OTHER): Payer: Self-pay | Admitting: General Surgery

## 2011-01-09 ENCOUNTER — Other Ambulatory Visit: Payer: Self-pay | Admitting: *Deleted

## 2011-01-10 MED ORDER — METFORMIN HCL 1000 MG PO TABS: 1000.0000 mg | ORAL_TABLET | Freq: Two times a day (BID) | ORAL | Status: DC

## 2011-01-12 ENCOUNTER — Encounter (INDEPENDENT_AMBULATORY_CARE_PROVIDER_SITE_OTHER): Payer: Self-pay

## 2011-01-15 ENCOUNTER — Encounter (INDEPENDENT_AMBULATORY_CARE_PROVIDER_SITE_OTHER): Payer: Self-pay | Admitting: General Surgery

## 2011-01-16 ENCOUNTER — Ambulatory Visit (INDEPENDENT_AMBULATORY_CARE_PROVIDER_SITE_OTHER): Payer: Medicaid Other | Admitting: General Surgery

## 2011-01-16 ENCOUNTER — Encounter (INDEPENDENT_AMBULATORY_CARE_PROVIDER_SITE_OTHER): Payer: Self-pay | Admitting: General Surgery

## 2011-01-16 VITALS — BP 122/80 | HR 67 | Temp 97.8°F | Ht 65.0 in | Wt 283.2 lb

## 2011-01-16 DIAGNOSIS — E669 Obesity, unspecified: Secondary | ICD-10-CM

## 2011-01-16 DIAGNOSIS — L0211 Cutaneous abscess of neck: Secondary | ICD-10-CM

## 2011-01-16 DIAGNOSIS — L03221 Cellulitis of neck: Secondary | ICD-10-CM

## 2011-01-16 DIAGNOSIS — E119 Type 2 diabetes mellitus without complications: Secondary | ICD-10-CM

## 2011-01-16 DIAGNOSIS — E1169 Type 2 diabetes mellitus with other specified complication: Secondary | ICD-10-CM

## 2011-01-16 NOTE — Patient Instructions (Signed)
Return to see me in 3 months return sooner if any mass or tenderness developed in this area a center

## 2011-01-16 NOTE — Progress Notes (Signed)
Subjective:     Patient ID: Sharon Fisher, female   DOB: [birth year], 40 y.o.   MRN: [id number]  HPIThe patient was deferred from the family practice clinic where she's been followed for a abscess posterior neck possibly a sebaceous cyst and possibly a lipoma the patient states that she went to the emergency room as a scan about 1 June July 3 in the ER physician that I. ultrasound at bedside thought that this was as big sebaceous cyst and extramedullary transverse incision and drain the large amount of fluid the patient was followed up in the family practice clinic in the clinic some of the records site abscess of the site lipoma but at no time in the clinic today say anything about a epidermoid cyst her most recent visit in the clinic they recommended that she see a surgeon as the ER physician had told her that he could not get the core or the actual cyst wall. At present the patient has no evidence of any inflammation she's got a transverse incision about 2-1/2 cm in length and I cannot find or feel any areas of fluctuance or a cystic lesion on my examination   Review of SystemsThe patient is overweight at 284 plan and he is a type II diabetic but has not had previous problems with infections     Objective:   Physical ExamPhysical examination today Limited predominantly to the posterior neck there is a small incision well healed no drainage no tenderness and I cannot feel anything it feels like a sebaceous cyst remained he does have a large fatty tissue arrived the base of the neck but not in the area where she had this area drain and its fatty tissue think that's what they're talking about limited lipoma in the actual area where the I&D was performed with ultrasound and I cannot see any evidence that looks like a sac or cyst remaining and I cannot feel anything in that area         According to the patient and she shows an area about 4 inches or more in size to sinus more like it was a big abscess  that day and we then placed a pack in a.m. I placed her on antibiotics K. pull up the ER physician's record as far as what bacteria grew and the knot buried death we thought it was a cyst from a  I think that I find no evidence of any remaining sebaceous cyst at this time and I would not recommend anything except reexamine her in 3 months. If the patient does have a tender swelling in the area that occurs in the interim we need to see her back in the office on herI tried to explain to her that if this was an abscess no reexcision is needed if it is a sebaceous cyst I can find no evidence of assistant day but he probably will recur he seems comfortable with this decision and I'll plan on seeing her in 3 months    Plan:    See above note

## 2011-01-18 ENCOUNTER — Ambulatory Visit: Payer: Medicaid Other | Admitting: Internal Medicine

## 2011-03-05 LAB — GLUCOSE, CAPILLARY: Glucose-Capillary: 101 — ABNORMAL HIGH

## 2011-03-09 ENCOUNTER — Ambulatory Visit (INDEPENDENT_AMBULATORY_CARE_PROVIDER_SITE_OTHER): Payer: Medicaid Other | Admitting: General Surgery

## 2011-03-12 LAB — BASIC METABOLIC PANEL
BUN: 7
CO2: 25
Calcium: 9.2
Chloride: 105
Creatinine, Ser: 0.6
GFR calc Af Amer: >60
GFR calc non Af Amer: >60
Glucose, Bld: 84
Potassium: 3.9
Sodium: 140

## 2011-03-12 LAB — DIFFERENTIAL
Basophils Absolute: 0.2 — ABNORMAL HIGH
Basophils Relative: 2 — ABNORMAL HIGH
Eosinophils Absolute: 0.4
Eosinophils Relative: 5
Lymphocytes Relative: 41
Lymphs Abs: 3.2
Monocytes Absolute: 0.5
Monocytes Relative: 7
Neutro Abs: 3.4
Neutrophils Relative %: 45

## 2011-03-12 LAB — I-STAT 8, (EC8 V) (CONVERTED LAB)
Bicarbonate: 24.1 — ABNORMAL HIGH
Glucose, Bld: 79
TCO2: 25
pCO2, Ven: 39.7 — ABNORMAL LOW
pH, Ven: 7.392 — ABNORMAL HIGH

## 2011-03-12 LAB — URINALYSIS, ROUTINE W REFLEX MICROSCOPIC
Bilirubin Urine: NEGATIVE
Glucose, UA: NEGATIVE
Hgb urine dipstick: NEGATIVE
Ketones, ur: NEGATIVE
Nitrite: NEGATIVE
Protein, ur: NEGATIVE
Specific Gravity, Urine: 1.017
Urobilinogen, UA: 1
pH: 5.5

## 2011-03-12 LAB — CBC
HCT: 38.2
Hemoglobin: 12.9
MCHC: 33.8
MCV: 85.7
Platelets: 385
RBC: 4.46
RDW: 14.5
WBC: 7.7

## 2011-03-16 ENCOUNTER — Encounter (INDEPENDENT_AMBULATORY_CARE_PROVIDER_SITE_OTHER): Payer: Self-pay | Admitting: General Surgery

## 2011-03-29 ENCOUNTER — Encounter (INDEPENDENT_AMBULATORY_CARE_PROVIDER_SITE_OTHER): Payer: Medicaid Other | Admitting: Surgery

## 2011-04-05 ENCOUNTER — Encounter: Payer: Medicaid Other | Admitting: Internal Medicine

## 2011-07-20 ENCOUNTER — Other Ambulatory Visit: Payer: Self-pay | Admitting: Internal Medicine

## 2011-08-30 ENCOUNTER — Encounter (HOSPITAL_COMMUNITY): Payer: Self-pay | Admitting: Emergency Medicine

## 2011-08-30 ENCOUNTER — Emergency Department (HOSPITAL_COMMUNITY)
Admission: EM | Admit: 2011-08-30 | Discharge: 2011-08-30 | Disposition: A | Discharge status: Home / Self Care | Payer: Medicaid Other | Attending: Emergency Medicine | Admitting: Emergency Medicine

## 2011-08-30 DIAGNOSIS — M542 Cervicalgia: Secondary | ICD-10-CM | POA: Insufficient documentation

## 2011-08-30 DIAGNOSIS — R220 Localized swelling, mass and lump, head: Secondary | ICD-10-CM | POA: Insufficient documentation

## 2011-08-30 DIAGNOSIS — Z79899 Other long term (current) drug therapy: Secondary | ICD-10-CM | POA: Insufficient documentation

## 2011-08-30 DIAGNOSIS — Z7982 Long term (current) use of aspirin: Secondary | ICD-10-CM | POA: Insufficient documentation

## 2011-08-30 DIAGNOSIS — I10 Essential (primary) hypertension: Secondary | ICD-10-CM | POA: Insufficient documentation

## 2011-08-30 DIAGNOSIS — R51 Headache: Secondary | ICD-10-CM | POA: Insufficient documentation

## 2011-08-30 DIAGNOSIS — L0291 Cutaneous abscess, unspecified: Secondary | ICD-10-CM

## 2011-08-30 DIAGNOSIS — L0211 Cutaneous abscess of neck: Secondary | ICD-10-CM | POA: Insufficient documentation

## 2011-08-30 DIAGNOSIS — E119 Type 2 diabetes mellitus without complications: Secondary | ICD-10-CM | POA: Insufficient documentation

## 2011-08-30 DIAGNOSIS — K219 Gastro-esophageal reflux disease without esophagitis: Secondary | ICD-10-CM | POA: Insufficient documentation

## 2011-08-30 DIAGNOSIS — R221 Localized swelling, mass and lump, neck: Secondary | ICD-10-CM | POA: Insufficient documentation

## 2011-08-30 MED ORDER — SULFAMETHOXAZOLE-TRIMETHOPRIM 800-160 MG PO TABS: 2.0000 | ORAL_TABLET | Freq: Two times a day (BID) | ORAL | Status: AC

## 2011-08-30 MED ORDER — HYDROCODONE-ACETAMINOPHEN 5-325 MG PO TABS: 1.0000 | ORAL_TABLET | ORAL | Status: AC | PRN

## 2011-08-30 MED ORDER — HYDROCODONE-ACETAMINOPHEN 5-325 MG PO TABS
2.0000 | ORAL_TABLET | Freq: Once | ORAL | Status: AC
  Administered 2011-08-30: 2 via ORAL
  Filled 2011-08-30: qty 2

## 2011-08-30 NOTE — Discharge Instructions (Signed)
Please see your doctor, the urgent care, or the ER in 2 days for a wound recheck and packing removal.  Return to the ER immediately if you develop redness, swelling, increased pus draining from the wound, or fevers greater than 100.4.  You may return to the ER at any time for worsening condition or any new symptoms that concern you.  Abscess An abscess (boil or furuncle) is an infected area that contains a collection of pus.  SYMPTOMS Signs and symptoms of an abscess include pain, tenderness, redness, or hardness. You may feel a moveable soft area under your skin. An abscess can occur anywhere in the body.  TREATMENT  A surgical cut (incision) may be made over your abscess to drain the pus. Gauze may be packed into the space or a drain may be looped through the abscess cavity (pocket). This provides a drain that will allow the cavity to heal from the inside outwards. The abscess may be painful for a few days, but should feel much better if it was drained.  Your abscess, if seen early, may not have localized and may not have been drained. If not, another appointment may be required if it does not get better on its own or with medications. HOME CARE INSTRUCTIONS   Only take over-the-counter or prescription medicines for pain, discomfort, or fever as directed by your caregiver.   Take your antibiotics as directed if they were prescribed. Finish them even if you start to feel better.   Keep the skin and clothes clean around your abscess.   If the abscess was drained, you will need to use gauze dressing to collect any draining pus. Dressings will typically need to be changed 3 or more times a day.   The infection may spread by skin contact with others. Avoid skin contact as much as possible.   Practice good hygiene. This includes regular hand washing, cover any draining skin lesions, and do not share personal care items.   If you participate in sports, do not share athletic equipment, towels,  whirlpools, or personal care items. Shower after every practice or tournament.   If a draining area cannot be adequately covered:   Do not participate in sports.   Children should not participate in day care until the wound has healed or drainage stops.   If your caregiver has given you a follow-up appointment, it is very important to keep that appointment. Not keeping the appointment could result in a much worse infection, chronic or permanent injury, pain, and disability. If there is any problem keeping the appointment, you must call back to this facility for assistance.  SEEK MEDICAL CARE IF:   You develop increased pain, swelling, redness, drainage, or bleeding in the wound site.   You develop signs of generalized infection including muscle aches, chills, fever, or a general ill feeling.   You have an oral temperature above 102 F (38.9 C).  MAKE SURE YOU:   Understand these instructions.   Will watch your condition.   Will get help right away if you are not doing well or get worse.  Document Released: 02/28/2005 Document Revised: 05/10/2011 Document Reviewed: 12/23/2007 [facility] Patient Information 2012 [place], [place].

## 2011-08-30 NOTE — ED Notes (Signed)
Abscess at back of neck. No drainage noted

## 2011-08-30 NOTE — ED Notes (Signed)
PT. REPORTS ABSCESS AT BACK OF NECK FOR 3 DAYS , DENIES DRAINAGE , SLIGHT CHILLS.

## 2011-08-30 NOTE — ED Provider Notes (Signed)
History     CSN: [id number]  Arrival date & time 08/30/11  1939   First MD Initiated Contact with Patient 08/30/11 2228      Chief Complaint  Patient presents with  . Abscess    (Consider location/radiation/quality/duration/timing/severity/associated sxs/prior treatment) HPI Comments: Patient reports recurrent abscess of posterior neck. States she previously had abscess drained and packed, and it resolved.  States for the past three days she has had swelling, pain, small amount of purulent drainage.  States she has been using warm moist compresses on the abscess without improvement.  Denies fevers.    Patient is a 41 y.o. female presenting with abscess. The history is provided by the patient.  Abscess  Pertinent negatives include no fever and no sore throat.    Past Medical History  Diagnosis Date  . Diabetes mellitus   . Allergic rhinitis   . Hypertension   . Hip pain, bilateral     ? lipomas  . Lymphedema     chronic, since age 50  . Mammogram abnormal     06, s/p biopsy of mass and neg work up for neoplasm  . Right ovarian cyst     s/p resection of r ovary 06  . Polyarthropathy     inflammatory  . Irregular menstruation   . GERD (gastroesophageal reflux disease)     Past Surgical History  Procedure Date  . Tonsillectomy   . Surgery of fibroids     but still has fibroids    Family History  Problem Relation Age of Onset  . Hypertension Mother   . Cancer Mother     breast cancer  . Anemia Mother   . Sickle cell trait Sister   . Cancer Maternal Uncle     breast cancer  . Cancer Paternal Grandmother     lung cancer  . Hypertension Paternal Grandmother   . Diabetes Paternal Grandmother     History  Substance Use Topics  . Smoking status: Former Games developer  . Smokeless tobacco: Former Neurosurgeon   Comment: Quit 1995.  Marland Kitchen Alcohol Use: No    OB History    Grav Para Term Preterm Abortions TAB SAB Ect Mult Living                  Review of Systems    Constitutional: Negative for fever.  HENT: Positive for neck pain. Negative for sore throat, trouble swallowing and neck stiffness.   Neurological: Positive for headaches. Negative for weakness and numbness.  All other systems reviewed and are negative.    Allergies  Shellfish allergy  Home Medications   Current Outpatient Rx  Name Route Sig Dispense Refill  . ACCU-CHEK FASTCLIX LANCETS MISC Does not apply 1 each by Does not apply route 2 (two) times daily. Use to check blood sugar twice a day. Diagnosis code 250.00 100 each 11    Sample given  . ALBUTEROL SULFATE HFA 108 (90 BASE) MCG/ACT IN AERS Inhalation Inhale 2 puffs into the lungs every 6 (six) hours as needed. Shortness of breath     . ASPIRIN 81 MG PO TABS Oral Take 81 mg by mouth daily.      Marland Kitchen DOCUSATE SODIUM 100 MG PO CAPS Oral Take 100 mg by mouth 2 (two) times daily.    . ENALAPRIL MALEATE 10 MG PO TABS Oral Take 10 mg by mouth daily.    Marland Kitchen FEXOFENADINE HCL 60 MG PO TABS Oral Take 60 mg by mouth daily.    Marland Kitchen  GABAPENTIN 300 MG PO CAPS Oral Take 300 mg by mouth at bedtime.     Marland Kitchen GLIPIZIDE 10 MG PO TABS Oral Take 10 mg by mouth 2 (two) times daily before a meal.    . METFORMIN HCL 1000 MG PO TABS Oral Take 1,000 mg by mouth 2 (two) times daily with a meal.    . NAPROXEN 250 MG PO TABS Oral Take 250 mg by mouth 3 (three) times daily as needed. As needed for pain    . OMEPRAZOLE 20 MG PO CPDR Oral Take 40 mg by mouth daily.       BP 166/97  Pulse 89  Temp(Src) 98.9 F (37.2 C) (Oral)  SpO2 99%  LMP 05/18/2011  Physical Exam  Nursing note and vitals reviewed. Constitutional: She is oriented to person, place, and time. She appears well-developed and well-nourished. No distress.  HENT:  Head: Normocephalic and atraumatic.  Neck: Neck supple.  Cardiovascular: Normal rate and regular rhythm.   Pulmonary/Chest: Effort normal and breath sounds normal.  Musculoskeletal: Normal range of motion.  Neurological: She is alert  and oriented to person, place, and time. She exhibits normal muscle tone. Coordination normal.  Skin: She is not diaphoretic.          Within skin fold of posterior neck, patient has area of induration with pinhole opening and purulent drainage.  Tender to palpation.  No overlying erythema.    Psychiatric: She has a normal mood and affect. Her behavior is normal. Judgment and thought content normal.    ED Course  Procedures (including critical care time)  Labs Reviewed - No data to display No results found.  INCISION AND DRAINAGE Performed by: Rise Patience Consent: Verbal consent obtained. Risks and benefits: risks, benefits and alternatives were discussed Type: abscess  Body area: posterior neck  Anesthesia: local infiltration  Local anesthetic: lidocaine 2% no epinephrine  Anesthetic total: 3 ml  Complexity: complex Blunt dissection to break up loculations  Drainage: purulent  Drainage amount: moderate  Packing material: 1/4 in iodoform gauze  Patient tolerance: Patient tolerated the procedure well with no immediate complications.     1. Abscess       MDM  Patient with abscess of posterior neck with purulent drainage.  This occurred within deep crease in neck and has occurred in this space previously.  I&D performed with moderate amount of purulent drainage.  No overlying cellulitis.  Culture obtained and patient started on antibiotics due to the location of the abscess.  Pt to follow up in 2 days for recheck and packing removal.  Wound care and return precautions discussion.  Patient and family verbalize understanding and agree with plan.          Rise Patience, [place] 08/30/11 2340

## 2011-08-31 NOTE — ED Provider Notes (Signed)
Medical screening examination/treatment/procedure(s) were performed by non-physician practitioner and as supervising physician I was immediately available for consultation/collaboration.   Carleene Cooper III, MD 08/31/11 [phone]

## 2011-09-03 LAB — CULTURE, ROUTINE-ABSCESS

## 2011-09-06 ENCOUNTER — Encounter: Payer: Medicaid Other | Admitting: Internal Medicine

## 2011-09-19 ENCOUNTER — Ambulatory Visit (INDEPENDENT_AMBULATORY_CARE_PROVIDER_SITE_OTHER): Payer: Medicaid Other | Admitting: Internal Medicine

## 2011-09-19 ENCOUNTER — Encounter: Payer: Self-pay | Admitting: Internal Medicine

## 2011-09-19 VITALS — BP 144/93 | HR 94 | Temp 97.7°F | Ht 67.0 in | Wt 288.2 lb

## 2011-09-19 DIAGNOSIS — E119 Type 2 diabetes mellitus without complications: Secondary | ICD-10-CM

## 2011-09-19 DIAGNOSIS — L03221 Cellulitis of neck: Secondary | ICD-10-CM

## 2011-09-19 DIAGNOSIS — Z79899 Other long term (current) drug therapy: Secondary | ICD-10-CM

## 2011-09-19 DIAGNOSIS — L0211 Cutaneous abscess of neck: Secondary | ICD-10-CM

## 2011-09-19 DIAGNOSIS — K219 Gastro-esophageal reflux disease without esophagitis: Secondary | ICD-10-CM

## 2011-09-19 DIAGNOSIS — E785 Hyperlipidemia, unspecified: Secondary | ICD-10-CM

## 2011-09-19 DIAGNOSIS — I10 Essential (primary) hypertension: Secondary | ICD-10-CM

## 2011-09-19 LAB — GLUCOSE, CAPILLARY: Glucose-Capillary: 234 mg/dL — ABNORMAL HIGH (ref 70–99)

## 2011-09-19 MED ORDER — GLIPIZIDE 10 MG PO TABS: 10.0000 mg | ORAL_TABLET | Freq: Two times a day (BID) | ORAL | Status: DC

## 2011-09-19 MED ORDER — ENALAPRIL MALEATE 10 MG PO TABS: 10.0000 mg | ORAL_TABLET | Freq: Every day | ORAL | Status: DC

## 2011-09-19 MED ORDER — OMEPRAZOLE 20 MG PO CPDR: 40.0000 mg | DELAYED_RELEASE_CAPSULE | Freq: Every day | ORAL | Status: DC

## 2011-09-19 MED ORDER — METFORMIN HCL 1000 MG PO TABS: 1000.0000 mg | ORAL_TABLET | Freq: Two times a day (BID) | ORAL | Status: DC

## 2011-09-19 MED ORDER — GABAPENTIN 300 MG PO CAPS: 300.0000 mg | ORAL_CAPSULE | Freq: Every day | ORAL | Status: DC

## 2011-09-19 NOTE — Assessment & Plan Note (Signed)
Posterior neck abscess s/p I&D x 2, most recently on 08/30/2011. Patient reports it flaring up with drainage. Not active at the moment, there is some hardness in that area, but no fluctuance. No redness or warmth. No abx warranted at this time, but will refer to general surgery, as patient may have sebaceous cyst with material deeper than simple I&D.

## 2011-09-19 NOTE — Progress Notes (Signed)
Subjective:     Patient ID: Sharon Fisher, female   DOB: 1970-09-21, 41 y.o.   MRN: [id number]  HPI Sharon Fisher is a 41 year old woman with history of DM II, HTN, Asthma, GERD, and diabetic neuropathy who presents to the clinic for the following:  1.) Cyst on neck that was drained about 2 weeks ago. Pt went to ER where it was I&Ded. Patient was also prescribed antbiotics which she has completed. Since then patient complains of pain in that area and reports that it flares up. It gets large, and warm. It resolves on its own. It is always causing pain and discomfort and patient reports she is unable to sleep on her neck.  2.) DM - does not check CBGs at home, and reports she has not taken her medication due to affordability, but her medicaid has been reinstated, and thus she would like refills. Denies abdominal pain, increased thirst or urination. Also denies dizziness and lightheadedness.   No other complaints or concerns today.  Review of Systems  All other systems reviewed and are negative.       Objective:   Physical Exam  Constitutional: She is oriented to person, place, and time. She appears well-developed.  HENT:  Head: Normocephalic and atraumatic.       Posterior neck mass incision where I&D done. No redness, erythema, mass palpated currently  Neck: Normal range of motion. Neck supple.  Cardiovascular: Normal rate and regular rhythm.   Pulmonary/Chest: Effort normal and breath sounds normal.  Abdominal: Soft. Bowel sounds are normal.  Musculoskeletal: Normal range of motion.  Neurological: She is alert and oriented to person, place, and time.  Psychiatric: She has a normal mood and affect.

## 2011-09-19 NOTE — Assessment & Plan Note (Signed)
Lab Results  Component Value Date   NA 140 11/02/2009   K 4.1 11/02/2009   CL 107 11/02/2009   CO2 22 11/02/2009   BUN 5* 11/02/2009   CREATININE 0.53 11/02/2009    BP Readings from Last 3 Encounters:  09/19/11 144/93  08/30/11 166/97  01/16/11 122/80    Assessment: Hypertension control:  mildly elevated  Progress toward goals:  unchanged Barriers to meeting goals:  financial need and nonadherence to medications  Plan: Hypertension treatment:  continue current medications patient has been out of enalapril for quite some time. Will restart this medication and recheck BP at next visit

## 2011-09-19 NOTE — Assessment & Plan Note (Signed)
Lab Results  Component Value Date   CHOL 138 11/02/2009   HDL 32* 11/02/2009   LDLCALC 83 11/02/2009   TRIG 113 11/02/2009   CHOLHDL 4.3 Ratio 11/02/2009   Well controlled with diet alone. Will check lipid profile today.

## 2011-09-19 NOTE — Patient Instructions (Signed)
Please follow up with surgeon as scheduled. Please take all medications as directed. Please follow up in 3 months.

## 2011-09-19 NOTE — Assessment & Plan Note (Signed)
Lab Results  Component Value Date   HGBA1C 8.2 09/19/2011   HGBA1C 7.0 01/17/2010   CREATININE 0.53 11/02/2009   MICROALBUR 2.33* 03/14/2010   MICRALBCREAT 18.0 03/14/2010   CHOL 138 11/02/2009   HDL 32* 11/02/2009   TRIG 113 11/02/2009    Last eye exam and foot exam:    Component Value Date/Time   HMDIABFOOTEX Done 01/17/2010    Assessment: Diabetes control: not controlled Progress toward goals: deteriorated Barriers to meeting goals: financial need and nonadherence to medications  Plan: Diabetes treatment: continue current medications Refer to: none Instruction/counseling given: reminded to bring blood glucose meter & log to each visit, reminded to bring medications to each visit, discussed the need for weight loss and discussed diet

## 2011-09-20 ENCOUNTER — Encounter (INDEPENDENT_AMBULATORY_CARE_PROVIDER_SITE_OTHER): Payer: Self-pay | Admitting: Surgery

## 2011-09-20 LAB — LIPID PANEL
Cholesterol: 124 mg/dL (ref 0–200)
VLDL: 26 mg/dL (ref 0–40)

## 2011-09-20 LAB — BASIC METABOLIC PANEL
Calcium: 8.8 mg/dL (ref 8.4–10.5)
Sodium: 138 mEq/L (ref 135–145)

## 2011-09-20 LAB — MICROALBUMIN / CREATININE URINE RATIO
Creatinine, Urine: 224.2 mg/dL
Microalb Creat Ratio: 23.2 mg/g (ref 0.0–30.0)
Microalb, Ur: 5.21 mg/dL — ABNORMAL HIGH (ref 0.00–1.89)

## 2011-09-24 NOTE — Progress Notes (Signed)
Addended by: Neomia Dear on: 09/24/2011 05:04 PM   Modules accepted: Orders

## 2012-04-21 ENCOUNTER — Ambulatory Visit: Payer: Medicaid Other | Admitting: Internal Medicine

## 2012-04-24 ENCOUNTER — Ambulatory Visit: Payer: Medicaid Other | Admitting: Internal Medicine

## 2012-07-04 ENCOUNTER — Other Ambulatory Visit: Payer: Self-pay | Admitting: *Deleted

## 2012-07-04 DIAGNOSIS — E119 Type 2 diabetes mellitus without complications: Secondary | ICD-10-CM

## 2012-07-04 MED ORDER — GLIPIZIDE 10 MG PO TABS: 10.0000 mg | ORAL_TABLET | Freq: Two times a day (BID) | ORAL | Status: DC

## 2012-07-04 MED ORDER — METFORMIN HCL 1000 MG PO TABS: 1000.0000 mg | ORAL_TABLET | Freq: Two times a day (BID) | ORAL | Status: DC

## 2012-07-04 MED ORDER — ENALAPRIL MALEATE 10 MG PO TABS: 10.0000 mg | ORAL_TABLET | Freq: Every day | ORAL | Status: DC

## 2012-07-04 NOTE — Telephone Encounter (Signed)
Last seen 4/13. Cancelled / no showed 5 of last 6 appts. Must make appt and been seen to get more refills.

## 2012-07-15 ENCOUNTER — Ambulatory Visit (INDEPENDENT_AMBULATORY_CARE_PROVIDER_SITE_OTHER): Payer: Medicaid Other | Admitting: Internal Medicine

## 2012-07-15 ENCOUNTER — Encounter: Payer: Self-pay | Admitting: Internal Medicine

## 2012-07-15 VITALS — BP 132/80 | HR 104 | Temp 98.9°F | Ht 65.0 in | Wt 274.1 lb

## 2012-07-15 DIAGNOSIS — L723 Sebaceous cyst: Secondary | ICD-10-CM

## 2012-07-15 DIAGNOSIS — G589 Mononeuropathy, unspecified: Secondary | ICD-10-CM

## 2012-07-15 DIAGNOSIS — K219 Gastro-esophageal reflux disease without esophagitis: Secondary | ICD-10-CM

## 2012-07-15 DIAGNOSIS — Z23 Encounter for immunization: Secondary | ICD-10-CM

## 2012-07-15 DIAGNOSIS — J189 Pneumonia, unspecified organism: Secondary | ICD-10-CM

## 2012-07-15 DIAGNOSIS — I10 Essential (primary) hypertension: Secondary | ICD-10-CM

## 2012-07-15 DIAGNOSIS — Z79899 Other long term (current) drug therapy: Secondary | ICD-10-CM

## 2012-07-15 DIAGNOSIS — E119 Type 2 diabetes mellitus without complications: Secondary | ICD-10-CM

## 2012-07-15 DIAGNOSIS — J45909 Unspecified asthma, uncomplicated: Secondary | ICD-10-CM

## 2012-07-15 DIAGNOSIS — R928 Other abnormal and inconclusive findings on diagnostic imaging of breast: Secondary | ICD-10-CM

## 2012-07-15 HISTORY — DX: Sebaceous cyst: L72.3

## 2012-07-15 HISTORY — DX: Encounter for immunization: Z23

## 2012-07-15 LAB — CBC WITH DIFFERENTIAL/PLATELET
Eosinophils Absolute: 0.1 10*3/uL (ref 0.0–0.7)
Hemoglobin: 11.7 g/dL — ABNORMAL LOW (ref 12.0–15.0)
Lymphocytes Relative: 46 % (ref 12–46)
Lymphs Abs: 3 10*3/uL (ref 0.7–4.0)
MCH: 28.6 pg (ref 26.0–34.0)
Monocytes Relative: 7 % (ref 3–12)
Neutro Abs: 3 10*3/uL (ref 1.7–7.7)
Neutrophils Relative %: 44 % (ref 43–77)
RBC: 4.09 MIL/uL (ref 3.87–5.11)

## 2012-07-15 LAB — GLUCOSE, CAPILLARY: Glucose-Capillary: 227 mg/dL — ABNORMAL HIGH (ref 70–99)

## 2012-07-15 MED ORDER — METFORMIN HCL 1000 MG PO TABS: 1000.0000 mg | ORAL_TABLET | Freq: Two times a day (BID) | ORAL | Status: DC

## 2012-07-15 MED ORDER — GABAPENTIN 300 MG PO CAPS: 300.0000 mg | ORAL_CAPSULE | Freq: Two times a day (BID) | ORAL | Status: DC

## 2012-07-15 MED ORDER — ENALAPRIL MALEATE 10 MG PO TABS: 10.0000 mg | ORAL_TABLET | Freq: Every day | ORAL | Status: DC

## 2012-07-15 MED ORDER — GLIPIZIDE 10 MG PO TABS: 10.0000 mg | ORAL_TABLET | Freq: Two times a day (BID) | ORAL | Status: DC

## 2012-07-15 NOTE — Assessment & Plan Note (Signed)
Neck mass likely sebaceous cyst. I will refer patient to surgery for further evaluation and management.

## 2012-07-15 NOTE — Assessment & Plan Note (Signed)
Flu vaccination given today  

## 2012-07-15 NOTE — Assessment & Plan Note (Signed)
Hgb A1c today is 8.7. Patient had ran out of her medication since month and never filled it. I will not change medical management today. Refilled Metformin and Glipizide. Obtained urine Micro-albumin. Did foot exam. Obtained Lipid panel.  Needs referral to Opthalmology during next office visit.

## 2012-07-15 NOTE — Assessment & Plan Note (Signed)
Blood pressure controlled. I will refill Enalapril today. Will obtain bmet today

## 2012-07-15 NOTE — Assessment & Plan Note (Signed)
Tdap given today.

## 2012-07-15 NOTE — Assessment & Plan Note (Signed)
Dicussed about foods increasing risk for reflux. Hand out given. She will see how expensive ranitidine is for her. For now she does not want omeprazole due to financial restrain

## 2012-07-15 NOTE — Progress Notes (Signed)
Subjective:   Patient ID: Sharon Fisher female   DOB: 23-Mar-1971 42 y.o.   MRN: [id number]  HPI: [name] is a 42 y.o. female with PMH signficant as outlined below who presented to the clinic for a follow up. Patient was last in the clinic on 09/2011 Patient's main concern is a bump in the back of her neck. It was drained in the past at least 2x. Every week she has flares up. It  That time it is painfull, swollen, and draines ( clear ) . She notices that the lesion is now becoming bigger and causes pain at the back of her neck and down her arm. Has noticed  chills with  It.   GERD: patient noted that she has reflux . Was given in the past omeprazole but it was to expensive. She currently trying some purple pills. She does not know what kind of medication.  It especially occurs with certain food   Past Medical History  Diagnosis Date  . Diabetes mellitus   . Allergic rhinitis   . Hypertension   . Hip pain, bilateral     ? lipomas  . Lymphedema     chronic, since age 67  . Mammogram abnormal     06, s/p biopsy of mass and neg work up for neoplasm  . Right ovarian cyst     s/p resection of r ovary 06  . Polyarthropathy     inflammatory  . Irregular menstruation   . GERD (gastroesophageal reflux disease)    Current Outpatient Prescriptions  Medication Sig Dispense Refill  . ACCU-CHEK FASTCLIX LANCETS MISC 1 each by Does not apply route 2 (two) times daily. Use to check blood sugar twice a day. Diagnosis code 250.00  100 each  11  . albuterol (VENTOLIN HFA) 108 (90 BASE) MCG/ACT inhaler Inhale 2 puffs into the lungs every 6 (six) hours as needed. Shortness of breath       . aspirin 81 MG tablet Take 81 mg by mouth daily.        Marland Kitchen docusate sodium (COLACE) 100 MG capsule Take 100 mg by mouth 2 (two) times daily.      . enalapril (VASOTEC) 10 MG tablet Take 1 tablet (10 mg total) by mouth daily.  30 tablet  0  . fexofenadine (ALLEGRA) 60 MG tablet Take 60 mg by mouth daily.       Marland Kitchen gabapentin (NEURONTIN) 300 MG capsule Take 1 capsule (300 mg total) by mouth at bedtime.  60 capsule  3  . glipiZIDE (GLUCOTROL) 10 MG tablet Take 1 tablet (10 mg total) by mouth 2 (two) times daily before a meal.  60 tablet  0  . metFORMIN (GLUCOPHAGE) 1000 MG tablet Take 1 tablet (1,000 mg total) by mouth 2 (two) times daily with a meal.  60 tablet  0  . naproxen (NAPROSYN) 250 MG tablet Take 250 mg by mouth 3 (three) times daily as needed. As needed for pain      . omeprazole (PRILOSEC) 20 MG capsule Take 2 capsules (40 mg total) by mouth daily.  60 capsule  3   No current facility-administered medications for this visit.   Family History  Problem Relation Age of Onset  . Hypertension Mother   . Cancer Mother     breast cancer  . Anemia Mother   . Sickle cell trait Sister   . Cancer Maternal Uncle     breast cancer  . Cancer Paternal Grandmother  lung cancer  . Hypertension Paternal Grandmother   . Diabetes Paternal Grandmother    History   Social History  . Marital Status: Married    Spouse Name: N/A    Number of Children: N/A  . Years of Education: N/A   Social History Main Topics  . Smoking status: Former Games developer  . Smokeless tobacco: Former Neurosurgeon     Comment: Quit 1995.  Marland Kitchen Alcohol Use: No  . Drug Use: No  . Sexually Active: None   Other Topics Concern  . None   Social History Narrative   Works as a Engineer, structural.   Lives at home with husband and daughter.   Review of Systems: Constitutional: Denies fever, chills, diaphoresis, appetite change and fatigue.  Respiratory: Denies SOB, DOE, cough, chest tightness,  and wheezing.   Cardiovascular: Denies chest pain, palpitations Gastrointestinal: Denies nausea, vomiting, abdominal pain, diarrhea, constipation, blood in stool and abdominal distention.  Genitourinary: Denies dysuria, urgency, frequency, hematuria, flank pain and difficulty urinating.   Neurological: Denies dizziness,syncope, weakness,  light-headedness, numbness   Objective:  Physical Exam: Filed Vitals:   07/15/12 1516  BP: 132/80  Pulse: 104  Temp: 98.9 F (37.2 C)  TempSrc: Oral  Height: 5\' 5"  (1.651 m)  Weight: 274 lb 1.6 oz (124.331 kg)  SpO2: 98%   Constitutional: Vital signs reviewed.  Patient is a well-developed and well-nourished female  in no acute distress and cooperative with exam. Alert and oriented x3.  Head: Normocephalic and atraumatic Ear: TM normal bilaterally Mouth: no erythema or exudates, MMM Eyes: PERRL, EOMI, conjunctivae normal, No scleral icterus.  Neck: Supple, Trachea midline normal ROM, No JVD, mass, thyromegaly, or carotid bruit present.  Cardiovascular: RRR, S1 normal, S2 normal, no MRG, pulses symmetric and intact bilaterally Pulmonary/Chest: CTAB, no wheezes, rales, or rhonchi Abdominal: Soft. Non-tender, non-distended, bowel sounds are normal, no masses, organomegaly, or guarding present.  GU: no CVA tenderness Musculoskeletal: No joint deformities, erythema, or stiffness, ROM full and no nontender Hematology: no cervical, inginal, or axillary adenopathy.  Neurological: A&O x3, Strength is normal and symmetric bilaterally, cranial nerve II-XII are grossly intact, no focal motor deficit, sensory intact to light touch bilaterally.  Skin: Warm, dry and intact. No rash, cyanosis, or clubbing.  Psychiatric: Normal mood and affect. speech and behavior is normal. Judgment and thought content normal. Cognition and memory are normal.   Assessment & Plan:

## 2012-07-15 NOTE — Patient Instructions (Signed)
You can try Ranitidine for Reflux

## 2012-07-15 NOTE — Assessment & Plan Note (Signed)
Needs referral for PFT. Did not refill albuterol inhaler today.

## 2012-07-15 NOTE — Assessment & Plan Note (Signed)
Will refer for mammogram. 

## 2012-07-16 LAB — MICROALBUMIN / CREATININE URINE RATIO
Creatinine, Urine: 219.5 mg/dL
Microalb Creat Ratio: 11.3 mg/g (ref 0.0–30.0)
Microalb, Ur: 2.49 mg/dL — ABNORMAL HIGH (ref 0.00–1.89)

## 2012-07-16 LAB — COMPREHENSIVE METABOLIC PANEL
ALT: 13 U/L (ref 0–35)
CO2: 25 mEq/L (ref 19–32)
Chloride: 101 mEq/L (ref 96–112)
Sodium: 134 mEq/L — ABNORMAL LOW (ref 135–145)
Total Bilirubin: 0.5 mg/dL (ref 0.3–1.2)
Total Protein: 7.5 g/dL (ref 6.0–8.3)

## 2012-07-16 LAB — LIPID PANEL
Cholesterol: 172 mg/dL (ref 0–200)
VLDL: 39 mg/dL (ref 0–40)

## 2012-07-23 ENCOUNTER — Ambulatory Visit (INDEPENDENT_AMBULATORY_CARE_PROVIDER_SITE_OTHER): Payer: Self-pay | Admitting: General Surgery

## 2012-07-29 ENCOUNTER — Other Ambulatory Visit: Payer: Self-pay | Admitting: Internal Medicine

## 2012-07-29 ENCOUNTER — Ambulatory Visit (HOSPITAL_COMMUNITY): Payer: Medicaid Other

## 2012-07-29 DIAGNOSIS — Z1231 Encounter for screening mammogram for malignant neoplasm of breast: Secondary | ICD-10-CM

## 2012-07-29 DIAGNOSIS — Z803 Family history of malignant neoplasm of breast: Secondary | ICD-10-CM

## 2012-07-30 ENCOUNTER — Ambulatory Visit (INDEPENDENT_AMBULATORY_CARE_PROVIDER_SITE_OTHER): Payer: Self-pay | Admitting: General Surgery

## 2012-07-30 NOTE — Addendum Note (Signed)
Addended by: Neomia Dear on: 07/30/2012 05:46 PM   Modules accepted: Orders

## 2012-08-05 ENCOUNTER — Ambulatory Visit
Admission: RE | Admit: 2012-08-05 | Discharge: 2012-08-05 | Disposition: A | Discharge status: Home / Self Care | Payer: Medicaid Other | Source: Ambulatory Visit | Attending: Internal Medicine | Admitting: Internal Medicine

## 2012-08-05 DIAGNOSIS — Z1231 Encounter for screening mammogram for malignant neoplasm of breast: Secondary | ICD-10-CM

## 2012-08-14 ENCOUNTER — Ambulatory Visit (INDEPENDENT_AMBULATORY_CARE_PROVIDER_SITE_OTHER): Payer: Self-pay | Admitting: General Surgery

## 2012-08-18 ENCOUNTER — Ambulatory Visit (INDEPENDENT_AMBULATORY_CARE_PROVIDER_SITE_OTHER): Payer: Self-pay | Admitting: Surgery

## 2012-08-20 ENCOUNTER — Ambulatory Visit: Payer: Medicaid Other | Admitting: Internal Medicine

## 2012-09-25 ENCOUNTER — Ambulatory Visit (INDEPENDENT_AMBULATORY_CARE_PROVIDER_SITE_OTHER): Payer: Self-pay | Admitting: Surgery

## 2012-11-07 ENCOUNTER — Ambulatory Visit (INDEPENDENT_AMBULATORY_CARE_PROVIDER_SITE_OTHER): Payer: Self-pay | Admitting: Surgery

## 2012-12-11 ENCOUNTER — Other Ambulatory Visit: Payer: Self-pay

## 2012-12-17 ENCOUNTER — Telehealth: Payer: Self-pay | Admitting: Dietician

## 2012-12-18 NOTE — Telephone Encounter (Signed)
Patient called per referral by dr. Loistine Chance. Appointment scheduled

## 2012-12-26 ENCOUNTER — Other Ambulatory Visit: Payer: Self-pay | Admitting: *Deleted

## 2012-12-26 DIAGNOSIS — E119 Type 2 diabetes mellitus without complications: Secondary | ICD-10-CM

## 2012-12-26 DIAGNOSIS — I10 Essential (primary) hypertension: Secondary | ICD-10-CM

## 2012-12-26 MED ORDER — ENALAPRIL MALEATE 10 MG PO TABS: 10.0000 mg | ORAL_TABLET | Freq: Every day | ORAL | Status: DC

## 2012-12-26 MED ORDER — GLIPIZIDE 10 MG PO TABS: 10.0000 mg | ORAL_TABLET | Freq: Two times a day (BID) | ORAL | Status: DC

## 2012-12-26 MED ORDER — METFORMIN HCL 1000 MG PO TABS: 1000.0000 mg | ORAL_TABLET | Freq: Two times a day (BID) | ORAL | Status: DC

## 2012-12-26 NOTE — Telephone Encounter (Signed)
Please schedule a follow-up appointment with patient's PCP. 

## 2012-12-29 ENCOUNTER — Ambulatory Visit (INDEPENDENT_AMBULATORY_CARE_PROVIDER_SITE_OTHER): Payer: Self-pay | Admitting: Surgery

## 2012-12-31 ENCOUNTER — Encounter: Payer: Self-pay | Admitting: Internal Medicine

## 2012-12-31 ENCOUNTER — Encounter: Payer: Medicaid Other | Admitting: Dietician

## 2013-02-03 ENCOUNTER — Encounter: Payer: Medicaid Other | Admitting: Internal Medicine

## 2013-02-17 ENCOUNTER — Encounter (INDEPENDENT_AMBULATORY_CARE_PROVIDER_SITE_OTHER): Payer: Self-pay | Admitting: Surgery

## 2013-02-17 ENCOUNTER — Ambulatory Visit (INDEPENDENT_AMBULATORY_CARE_PROVIDER_SITE_OTHER): Payer: Medicaid Other | Admitting: Surgery

## 2013-02-17 ENCOUNTER — Telehealth (INDEPENDENT_AMBULATORY_CARE_PROVIDER_SITE_OTHER): Payer: Self-pay | Admitting: Surgery

## 2013-02-17 VITALS — BP 140/98 | HR 88 | Resp 14 | Ht 65.0 in | Wt 278.4 lb

## 2013-02-17 DIAGNOSIS — L089 Local infection of the skin and subcutaneous tissue, unspecified: Secondary | ICD-10-CM

## 2013-02-17 DIAGNOSIS — L723 Sebaceous cyst: Secondary | ICD-10-CM

## 2013-02-17 MED ORDER — HYDROCODONE-ACETAMINOPHEN 5-325 MG PO TABS: 1.0000 | ORAL_TABLET | ORAL | Status: DC | PRN

## 2013-02-17 MED ORDER — DOXYCYCLINE HYCLATE 100 MG PO TABS: 100.0000 mg | ORAL_TABLET | Freq: Two times a day (BID) | ORAL | Status: DC
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
## 2013-02-17 NOTE — Telephone Encounter (Signed)
Pt scheduled for sx 02/27/13 marked urgent Dr Magnus Ivan and patient aware that pt medicaid is only family planning will not pay for this procedure she was given financial obligation information including OV and est SX price

## 2013-02-19 ENCOUNTER — Other Ambulatory Visit (INDEPENDENT_AMBULATORY_CARE_PROVIDER_SITE_OTHER): Payer: Self-pay | Admitting: *Deleted

## 2013-02-19 ENCOUNTER — Telehealth (INDEPENDENT_AMBULATORY_CARE_PROVIDER_SITE_OTHER): Payer: Self-pay | Admitting: *Deleted

## 2013-02-19 MED ORDER — SULFAMETHOXAZOLE-TMP DS 800-160 MG PO TABS: 1.0000 | ORAL_TABLET | Freq: Two times a day (BID) | ORAL | Status: DC

## 2013-02-19 NOTE — Telephone Encounter (Signed)
Annice Pih (pharmacist) with [facility] called to report that Doxycycline is too expense for patient due to no insurance.  I spoke to Dr. Derrell Lolling since Dr. Magnus Ivan is unavailable who reviewed office visit notes and stated that Septra can be ordered for patient.  Order changed at this time and Annice Pih updated.  Annice Pih states Septra will be much more affordable.

## 2013-02-20 ENCOUNTER — Encounter (HOSPITAL_BASED_OUTPATIENT_CLINIC_OR_DEPARTMENT_OTHER): Payer: Self-pay | Admitting: *Deleted

## 2013-02-20 NOTE — Progress Notes (Signed)
Pt goes to [facility] practice-will come in for bmet-ekg-denies sleep apnea-

## 2013-02-26 ENCOUNTER — Encounter (HOSPITAL_BASED_OUTPATIENT_CLINIC_OR_DEPARTMENT_OTHER)
Admission: RE | Admit: 2013-02-26 | Discharge: 2013-02-26 | Disposition: A | Discharge status: Home / Self Care | Payer: Medicaid Other | Source: Ambulatory Visit | Attending: Surgery | Admitting: Surgery

## 2013-02-26 DIAGNOSIS — Z01810 Encounter for preprocedural cardiovascular examination: Secondary | ICD-10-CM | POA: Insufficient documentation

## 2013-02-26 LAB — BASIC METABOLIC PANEL
BUN: 7 mg/dL (ref 6–23)
CO2: 24 mEq/L (ref 19–32)
Chloride: 102 mEq/L (ref 96–112)
Glucose, Bld: 190 mg/dL — ABNORMAL HIGH (ref 70–99)
Potassium: 3.9 mEq/L (ref 3.5–5.1)
Sodium: 136 mEq/L (ref 135–145)
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
## 2013-02-27 ENCOUNTER — Encounter (HOSPITAL_BASED_OUTPATIENT_CLINIC_OR_DEPARTMENT_OTHER)
Admission: RE | Disposition: A | Discharge status: Home / Self Care | Payer: Self-pay | Source: Ambulatory Visit | Attending: Surgery

## 2013-02-27 ENCOUNTER — Ambulatory Visit (HOSPITAL_BASED_OUTPATIENT_CLINIC_OR_DEPARTMENT_OTHER)
Admission: RE | Admit: 2013-02-27 | Discharge: 2013-02-27 | Disposition: A | Discharge status: Home / Self Care | Payer: Self-pay | Source: Ambulatory Visit | Attending: Surgery | Admitting: Surgery

## 2013-02-27 ENCOUNTER — Ambulatory Visit (HOSPITAL_BASED_OUTPATIENT_CLINIC_OR_DEPARTMENT_OTHER): Payer: Medicaid Other | Admitting: *Deleted

## 2013-02-27 ENCOUNTER — Encounter (HOSPITAL_BASED_OUTPATIENT_CLINIC_OR_DEPARTMENT_OTHER): Payer: Self-pay | Admitting: *Deleted

## 2013-02-27 DIAGNOSIS — L723 Sebaceous cyst: Secondary | ICD-10-CM

## 2013-02-27 DIAGNOSIS — I10 Essential (primary) hypertension: Secondary | ICD-10-CM | POA: Insufficient documentation

## 2013-02-27 DIAGNOSIS — E119 Type 2 diabetes mellitus without complications: Secondary | ICD-10-CM | POA: Insufficient documentation

## 2013-02-27 HISTORY — DX: Presence of spectacles and contact lenses: Z97.3

## 2013-02-27 HISTORY — PX: MASS EXCISION: SHX2000

## 2013-02-27 LAB — GLUCOSE, CAPILLARY
Glucose-Capillary: 93 mg/dL (ref 70–99)
Glucose-Capillary: 90 mg/dL (ref 70–99)

## 2013-02-27 LAB — POCT HEMOGLOBIN-HEMACUE: Hemoglobin: 12.1 g/dL (ref 12.0–15.0)

## 2013-02-27 SURGERY — EXCISION MASS
Anesthesia: General | Site: Neck | Wound class: Contaminated

## 2013-02-27 MED ORDER — OXYCODONE HCL 5 MG PO TABS
5.0000 mg | ORAL_TABLET | Freq: Once | ORAL | Status: AC | PRN
  Administered 2013-02-27: 5 mg via ORAL

## 2013-02-27 MED ORDER — LIDOCAINE HCL (CARDIAC) 20 MG/ML IV SOLN
INTRAVENOUS | Status: DC | PRN
  Administered 2013-02-27: 60 mg via INTRAVENOUS

## 2013-02-27 MED ORDER — DEXAMETHASONE SODIUM PHOSPHATE 4 MG/ML IJ SOLN
INTRAMUSCULAR | Status: DC | PRN
  Administered 2013-02-27: 4 mg via INTRAVENOUS

## 2013-02-27 MED ORDER — BUPIVACAINE-EPINEPHRINE 0.5% -1:200000 IJ SOLN
INTRAMUSCULAR | Status: DC | PRN
  Administered 2013-02-27: 10 mL

## 2013-02-27 MED ORDER — PROMETHAZINE HCL 25 MG/ML IJ SOLN: 6.2500 mg | INTRAMUSCULAR | Status: DC | PRN

## 2013-02-27 MED ORDER — SULFAMETHOXAZOLE-TMP DS 800-160 MG PO TABS: 1.0000 | ORAL_TABLET | Freq: Two times a day (BID) | ORAL | Status: DC

## 2013-02-27 MED ORDER — OXYCODONE HCL 5 MG/5ML PO SOLN: 5.0000 mg | Freq: Once | ORAL | Status: AC | PRN

## 2013-02-27 MED ORDER — MEPERIDINE HCL 25 MG/ML IJ SOLN: 6.2500 mg | INTRAMUSCULAR | Status: DC | PRN

## 2013-02-27 MED ORDER — SUCCINYLCHOLINE CHLORIDE 20 MG/ML IJ SOLN
INTRAMUSCULAR | Status: DC | PRN
  Administered 2013-02-27: 100 mg via INTRAVENOUS

## 2013-02-27 MED ORDER — CEFAZOLIN SODIUM-DEXTROSE 2-3 GM-% IV SOLR
2.0000 g | INTRAVENOUS | Status: AC
  Administered 2013-02-27: 2 g via INTRAVENOUS

## 2013-02-27 MED ORDER — HYDROCODONE-ACETAMINOPHEN 5-325 MG PO TABS: 1.0000 | ORAL_TABLET | ORAL | Status: DC | PRN

## 2013-02-27 MED ORDER — FENTANYL CITRATE 0.05 MG/ML IJ SOLN: 25.0000 ug | INTRAMUSCULAR | Status: DC | PRN

## 2013-02-27 MED ORDER — MIDAZOLAM HCL 5 MG/5ML IJ SOLN
INTRAMUSCULAR | Status: DC | PRN
  Administered 2013-02-27: 2 mg via INTRAVENOUS

## 2013-02-27 MED ORDER — ONDANSETRON HCL 4 MG/2ML IJ SOLN
INTRAMUSCULAR | Status: DC | PRN
  Administered 2013-02-27: 4 mg via INTRAVENOUS

## 2013-02-27 MED ORDER — LACTATED RINGERS IV SOLN
INTRAVENOUS | Status: DC
  Administered 2013-02-27 (×2): via INTRAVENOUS

## 2013-02-27 MED ORDER — MIDAZOLAM HCL 2 MG/2ML IJ SOLN: 0.5000 mg | Freq: Once | INTRAMUSCULAR | Status: DC | PRN

## 2013-02-27 MED ORDER — FENTANYL CITRATE 0.05 MG/ML IJ SOLN
INTRAMUSCULAR | Status: DC | PRN
  Administered 2013-02-27: 25 ug via INTRAVENOUS
  Administered 2013-02-27: 100 ug via INTRAVENOUS

## 2013-02-27 MED ORDER — MIDAZOLAM HCL 2 MG/2ML IJ SOLN: 1.0000 mg | INTRAMUSCULAR | Status: DC | PRN

## 2013-02-27 MED ORDER — FENTANYL CITRATE 0.05 MG/ML IJ SOLN: 50.0000 ug | INTRAMUSCULAR | Status: DC | PRN

## 2013-02-27 MED ORDER — PROPOFOL 10 MG/ML IV BOLUS
INTRAVENOUS | Status: DC | PRN
  Administered 2013-02-27: 200 mg via INTRAVENOUS

## 2013-02-27 SURGICAL SUPPLY — 42 items
BENZOIN TINCTURE PRP APPL 2/3 (GAUZE/BANDAGES/DRESSINGS) IMPLANT
BLADE HEX COATED 2.75 (ELECTRODE) ×2 IMPLANT
BLADE SURG 15 STRL LF DISP TIS (BLADE) ×1 IMPLANT
BLADE SURG 15 STRL SS (BLADE) ×1
BLADE SURG ROTATE 9660 (MISCELLANEOUS) IMPLANT
CANISTER SUCTION 1200CC (MISCELLANEOUS) IMPLANT
CHLORAPREP W/TINT 26ML (MISCELLANEOUS) ×2 IMPLANT
CLOTH BEACON ORANGE TIMEOUT ST (SAFETY) ×2 IMPLANT
COVER MAYO STAND STRL (DRAPES) ×2 IMPLANT
COVER TABLE BACK 60X90 (DRAPES) ×2 IMPLANT
DECANTER SPIKE VIAL GLASS SM (MISCELLANEOUS) IMPLANT
DRAPE PED LAPAROTOMY (DRAPES) ×2 IMPLANT
DRAPE UTILITY XL STRL (DRAPES) ×2 IMPLANT
DRSG TEGADERM 4X4.75 (GAUZE/BANDAGES/DRESSINGS) IMPLANT
ELECT REM PT RETURN 9FT ADLT (ELECTROSURGICAL) ×2
ELECTRODE REM PT RTRN 9FT ADLT (ELECTROSURGICAL) ×1 IMPLANT
GAUZE SPONGE 4X4 12PLY STRL LF (GAUZE/BANDAGES/DRESSINGS) ×2 IMPLANT
GLOVE BIOGEL PI IND STRL 7.0 (GLOVE) ×1 IMPLANT
GLOVE BIOGEL PI INDICATOR 7.0 (GLOVE) ×1
GLOVE ECLIPSE 6.5 STRL STRAW (GLOVE) ×2 IMPLANT
GLOVE SURG SIGNA 7.5 PF LTX (GLOVE) ×2 IMPLANT
GOWN PREVENTION PLUS XLARGE (GOWN DISPOSABLE) ×2 IMPLANT
GOWN PREVENTION PLUS XXLARGE (GOWN DISPOSABLE) ×2 IMPLANT
NEEDLE HYPO 25X1 1.5 SAFETY (NEEDLE) ×2 IMPLANT
NS IRRIG 1000ML POUR BTL (IV SOLUTION) IMPLANT
PACK BASIN DAY SURGERY FS (CUSTOM PROCEDURE TRAY) ×2 IMPLANT
PENCIL BUTTON HOLSTER BLD 10FT (ELECTRODE) ×2 IMPLANT
SLEEVE SCD COMPRESS KNEE MED (MISCELLANEOUS) ×2 IMPLANT
SPONGE LAP 4X18 X RAY DECT (DISPOSABLE) IMPLANT
STRIP CLOSURE SKIN 1/2X4 (GAUZE/BANDAGES/DRESSINGS) IMPLANT
SUT ETHILON 2 0 FS 18 (SUTURE) ×2 IMPLANT
SUT MNCRL AB 4-0 PS2 18 (SUTURE) IMPLANT
SUT VIC AB 2-0 SH 27 (SUTURE)
SUT VIC AB 2-0 SH 27XBRD (SUTURE) IMPLANT
SUT VIC AB 3-0 SH 27 (SUTURE)
SUT VIC AB 3-0 SH 27X BRD (SUTURE) IMPLANT
SYR BULB 3OZ (MISCELLANEOUS) IMPLANT
SYR CONTROL 10ML LL (SYRINGE) ×2 IMPLANT
TOWEL OR 17X24 6PK STRL BLUE (TOWEL DISPOSABLE) IMPLANT
TOWEL OR NON WOVEN STRL DISP B (DISPOSABLE) IMPLANT
TUBE CONNECTING 20X1/4 (TUBING) IMPLANT
YANKAUER SUCT BULB TIP NO VENT (SUCTIONS) IMPLANT

## 2013-02-27 NOTE — Anesthesia Procedure Notes (Signed)
Procedure Name: Intubation Date/Time: 02/27/2013 9:14 AM Performed by: Jak Haggar D Pre-anesthesia Checklist: Patient identified, Emergency Drugs available, Suction available and Patient being monitored Patient Re-evaluated:Patient Re-evaluated prior to inductionOxygen Delivery Method: Circle System Utilized Preoxygenation: Pre-oxygenation with 100% oxygen Intubation Type: IV induction Ventilation: Mask ventilation without difficulty Laryngoscope Size: Mac and 3 Grade View: Grade II Tube type: Oral Tube size: 7.0 mm Number of attempts: 1 Airway Equipment and Method: stylet and oral airway Placement Confirmation: ETT inserted through vocal cords under direct vision,  positive ETCO2 and breath sounds checked- equal and bilateral Secured at: 21 cm Tube secured with: Tape Dental Injury: Teeth and Oropharynx as per pre-operative assessment

## 2013-02-27 NOTE — Op Note (Signed)
CYST REMOVAL NECK  Procedure Note  Sharon Fisher 02/27/2013   Pre-op Diagnosis: chronically infected sebaceous cyst, posterior neck      Post-op Diagnosis: same  Procedure(s): EXCISION CHRONIC INFECTED SEBACEOUS CYST POSTERIOR NECK  Surgeon(s): Shelly Rubenstein, MD  Anesthesia: General  Staff:  Circulator: Aleen Sells, RN Scrub Person: Randalyn Rhea, RN  Estimated Blood Loss: Minimal               Specimens: sen to path          [name] A   Date: 02/27/2013  Time: 9:45 AM

## 2013-02-27 NOTE — Interval H&P Note (Signed)
History and Physical Interval Note: no change in h and p  02/27/2013 8:00 AM  Sharon Fisher  has presented today for surgery, with the diagnosis of chronically infected sebacous cyst   The various methods of treatment have been discussed with the patient and family. After consideration of risks, benefits and other options for treatment, the patient has consented to  Procedure(s): CYST REMOVAL NECK (N/A) as a surgical intervention .  The patient's history has been reviewed, patient examined, no change in status, stable for surgery.  I have reviewed the patient's chart and labs.  Questions were answered to the patient's satisfaction.     [name]

## 2013-02-27 NOTE — Anesthesia Preprocedure Evaluation (Addendum)
Anesthesia Evaluation  Patient identified by MRN, date of birth, ID band Patient awake    Reviewed: Allergy & Precautions, H&P , NPO status , Patient's Chart, lab work & pertinent test results  History of Anesthesia Complications Negative for: history of anesthetic complications  Airway Mallampati: II TM Distance: >3 FB Neck ROM: Full    Dental  (+) Dental Advisory Given   Pulmonary asthma ,  breath sounds clear to auscultation  Pulmonary exam normal       Cardiovascular hypertension, Pt. on medications Rhythm:Regular Rate:Normal     Neuro/Psych negative neurological ROS     GI/Hepatic Neg liver ROS, GERD-  Medicated and Controlled,  Endo/Other  diabetes (glu 93), Well Controlled, Type 2, Oral Hypoglycemic AgentsMorbid obesity  Renal/GU negative Renal ROS     Musculoskeletal   Abdominal (+) + obese,   Peds  Hematology   Anesthesia Other Findings   Reproductive/Obstetrics LMP July: preg test NEG                          Anesthesia Physical Anesthesia Plan  ASA: III  Anesthesia Plan: General   Post-op Pain Management:    Induction: Intravenous  Airway Management Planned: Oral ETT  Additional Equipment:   Intra-op Plan:   Post-operative Plan: Extubation in OR  Informed Consent: I have reviewed the patients History and Physical, chart, labs and discussed the procedure including the risks, benefits and alternatives for the proposed anesthesia with the patient or authorized representative who has indicated his/her understanding and acceptance.   Dental advisory given  Plan Discussed with: CRNA and Surgeon  Anesthesia Plan Comments: (Plan routine monitors, GETA)        Anesthesia Quick Evaluation

## 2013-02-27 NOTE — Anesthesia Postprocedure Evaluation (Signed)
  Anesthesia Post-op Note  Patient: Sharon Fisher  Procedure(s) Performed: Procedure(s) with comments: CYST REMOVAL NECK (N/A) - Excision chronic posterior neck cyst  Patient Location: PACU  Anesthesia Type:General  Level of Consciousness: awake, alert , oriented and patient cooperative  Airway and Oxygen Therapy: Patient Spontanous Breathing  Post-op Pain: none  Post-op Assessment: Post-op Vital signs reviewed, Patient's Cardiovascular Status Stable, Respiratory Function Stable, Patent Airway, No signs of Nausea or vomiting, Adequate PO intake and Pain level controlled  Post-op Vital Signs: Reviewed and stable  Complications: No apparent anesthesia complications

## 2013-02-27 NOTE — Transfer of Care (Signed)
Immediate Anesthesia Transfer of Care Note  Patient: Sharon Fisher  Procedure(s) Performed: Procedure(s) with comments: CYST REMOVAL NECK (N/A) - Excision chronic posterior neck cyst  Patient Location: PACU  Anesthesia Type:General  Level of Consciousness: awake, alert , oriented and patient cooperative  Airway & Oxygen Therapy: Patient Spontanous Breathing and Patient connected to face mask oxygen  Post-op Assessment: Report given to PACU RN and Post -op Vital signs reviewed and stable  Post vital signs: Reviewed and stable  Complications: No apparent anesthesia complications

## 2013-02-28 NOTE — Op Note (Signed)
NAMEFEIGE, LOWDERMILK NO.:  [id number]  MEDICAL RECORD NO.:  [id number]  LOCATION:                                 FACILITY:  PHYSICIAN:  Abigail Miyamoto, M.D.      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  PROCEDURES:  Excision of chronically infected sebaceous cyst, posterior neck.  SURGEON:  Abigail Miyamoto, M.D.  ANESTHESIA:  General and 0.5% Marcaine.  ESTIMATED BLOOD LOSS:  Minimal.  INDICATIONS:  This is a 42 year old female who is obese with diabetes who presents with continued recurrent infections of the sebaceous cyst on the back of her neck.  She now presents for removal of this cyst.  PROCEDURE IN DETAIL:  The patient was brought to the operating room, identified as Sharon Fisher.  General anesthesia was induced on the stretcher and then she was placed in a prone position on the operating room table.  Her posterior neck was then prepped and draped in usual sterile fashion.  I made an incision over the sebaceous cyst with a scalpel and entered the cyst which contained a large amount of purulence.  This was irrigated out the wound.  There is a large amount of granulation tissue in the wound which I excised and I excised as much of the cyst is possible.  I was surprised to find that as infected.  I then achieved hemostasis with cautery.  I anesthetized the wound with Marcaine.  I then reapproximated the wound with interrupted 2-0 nylon sutures loosely.  Gauze and tape were then applied.  The patient tolerated the procedure well.  All the counts were correct at the end of procedure.  She was then placed back in a supine position and extubated in the operating room and taken in stable  condition to recovery room.     Abigail Miyamoto, M.D.     DB/MEDQ  D:  02/27/2013  T:  02/28/2013  Job:  [id number]

## 2013-03-02 ENCOUNTER — Encounter (HOSPITAL_BASED_OUTPATIENT_CLINIC_OR_DEPARTMENT_OTHER): Payer: Self-pay | Admitting: Surgery

## 2013-03-03 ENCOUNTER — Encounter: Payer: Medicaid Other | Admitting: Internal Medicine

## 2013-03-03 ENCOUNTER — Encounter: Payer: Self-pay | Admitting: Internal Medicine

## 2013-03-09 ENCOUNTER — Ambulatory Visit (INDEPENDENT_AMBULATORY_CARE_PROVIDER_SITE_OTHER): Payer: Medicaid Other

## 2013-03-09 DIAGNOSIS — Z4802 Encounter for removal of sutures: Secondary | ICD-10-CM

## 2013-03-09 NOTE — Progress Notes (Signed)
Pt comes in today for suture removal post cyst removal of the back of the neck.  The wound looked good.  Cleaned with chloraprep and removed all 4 sutures.  Placed band aid over the incision.

## 2013-03-16 ENCOUNTER — Encounter (INDEPENDENT_AMBULATORY_CARE_PROVIDER_SITE_OTHER): Payer: Self-pay | Admitting: Surgery

## 2013-03-24 ENCOUNTER — Ambulatory Visit (INDEPENDENT_AMBULATORY_CARE_PROVIDER_SITE_OTHER): Payer: Medicaid Other | Admitting: Surgery

## 2013-03-24 ENCOUNTER — Encounter (INDEPENDENT_AMBULATORY_CARE_PROVIDER_SITE_OTHER): Payer: Self-pay | Admitting: Surgery

## 2013-03-24 VITALS — BP 120/90 | HR 88 | Temp 98.3°F | Resp 15 | Ht 65.0 in | Wt 279.8 lb

## 2013-03-24 DIAGNOSIS — Z09 Encounter for follow-up examination after completed treatment for conditions other than malignant neoplasm: Secondary | ICD-10-CM

## 2013-03-24 NOTE — Progress Notes (Signed)
Subjective:     Patient ID: Sharon Fisher, female   DOB: January 27, 1971, 42 y.o.   MRN: [id number]  HPI She is here for a postop visit. 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  Review of Systems     Objective:   Physical Exam On exam, the incision is well healed except open area in the middle. There is no evidence of infection. The pathology was benign    Assessment:     Patient stable postop     Plan:     I will see her back as needed unless the wound does not heal

## 2013-04-13 ENCOUNTER — Encounter (INDEPENDENT_AMBULATORY_CARE_PROVIDER_SITE_OTHER): Payer: Self-pay | Admitting: Surgery

## 2014-02-16 ENCOUNTER — Encounter: Payer: Medicaid Other | Admitting: Internal Medicine

## 2014-03-26 ENCOUNTER — Ambulatory Visit: Payer: Medicaid Other | Admitting: Internal Medicine

## 2014-04-15 ENCOUNTER — Encounter: Payer: Self-pay | Admitting: Internal Medicine

## 2014-04-15 ENCOUNTER — Ambulatory Visit (INDEPENDENT_AMBULATORY_CARE_PROVIDER_SITE_OTHER): Payer: Medicaid Other | Admitting: Internal Medicine

## 2014-04-15 ENCOUNTER — Ambulatory Visit (INDEPENDENT_AMBULATORY_CARE_PROVIDER_SITE_OTHER): Payer: Self-pay | Admitting: *Deleted

## 2014-04-15 VITALS — BP 126/74 | HR 97 | Temp 98.3°F | Ht 65.5 in | Wt 276.9 lb

## 2014-04-15 DIAGNOSIS — I10 Essential (primary) hypertension: Secondary | ICD-10-CM

## 2014-04-15 DIAGNOSIS — E119 Type 2 diabetes mellitus without complications: Secondary | ICD-10-CM

## 2014-04-15 DIAGNOSIS — Z23 Encounter for immunization: Secondary | ICD-10-CM

## 2014-04-15 DIAGNOSIS — B373 Candidiasis of vulva and vagina: Secondary | ICD-10-CM | POA: Insufficient documentation

## 2014-04-15 DIAGNOSIS — L298 Other pruritus: Secondary | ICD-10-CM

## 2014-04-15 DIAGNOSIS — B3731 Acute candidiasis of vulva and vagina: Secondary | ICD-10-CM | POA: Insufficient documentation

## 2014-04-15 DIAGNOSIS — N898 Other specified noninflammatory disorders of vagina: Secondary | ICD-10-CM

## 2014-04-15 LAB — BASIC METABOLIC PANEL WITH GFR
BUN: 9 mg/dL (ref 6–23)
CALCIUM: 9.4 mg/dL (ref 8.4–10.5)
CO2: 23 meq/L (ref 19–32)
CREATININE: 0.63 mg/dL (ref 0.50–1.10)
Chloride: 101 mEq/L (ref 96–112)
GFR, Est African American: >89 mL/min
GFR, Est Non African American: >89 mL/min
Glucose, Bld: 207 mg/dL — ABNORMAL HIGH (ref 70–99)
Potassium: 3.8 mEq/L (ref 3.5–5.3)
Sodium: 137 mEq/L (ref 135–145)

## 2014-04-15 LAB — LIPID PANEL
Cholesterol: 144 mg/dL (ref 0–200)
HDL: 33 mg/dL — AB (ref >39)
LDL Cholesterol: 83 mg/dL (ref 0–99)
TRIGLYCERIDES: 138 mg/dL (ref <150)
Total CHOL/HDL Ratio: 4.4 Ratio
VLDL: 28 mg/dL (ref 0–40)

## 2014-04-15 LAB — GLUCOSE, CAPILLARY: Glucose-Capillary: 188 mg/dL — ABNORMAL HIGH (ref 70–99)

## 2014-04-15 LAB — POCT GLYCOSYLATED HEMOGLOBIN (HGB A1C): HEMOGLOBIN A1C: 8.7

## 2014-04-15 MED ORDER — GLIPIZIDE 5 MG PO TABS: 5.0000 mg | ORAL_TABLET | Freq: Every day | ORAL | Status: DC

## 2014-04-15 MED ORDER — ENALAPRIL MALEATE 10 MG PO TABS: 10.0000 mg | ORAL_TABLET | Freq: Every day | ORAL | Status: DC

## 2014-04-15 MED ORDER — FLUCONAZOLE 150 MG PO TABS: 150.0000 mg | ORAL_TABLET | Freq: Every day | ORAL | Status: DC

## 2014-04-15 MED ORDER — METFORMIN HCL ER 500 MG PO TB24: 1000.0000 mg | ORAL_TABLET | Freq: Every day | ORAL | Status: DC

## 2014-04-15 NOTE — Progress Notes (Signed)
Patient ID: Sharon Fisher, female   DOB: 11/17/1970, 43 y.o.   MRN: [id number] [facility] Attending  Case discussed with Dr. Ronnald Ramp at the time of the visit.  We reviewed the resident's history and exam and pertinent patient test results.  I agree with the assessment, diagnosis, and plan of care documented in the resident's note.

## 2014-04-15 NOTE — Progress Notes (Signed)
Subjective:   Patient ID: Sharon Fisher female   DOB: 08/23/1970 43 y.o.   MRN: [id number]  HPI: [name] is a 43 y.o. female w/ PMHx of DM type II, HTN, GERD, presents to the clinic today for a follow-up visit. Patient has not been seen in the clinic in quite some time (since 07/2012) and claims that she has been out of all her medicines fror quite some time. She had previously been taking enalapril, glipizide, and metformin and has only been taking 1000 mg metformin that she gets from her husband. She denies any significant complaints, no recent change in vision, headaches, dizziness, tremors, diaphoresis, abdominal pain, nausea, or vomiting. She does not check her blood sugar.  She does say that she has had some recent vaginal discomfort including pruritis and burning. She also admits to some thick, whitish discharge. She tried some over the counter creams for yeast infection, but says that some of her symptoms (pruritis) are still present. No dysuria, suprapubic discomfort, or increased urinary frequency.   Past Medical History  Diagnosis Date  . Diabetes mellitus   . Allergic rhinitis   . Hypertension   . Hip pain, bilateral     ? lipomas  . Lymphedema     chronic, since age 70  . Mammogram abnormal     06, s/p biopsy of mass and neg work up for neoplasm  . Right ovarian cyst     s/p resection of r ovary 06  . Polyarthropathy     inflammatory  . Irregular menstruation   . GERD (gastroesophageal reflux disease)   . Wears glasses    Current Outpatient Prescriptions  Medication Sig Dispense Refill  . ACCU-CHEK FASTCLIX LANCETS MISC 1 each by Does not apply route 2 (two) times daily. Use to check blood sugar twice a day. Diagnosis code 250.00 100 each 11  . enalapril (VASOTEC) 10 MG tablet Take 1 tablet (10 mg total) by mouth daily. 30 tablet 2  . gabapentin (NEURONTIN) 300 MG capsule Take 1 capsule (300 mg total) by mouth 2 (two) times daily. 60 capsule 3  . glipiZIDE  (GLUCOTROL) 10 MG tablet Take 1 tablet (10 mg total) by mouth 2 (two) times daily before a meal. 60 tablet 2  . HYDROcodone-acetaminophen (NORCO) 5-325 MG per tablet Take 1-2 tablets by mouth every 4 (four) hours as needed for pain. 30 tablet 1  . metFORMIN (GLUCOPHAGE) 1000 MG tablet Take 1 tablet (1,000 mg total) by mouth 2 (two) times daily with a meal. 60 tablet 2  . sulfamethoxazole-trimethoprim (BACTRIM DS) 800-160 MG per tablet Take 1 tablet by mouth 2 (two) times daily. 14 tablet 1   No current facility-administered medications for this visit.    Review of Systems: General: Denies fever, chills, diaphoresis, appetite change and fatigue.  Respiratory: Denies SOB, DOE, cough, and wheezing.   Cardiovascular: Denies chest pain and palpitations.  Gastrointestinal: Denies nausea, vomiting, abdominal pain, and diarrhea.  Genitourinary: Positive for vaginal discomfort. Denies dysuria, increased frequency, and flank pain. Endocrine: Denies hot or cold intolerance, polyuria, and polydipsia. Musculoskeletal: Denies myalgias, back pain, joint swelling, arthralgias and gait problem.  Skin: Denies pallor, rash and wounds.  Neurological: Denies dizziness, seizures, syncope, weakness, lightheadedness, numbness and headaches.  Psychiatric/Behavioral: Denies mood changes, and sleep disturbances.  Objective:   Physical Exam: Filed Vitals:   04/15/14 1610  BP: 126/74  Pulse: 97  Temp: 98.3 F (36.8 C)  TempSrc: Oral  Height: 5' 5.5" (1.664  m)  Weight: 276 lb 14.4 oz (125.601 kg)  SpO2: 100%   Physical Exam: General: Obese female, alert, cooperative, NAD. HEENT: PERRL, EOMI. Moist mucus membranes Neck: Full range of motion without pain, supple, no lymphadenopathy or carotid bruits Lungs: Clear to ascultation bilaterally, normal work of respiration, no wheezes, rales, rhonchi Heart: RRR, no murmurs, gallops, or rubs Abdomen: Soft, non-tender, non-distended, BS + Extremities: No cyanosis,  clubbing, or edema Neurologic: Alert & oriented X3, cranial nerves II-XII intact, strength grossly intact, sensation intact to light touch   Assessment & Plan:   Please see problem based assessment and plan.

## 2014-04-15 NOTE — Patient Instructions (Signed)
General Instructions:  1. Please schedule a follow up appointment for 6-8 weeks.   2. Please take all medications as prescribed.   Take Metformin XR 1000 mg daily (at night).   Take Glipizide 5 mg in the AM w/ breakfast. DO NOT miss breakfast when taking this medication.   Take Diflucan 150 mg once for vaginal discomfort.    3. If you have worsening of your symptoms or new symptoms arise, please call the clinic ([phone]), or go to the ER immediately if symptoms are severe.   Please bring your medicines with you each time you come to clinic.  Medicines may include prescription medications, over-the-counter medications, herbal remedies, eye drops, vitamins, or other pills.   Progress Toward Treatment Goals:  Treatment Goal 04/15/2014  Hemoglobin A1C unchanged  Blood pressure at goal    Self Care Goals & Plans:  Self Care Goal 04/15/2014  Manage my medications take my medicines as prescribed; bring my medications to every visit; refill my medications on time  Eat healthy foods drink diet soda or water instead of juice or soda; eat more vegetables; eat foods that are low in salt; eat baked foods instead of fried foods; eat fruit for snacks and desserts    Home Blood Glucose Monitoring 04/15/2014  Check my blood sugar no home glucose monitoring     Care Management & Community Referrals:  Referral 04/15/2014  Referrals made for care management support none needed

## 2014-04-16 ENCOUNTER — Encounter: Payer: Self-pay | Admitting: Internal Medicine

## 2014-04-16 NOTE — Assessment & Plan Note (Signed)
BP Readings from Last 3 Encounters:  04/15/14 126/74  03/24/13 120/90  02/27/13 133/84    Lab Results  Component Value Date   NA 137 04/15/2014   K 3.8 04/15/2014   CREATININE 0.63 04/15/2014    Assessment: Blood pressure control: controlled Progress toward BP goal:  at goal Comments: Patient has been taking Vasotek 10 mg qd but has been out for several months. BP well controlled currently but would likely benefit from renal effects of ACEI.  Plan: Medications:  continue current medications Educational resources provided: brochure (refused information) Self management tools provided:   Other plans: RTC in 2 months.

## 2014-04-16 NOTE — Assessment & Plan Note (Addendum)
Lab Results  Component Value Date   HGBA1C 8.7 04/15/2014   HGBA1C 8.7 07/15/2012   HGBA1C 8.2 09/19/2011     Assessment: Diabetes control: fair control Progress toward A1C goal:  unchanged Comments: HbA1c unchanged from previous clinic visit over 1 year ago. Had been taking metformin 1000 mg daily and glipizide 10 mg twice daily. Does not check her CBG's, does not want to.   Plan: Medications:  Change Metformin to XR formulation; Start metformin XR 1000 mg for better GI tolerace. Restart Glipizide 5 mg w/ breakfast. Discussed hypoglycemia and signs/symptoms. Patient understands that she is to eat with this.  Home glucose monitoring: Frequency: no home glucose monitoring Timing:  none Instruction/counseling given: discussed the need for weight loss Educational resources provided: brochure (refused information) Self management tools provided:   Other plans: Patient will return to clinic after 06/04/14 for CBG re-check. Will consider making changes at that time if necessary. BMP without abnormalities.  Foot exam today

## 2014-04-16 NOTE — Assessment & Plan Note (Signed)
Patient describes classic symptoms of vaginal candida infection. No dysuria, flank pain, or increased frequency. Pruritis has been quite persistent. No recent unprotected sex.  -Diflucan 150 mg once -Patient to call or return to clinic if symptoms persist.

## 2014-04-16 NOTE — Assessment & Plan Note (Signed)
Flu shot given today

## 2014-04-21 ENCOUNTER — Ambulatory Visit: Payer: Medicaid Other

## 2014-05-12 ENCOUNTER — Telehealth: Payer: Self-pay | Admitting: Dietician

## 2014-05-18 NOTE — Telephone Encounter (Signed)
Unable to reach patient by phone 

## 2014-05-19 ENCOUNTER — Ambulatory Visit: Payer: Medicaid Other

## 2014-07-19 ENCOUNTER — Encounter (HOSPITAL_COMMUNITY): Payer: Self-pay

## 2014-07-19 ENCOUNTER — Emergency Department (HOSPITAL_COMMUNITY)
Admission: EM | Admit: 2014-07-19 | Discharge: 2014-07-19 | Disposition: A | Discharge status: Home / Self Care | Payer: Medicaid Other | Attending: Emergency Medicine | Admitting: Emergency Medicine

## 2014-07-19 DIAGNOSIS — E119 Type 2 diabetes mellitus without complications: Secondary | ICD-10-CM | POA: Insufficient documentation

## 2014-07-19 DIAGNOSIS — Y929 Unspecified place or not applicable: Secondary | ICD-10-CM | POA: Insufficient documentation

## 2014-07-19 DIAGNOSIS — Z87891 Personal history of nicotine dependence: Secondary | ICD-10-CM | POA: Insufficient documentation

## 2014-07-19 DIAGNOSIS — S39012A Strain of muscle, fascia and tendon of lower back, initial encounter: Secondary | ICD-10-CM

## 2014-07-19 DIAGNOSIS — Z8709 Personal history of other diseases of the respiratory system: Secondary | ICD-10-CM | POA: Insufficient documentation

## 2014-07-19 DIAGNOSIS — Y939 Activity, unspecified: Secondary | ICD-10-CM | POA: Insufficient documentation

## 2014-07-19 DIAGNOSIS — Y998 Other external cause status: Secondary | ICD-10-CM | POA: Insufficient documentation

## 2014-07-19 DIAGNOSIS — Z79899 Other long term (current) drug therapy: Secondary | ICD-10-CM | POA: Insufficient documentation

## 2014-07-19 DIAGNOSIS — X58XXXA Exposure to other specified factors, initial encounter: Secondary | ICD-10-CM | POA: Insufficient documentation

## 2014-07-19 DIAGNOSIS — Z8719 Personal history of other diseases of the digestive system: Secondary | ICD-10-CM | POA: Insufficient documentation

## 2014-07-19 DIAGNOSIS — I10 Essential (primary) hypertension: Secondary | ICD-10-CM | POA: Insufficient documentation

## 2014-07-19 DIAGNOSIS — Z8742 Personal history of other diseases of the female genital tract: Secondary | ICD-10-CM | POA: Insufficient documentation

## 2014-07-19 MED ORDER — MELOXICAM 7.5 MG PO TABS: 7.5000 mg | ORAL_TABLET | Freq: Every day | ORAL | Status: DC

## 2014-07-19 MED ORDER — DIAZEPAM 5 MG PO TABS: 5.0000 mg | ORAL_TABLET | Freq: Two times a day (BID) | ORAL | Status: DC

## 2014-07-19 MED ORDER — KETOROLAC TROMETHAMINE 30 MG/ML IJ SOLN
30.0000 mg | Freq: Once | INTRAMUSCULAR | Status: AC
  Administered 2014-07-19: 30 mg via INTRAMUSCULAR
  Filled 2014-07-19: qty 1

## 2014-07-19 NOTE — ED Notes (Signed)
Pt reporting lower back pain that started after she bent down to pick something up.  Described as a "burning numbing" pain.  Pt denies any numbness to lower extremities; no incontinence reported.  Ambulatory in triage.  Sts pain is more to right lower back versus left.

## 2014-07-19 NOTE — Discharge Instructions (Signed)

## 2014-07-19 NOTE — ED Provider Notes (Signed)
CSN: [id number]     Arrival date & time 07/19/14  1716 History   First MD Initiated Contact with Patient 07/19/14 1728    This chart was scribed for non-physician practitioner, Starlyn Skeans, PA, working with Orlie Dakin, MD by Terressa Koyanagi, ED Scribe. This patient was seen in room TR10C/TR10C and the patient's care was started at 5:50 PM.   Chief Complaint  Patient presents with  . Back Pain    HPI PCP: Luanne Bras, MD HPI Comments: Sharon Fisher is a 44 y.o. female, with PMH and home meds noted below, who presents to the Emergency Department complaining of sudden onset, constant right-sided, "burning/numbing," sharp lower back pain onset this morning. Pt reports taking tylenol at home with little relief. Pt denies any numbness to her lower extremities, fecal/urinary incontinence, Hx of similar pain, IV drug use, Hx of cancer, Hx of osteoporosis/frequent fractures, tingling/numbness in genital region.  Past Medical History  Diagnosis Date  . Diabetes mellitus   . Allergic rhinitis   . Hypertension   . Hip pain, bilateral     ? lipomas  . Lymphedema     chronic, since age 30  . Mammogram abnormal     06, s/p biopsy of mass and neg work up for neoplasm  . Right ovarian cyst     s/p resection of r ovary 06  . Polyarthropathy     inflammatory  . Irregular menstruation   . GERD (gastroesophageal reflux disease)   . Wears glasses    Past Surgical History  Procedure Laterality Date  . Tonsillectomy    . Surgery of fibroids      but still has fibroids  . Dilation and curettage of uterus    . Breast reduction surgery  1996  . Mass excision N/A 02/27/2013    Procedure: CYST REMOVAL NECK;  Surgeon: Harl Bowie, MD;  Location: [facility];  Service: General;  Laterality: N/A;  Excision chronic posterior neck cyst   Family History  Problem Relation Age of Onset  . Hypertension Mother   . Cancer Mother     breast cancer  . Anemia Mother   . Sickle cell  trait Sister   . Cancer Maternal Uncle     breast cancer  . Cancer Paternal Grandmother     lung cancer  . Hypertension Paternal Grandmother   . Diabetes Paternal Grandmother    History  Substance Use Topics  . Smoking status: Former Research scientist (life sciences)  . Smokeless tobacco: Former Systems developer     Comment: [place].  Marland Kitchen Alcohol Use: No   OB History    No data available     Review of Systems  Genitourinary: Negative for urgency.  Musculoskeletal: Positive for back pain.  Neurological: Negative for numbness.  Psychiatric/Behavioral: Negative for confusion.   Allergies  Shellfish allergy  Home Medications   Prior to Admission medications   Medication Sig Start Date End Date Taking? Authorizing Provider  ACCU-CHEK FASTCLIX LANCETS MISC 1 each by Does not apply route 2 (two) times daily. Use to check blood sugar twice a day. Diagnosis code 250.00 01/03/11   Pedro Earls, MD  diazepam (VALIUM) 5 MG tablet Take 1 tablet (5 mg total) by mouth 2 (two) times daily. 07/19/14   Tierra Thoma A Forcucci, PA-C  enalapril (VASOTEC) 10 MG tablet Take 1 tablet (10 mg total) by mouth daily. 04/15/14   Corky Sox, MD  fluconazole (DIFLUCAN) 150 MG tablet Take 1 tablet (150 mg total) by  mouth daily. 04/15/14   Corky Sox, MD  gabapentin (NEURONTIN) 300 MG capsule Take 1 capsule (300 mg total) by mouth 2 (two) times daily. 07/15/12   Rosalia Hammers, MD  glipiZIDE (GLUCOTROL) 5 MG tablet Take 1 tablet (5 mg total) by mouth daily before breakfast. 04/15/14 04/15/15  Corky Sox, MD  HYDROcodone-acetaminophen (NORCO) 5-325 MG per tablet Take 1-2 tablets by mouth every 4 (four) hours as needed for pain. 02/27/13   Coralie Keens, MD  meloxicam (MOBIC) 7.5 MG tablet Take 1 tablet (7.5 mg total) by mouth daily. 07/19/14   Reo Portela A Forcucci, PA-C  metFORMIN (GLUCOPHAGE XR) 500 MG 24 hr tablet Take 2 tablets (1,000 mg total) by mouth daily with breakfast. 04/15/14   Corky Sox, MD   Triage Vitals: BP 172/98 mmHg  Pulse  101  Temp(Src) 98.8 F (37.1 C)  Resp 18  SpO2 97% Physical Exam  Constitutional: She is oriented to person, place, and time. She appears well-developed and well-nourished. No distress.  HENT:  Head: Normocephalic and atraumatic.  Mouth/Throat: Oropharynx is clear and moist.  Eyes: Conjunctivae and EOM are normal. Pupils are equal, round, and reactive to light.  Neck: Normal range of motion. Neck supple. No JVD present. No thyromegaly present.  Cardiovascular: Normal rate, regular rhythm, normal heart sounds and intact distal pulses.  Exam reveals no gallop and no friction rub.   No murmur heard. Pulmonary/Chest: Effort normal and breath sounds normal. No respiratory distress. She has no wheezes. She has no rales. She exhibits no tenderness.  Abdominal: Soft. Bowel sounds are normal. She exhibits no distension and no mass. There is no tenderness. There is no rebound and no guarding.  Musculoskeletal: Normal range of motion. She exhibits tenderness.  Patient rises slowly from sitting to standing.  They walk without an antalgic gait.  There is no evidence of erythema, ecchymosis, or gross deformity.  There is tenderness to palpation over right lumbar paraspinal muscles with specific tenderness especially over the insertion at the PSIS.  There is no bony tenderness. Active ROM is mildly limited with forward flexion but is full in all other fields with movement.  Sensation to light touch is intact over all extremities.  Strength is symmetric and equal in all extremities.     Lymphadenopathy:    She has no cervical adenopathy.  Neurological: She is alert and oriented to person, place, and time. She has normal strength. No cranial nerve deficit or sensory deficit. Coordination and gait normal.  Skin: Skin is warm and dry.  Psychiatric: She has a normal mood and affect. Her behavior is normal. Judgment and thought content normal.  Nursing note and vitals reviewed.   ED Course  Procedures  (including critical care time) DIAGNOSTIC STUDIES: Oxygen Saturation is 97% on RA, nl by my interpretation.    COORDINATION OF CARE: 5:56 PM-Discussed treatment plan which includes meds, lab review, and restriction on heavy lifting, and f/u with her PCP, with pt at bedside and pt agreed to plan. Pt reports she has a ride home. Pt advised to return to the ED immediately if Sx worsen.   Labs Review Labs Reviewed - No data to display  Imaging Review No results found.   EKG Interpretation None      MDM   Final diagnoses:  Lumbar strain, initial encounter   Patient's 44 year old female with no history of back pain who presents after ending forward and starting to feel sharp pain in the right lumbar area. There  no evidence for cauda equina at this time. There are no neurological deficits. Patient does have tenderness over the right lumbar paraspinal muscles. There is no bony tenderness at this time. Given no bony tenderness will not get any x-rays at this point. We'll discharge home with Modic and Valium as needed for pain. Will given work note restricting heavy lifting greater than 25 pounds. I have told patient to avoid bending over and twisting activities. He is to return for signs of cauda equina which we discussed. Given a Toradol injection here. Patient has declined muscle relaxers here. Patient does have normal serum creatinine as of 04/2014.  I personally performed the services described in this documentation, which was scribed in my presence. The recorded information has been reviewed and is accurate.    Cherylann Parr, PA-C 07/19/14 1806  Cherylann Parr, PA-C 07/19/14 1810  Orlie Dakin, MD 07/20/14 0030

## 2014-07-19 NOTE — ED Notes (Signed)
Pt made aware to return if symptoms worsen or if any life threatening symptoms occur.   

## 2014-10-25 ENCOUNTER — Other Ambulatory Visit: Payer: Self-pay

## 2014-11-11 ENCOUNTER — Other Ambulatory Visit: Payer: Self-pay | Admitting: *Deleted

## 2014-11-11 DIAGNOSIS — I10 Essential (primary) hypertension: Secondary | ICD-10-CM

## 2014-11-11 DIAGNOSIS — E119 Type 2 diabetes mellitus without complications: Secondary | ICD-10-CM

## 2014-11-12 ENCOUNTER — Ambulatory Visit (INDEPENDENT_AMBULATORY_CARE_PROVIDER_SITE_OTHER): Payer: Self-pay | Admitting: Pulmonary Disease

## 2014-11-12 ENCOUNTER — Encounter: Payer: Self-pay | Admitting: Pulmonary Disease

## 2014-11-12 VITALS — BP 156/85 | HR 98 | Temp 98.0°F | Ht 65.5 in | Wt 280.2 lb

## 2014-11-12 DIAGNOSIS — I10 Essential (primary) hypertension: Secondary | ICD-10-CM

## 2014-11-12 DIAGNOSIS — IMO0002 Reserved for concepts with insufficient information to code with codable children: Secondary | ICD-10-CM

## 2014-11-12 DIAGNOSIS — B373 Candidiasis of vulva and vagina: Secondary | ICD-10-CM

## 2014-11-12 DIAGNOSIS — Z79899 Other long term (current) drug therapy: Secondary | ICD-10-CM

## 2014-11-12 DIAGNOSIS — B3731 Acute candidiasis of vulva and vagina: Secondary | ICD-10-CM

## 2014-11-12 DIAGNOSIS — E1165 Type 2 diabetes mellitus with hyperglycemia: Secondary | ICD-10-CM

## 2014-11-12 LAB — GLUCOSE, CAPILLARY: GLUCOSE-CAPILLARY: 405 mg/dL — AB (ref 65–99)

## 2014-11-12 LAB — POCT GLYCOSYLATED HEMOGLOBIN (HGB A1C): HEMOGLOBIN A1C: 9.9

## 2014-11-12 MED ORDER — GABAPENTIN 300 MG PO CAPS: 300.0000 mg | ORAL_CAPSULE | Freq: Two times a day (BID) | ORAL | Status: DC

## 2014-11-12 MED ORDER — METFORMIN HCL ER 500 MG PO TB24: 1000.0000 mg | ORAL_TABLET | Freq: Every day | ORAL | Status: DC

## 2014-11-12 MED ORDER — GLIPIZIDE 5 MG PO TABS: 5.0000 mg | ORAL_TABLET | Freq: Every day | ORAL | Status: DC

## 2014-11-12 MED ORDER — FLUCONAZOLE 150 MG PO TABS: 150.0000 mg | ORAL_TABLET | Freq: Once | ORAL | Status: DC

## 2014-11-12 MED ORDER — ENALAPRIL MALEATE 10 MG PO TABS: 10.0000 mg | ORAL_TABLET | Freq: Every day | ORAL | Status: DC

## 2014-11-12 MED ORDER — ACCU-CHEK FASTCLIX LANCETS MISC: 1.0000 | Freq: Two times a day (BID) | Status: DC

## 2014-11-12 NOTE — Assessment & Plan Note (Signed)
BP Readings from Last 3 Encounters:  11/12/14 156/85  07/19/14 172/98  04/15/14 126/74    Lab Results  Component Value Date   NA 137 04/15/2014   K 3.8 04/15/2014   CREATININE 0.63 04/15/2014    Assessment: Blood pressure control: mildly elevated Progress toward BP goal:  unchanged  Plan: Medications:  Refilled enalapril 10mg  daily Other plans:  -Follow up in 1 month

## 2014-11-12 NOTE — Patient Instructions (Signed)
General Instructions:   Please bring your medicines with you each time you come to clinic.  Medicines may include prescription medications, over-the-counter medications, herbal remedies, eye drops, vitamins, or other pills.   Progress Toward Treatment Goals:  Treatment Goal 11/12/2014  Hemoglobin A1C deteriorated  Blood pressure unchanged    Self Care Goals & Plans:  Self Care Goal 11/12/2014  Manage my medications take my medicines as prescribed; bring my medications to every visit; refill my medications on time  Monitor my health keep track of my blood glucose; bring my glucose meter and log to each visit; keep track of my blood pressure; check my feet daily  Eat healthy foods eat more vegetables; eat foods that are low in salt; eat baked foods instead of fried foods  Be physically active find an activity I enjoy; take a walk every day

## 2014-11-12 NOTE — Assessment & Plan Note (Addendum)
Lab Results  Component Value Date   HGBA1C 9.9 11/12/2014   HGBA1C 8.7 04/15/2014   HGBA1C 8.7 07/15/2012     Assessment: Diabetes control: poor control (HgbA1C >9%) Progress toward A1C goal:  deteriorated  Plan: Medications:  Refilled Rx for metformin 1000mg  XR daily and glipizide 5mg  daily Other plans:  -Follow up in 1 month with meter -If remains uncontrolled, may consider increase metformin XR to 1500mg  daily

## 2014-11-12 NOTE — Assessment & Plan Note (Signed)
Likely vulvovaginal candidiasis. Uncomplicated. Similar to episode 04/16/2014.  Plan: -Fluconazole 150mg  once

## 2014-11-12 NOTE — Progress Notes (Signed)
[facility] Medicine Clinic Attending  Case discussed with Dr. Krall at the time of the visit.  We reviewed the resident's history and exam and pertinent patient test results.  I agree with the assessment, diagnosis, and plan of care documented in the resident's note.  

## 2014-11-12 NOTE — Progress Notes (Signed)
   Subjective:   Patient ID: Sharon Fisher, female    DOB: [birth year], 44 y.o.   MRN: [id number]  HPI Ms. CHIANTI GOH is a 44 year old woman with history of DM, HTN, GERD presenting for evaluation.  She reports vaginal itching for the past 3-4 days. She has had white thick discharge. Denies odor. No fevers/chills or dysuria. Denies concerns for STD - is sexually active with her husband.  She reports she has not been taking her medications for DM or HTN for the past two months because she ran out.  Review of Systems Constitutional: no fevers/chills Eyes: no vision changes Ears, nose, mouth, throat, and face: no cough Respiratory: no shortness of breath Cardiovascular: no chest pain Gastrointestinal: no nausea/vomiting, no abdominal pain, no constipation, no diarrhea Genitourinary: no dysuria, no hematuria Integument: no rash Hematologic/lymphatic: no bleeding/bruising, no edema Musculoskeletal: no arthralgias, no myalgias Neurological: no paresthesias, no weakness  Past Medical History  Diagnosis Date  . Diabetes mellitus   . Allergic rhinitis   . Hypertension   . Hip pain, bilateral     ? lipomas  . Lymphedema     chronic, since age 14  . Mammogram abnormal     06, s/p biopsy of mass and neg work up for neoplasm  . Right ovarian cyst     s/p resection of r ovary 06  . Polyarthropathy     inflammatory  . Irregular menstruation   . GERD (gastroesophageal reflux disease)   . Wears glasses     No current outpatient prescriptions on file prior to visit.   No current facility-administered medications on file prior to visit.    Today's Vitals   11/12/14 1558  BP: 156/85  Pulse: 98  Temp: 98 F (36.7 C)  TempSrc: Oral  Height: 5' 5.5" (1.664 m)  Weight: 280 lb 3.2 oz (127.098 kg)  SpO2: 98%    Objective:  Physical Exam  Constitutional: She is oriented to person, place, and time. She appears well-developed and well-nourished.  HENT:  Head: Normocephalic and  atraumatic.  Eyes: Conjunctivae are normal.  Neck: Normal range of motion.  Cardiovascular: Normal rate and regular rhythm.   Pulmonary/Chest: Effort normal. She has no wheezes. She has no rales.  Abdominal: Soft. There is no tenderness.  Genitourinary: There is erythema in the vagina. No vaginal discharge found.  Musculoskeletal: Normal range of motion.  Neurological: She is alert and oriented to person, place, and time.  Skin: Skin is warm and dry.  Psychiatric: She has a normal mood and affect.    Assessment & Plan:  Please refer to problem based charting.

## 2015-04-11 ENCOUNTER — Other Ambulatory Visit: Payer: Self-pay

## 2015-04-15 ENCOUNTER — Encounter: Payer: Self-pay | Admitting: Internal Medicine

## 2015-04-15 ENCOUNTER — Ambulatory Visit (INDEPENDENT_AMBULATORY_CARE_PROVIDER_SITE_OTHER): Payer: Self-pay | Admitting: Internal Medicine

## 2015-04-15 VITALS — BP 134/76 | HR 90 | Temp 98.4°F | Ht 65.0 in | Wt 282.0 lb

## 2015-04-15 DIAGNOSIS — R1032 Left lower quadrant pain: Secondary | ICD-10-CM

## 2015-04-15 DIAGNOSIS — M545 Low back pain, unspecified: Secondary | ICD-10-CM

## 2015-04-15 DIAGNOSIS — M549 Dorsalgia, unspecified: Secondary | ICD-10-CM | POA: Insufficient documentation

## 2015-04-15 DIAGNOSIS — B373 Candidiasis of vulva and vagina: Secondary | ICD-10-CM

## 2015-04-15 DIAGNOSIS — L723 Sebaceous cyst: Secondary | ICD-10-CM

## 2015-04-15 DIAGNOSIS — Z83511 Family history of glaucoma: Secondary | ICD-10-CM

## 2015-04-15 DIAGNOSIS — E119 Type 2 diabetes mellitus without complications: Secondary | ICD-10-CM

## 2015-04-15 DIAGNOSIS — Z7984 Long term (current) use of oral hypoglycemic drugs: Secondary | ICD-10-CM

## 2015-04-15 DIAGNOSIS — R5383 Other fatigue: Secondary | ICD-10-CM

## 2015-04-15 DIAGNOSIS — R1031 Right lower quadrant pain: Secondary | ICD-10-CM

## 2015-04-15 DIAGNOSIS — R5382 Chronic fatigue, unspecified: Secondary | ICD-10-CM

## 2015-04-15 DIAGNOSIS — E1165 Type 2 diabetes mellitus with hyperglycemia: Secondary | ICD-10-CM

## 2015-04-15 DIAGNOSIS — IMO0001 Reserved for inherently not codable concepts without codable children: Secondary | ICD-10-CM

## 2015-04-15 DIAGNOSIS — B3731 Acute candidiasis of vulva and vagina: Secondary | ICD-10-CM

## 2015-04-15 DIAGNOSIS — I10 Essential (primary) hypertension: Secondary | ICD-10-CM

## 2015-04-15 HISTORY — DX: Other fatigue: R53.83

## 2015-04-15 HISTORY — DX: Dorsalgia, unspecified: M54.9

## 2015-04-15 LAB — POCT GLYCOSYLATED HEMOGLOBIN (HGB A1C): Hemoglobin A1C: 9.1

## 2015-04-15 LAB — GLUCOSE, CAPILLARY: Glucose-Capillary: 235 mg/dL — ABNORMAL HIGH (ref 65–99)

## 2015-04-15 LAB — HM DIABETES EYE EXAM

## 2015-04-15 MED ORDER — GABAPENTIN 300 MG PO CAPS: 300.0000 mg | ORAL_CAPSULE | Freq: Two times a day (BID) | ORAL | Status: DC

## 2015-04-15 MED ORDER — ENALAPRIL MALEATE 10 MG PO TABS: 10.0000 mg | ORAL_TABLET | Freq: Every day | ORAL | Status: DC

## 2015-04-15 MED ORDER — FLUCONAZOLE 150 MG PO TABS: 150.0000 mg | ORAL_TABLET | ORAL | Status: DC

## 2015-04-15 MED ORDER — GLIPIZIDE 5 MG PO TABS: 5.0000 mg | ORAL_TABLET | Freq: Every day | ORAL | Status: DC

## 2015-04-15 MED ORDER — NAPROXEN 500 MG PO TABS: 500.0000 mg | ORAL_TABLET | Freq: Two times a day (BID) | ORAL | Status: DC

## 2015-04-15 MED ORDER — METFORMIN HCL ER 500 MG PO TB24: 1000.0000 mg | ORAL_TABLET | Freq: Every day | ORAL | Status: DC

## 2015-04-15 NOTE — Patient Instructions (Addendum)
TAKE METFORMIN 1 PILL TWICE A DAY. CONTINUE GLIPIZIDE 5 MG DAILY.  TAKE FLUCONAZOLE 1 PILL EVERY 3 DAYS FOR THE YEAST INFECTION.  WE WILL REFER YOU BACK TO DR. BLACKMAN, GENERAL SURGERY, FOR YOUR CYST.  FOR YOUR BACK AND AND ABDOMINAL PAIN- TAKE NAPROXEN 500 MG TWICE A DAY FOR 7 DAYS. PLACE HEATING PADS ON THE AREAS. IF YOU DEVELOP INCREASED NAUSEA, ABDOMINAL PAIN, BLOATING OR FEELING FULL QUICKLY, LET us KNOW AND WE WILL ORDER AN ULTRASOUND.   WE WILL CHECK LABS TO EVALUATE YOUR FATIGUE AND WEAKNESS.

## 2015-04-15 NOTE — Progress Notes (Signed)
Subjective:    Patient ID: Sharon Fisher, female    DOB: 03-Mar-1971, 44 y.o.   MRN: [id number]  HPI Sharon Fisher is a 44 y.o. female with PMHx of T2DM, HTN who presents to the clinic for T2DM. Please see A&P for the status of the patient's chronic medical problems.   Past Medical History  Diagnosis Date  . Diabetes mellitus   . Allergic rhinitis   . Hypertension   . Hip pain, bilateral     ? lipomas  . Lymphedema     chronic, since age 71  . Mammogram abnormal     06, s/p biopsy of mass and neg work up for neoplasm  . Right ovarian cyst     s/p resection of r ovary 06  . Polyarthropathy     inflammatory  . Irregular menstruation   . GERD (gastroesophageal reflux disease)   . Wears glasses     Outpatient Encounter Prescriptions as of 04/15/2015  Medication Sig  . ACCU-CHEK FASTCLIX LANCETS MISC 1 each by Does not apply route 2 (two) times daily. Use to check blood sugar twice a day. Diagnosis code E11.9  . enalapril (VASOTEC) 10 MG tablet Take 1 tablet (10 mg total) by mouth daily.  . fluconazole (DIFLUCAN) 150 MG tablet Take 1 tablet (150 mg total) by mouth once.  . gabapentin (NEURONTIN) 300 MG capsule Take 1 capsule (300 mg total) by mouth 2 (two) times daily.  Marland Kitchen glipiZIDE (GLUCOTROL) 5 MG tablet Take 1 tablet (5 mg total) by mouth daily before breakfast.  . metFORMIN (GLUCOPHAGE XR) 500 MG 24 hr tablet Take 2 tablets (1,000 mg total) by mouth daily with breakfast.   No facility-administered encounter medications on file as of 04/15/2015.    Family History  Problem Relation Age of Onset  . Hypertension Mother   . Cancer Mother     breast cancer  . Anemia Mother   . Sickle cell trait Sister   . Cancer Maternal Uncle     breast cancer  . Cancer Paternal Grandmother     lung cancer  . Hypertension Paternal Grandmother   . Diabetes Paternal Grandmother     Social History   Social History  . Marital Status: Married    Spouse Name: N/A  . Number of  Children: N/A  . Years of Education: N/A   Occupational History  . Not on file.   Social History Main Topics  . Smoking status: Former Research scientist (life sciences)  . Smokeless tobacco: Former Systems developer     Comment: [place].  Marland Kitchen Alcohol Use: No  . Drug Use: No  . Sexual Activity: Not on file   Other Topics Concern  . Not on file   Social History Narrative   Works as a Building control surveyor.   Lives at home with husband and daughter.   Review of Systems General: Admits to fatigue. Denies fever, chills, change in appetite and diaphoresis.  Respiratory: Denies SOB, cough, DOE.   Cardiovascular: Denies chest pain and palpitations.  Gastrointestinal: Admits to bilateral lower abdominal pain and mild constipation. Denies nausea, vomiting, diarrhea, blood in stool and abdominal distention.  Genitourinary: Admits to vaginal itching and discharge. Denies dysuria, urgency, frequency, hematuria, suprapubic pain and flank pain. Endocrine: Admits to heat intolerance. Denies cold intolerance, polyuria, and polydipsia. Musculoskeletal: Admits to low back pain. Denies joint swelling, arthralgias and gait problem.  Skin: Admits to tender cyst on posterior neck.   Neurological: Admits to weakness. Denies dizziness, headaches, lightheadedness, numbness  Objective:   Physical Exam Filed Vitals:   04/15/15 1359  BP: 134/76  Pulse: 90  Temp: 98.4 F (36.9 C)  TempSrc: Oral  Height: 5\' 5"  (1.651 m)  Weight: 282 lb (127.914 kg)  SpO2: 100%   General: Vital signs reviewed.  Patient is obese, in no acute distress and cooperative with exam.  Neck: Tender, 2 cm x 2 cm cyst on posterior neck without surrounding erythema.  Cardiovascular: RRR, S1 normal, S2 normal Pulmonary/Chest: Clear to auscultation bilaterally, no wheezes, rales, or rhonchi. Abdominal: Soft, mild tenderness in lower quadrants, non-distended, BS +, or guarding present. Obese.  Musculoskeletal: Mild tenderness on palpation of low back bilaterally.    Extremities: No lower extremity edema bilaterally, pulses symmetric and intact bilaterally. No cyanosis or clubbing. Neurological: Strength is normal and symmetric bilaterally, no focal motor deficit, sensory intact to light touch bilaterally. Ambulates without difficulty.   Skin: No rashes or erythema. Psychiatric: Normal mood and affect. speech and behavior is normal. Cognition and memory are normal.     Assessment & Plan:   Please see problem based assessment and plan.

## 2015-04-16 LAB — BMP8+ANION GAP
Anion Gap: 18 mmol/L (ref 10.0–18.0)
BUN/Creatinine Ratio: 11 (ref 9–23)
BUN: 6 mg/dL (ref 6–24)
CO2: 21 mmol/L (ref 18–29)
Calcium: 9.1 mg/dL (ref 8.7–10.2)
Chloride: 97 mmol/L (ref 97–106)
Creatinine, Ser: 0.55 mg/dL — ABNORMAL LOW (ref 0.57–1.00)
GFR calc Af Amer: 130 mL/min/1.73
GFR calc non Af Amer: 113 mL/min/1.73
Glucose: 220 mg/dL — ABNORMAL HIGH (ref 65–99)
Potassium: 4.4 mmol/L (ref 3.5–5.2)
Sodium: 136 mmol/L (ref 136–144)

## 2015-04-16 LAB — HCG, SERUM, QUALITATIVE: hCG,Beta Subunit,Qual,Serum: NEGATIVE m[IU]/mL

## 2015-04-16 LAB — TSH: TSH: 2.22 u[IU]/mL (ref 0.450–4.500)

## 2015-04-16 NOTE — Assessment & Plan Note (Signed)
Patient complains of recurrent vaginal itching with thick white discharge that never completely resolved in June after a one time dose of fluconazole. This is likely due to to uncontrolled diabetes. Patient is sexually active and monogamous with her husband. She denies any foul odor or vaginal pain. Likely resistant vaginal candidiasis.   Plan: -Fluconazole 150 mg every 72 hours for 3 doses

## 2015-04-16 NOTE — Assessment & Plan Note (Addendum)
Patient complains of bilateral low back pain with radiation of pain to her lower abdominal quadrants. Pain started one week ago and is cramping, dull and achey. It is constant. Pain was previously on her right side for a week and now has moved to her left side. At its worst, pain is a 9/10, at its best pain is a 3/10. Tylenol has not helped much. It gets better with laying down, worse with movement. She denies any associated urinary symptoms such as dysuria, urgency or frequency. She denies associated diarrhea, but does admit to constipation. She denies nausea, vomiting, pain with eating, early satiety, or bloating. LMP was in September which is not unusual for her. DDx includes musculoskeletal, constipation, less likely ovarian malignancy. I doubt UTI, PID, PUD, gastroenteritis, nephrolithiasis or appendicitis based on history and physical exam.   Plan: -Naproxen 500 mg BID WC for 7 days for low back myalgias -Continue Miralax and colace for constipation -If no improvement in pain, or worsening symptoms of pain, nausea, early satiety or bloating, obtain transvaginal ultrasound -Check pregnancy test  Addendum: -Pregnancy test negative -Talked to patient on phone on 04/19/15 she states all of her symptoms resolved after taking Naproxen for two days

## 2015-04-16 NOTE — Assessment & Plan Note (Signed)
Lab Results  Component Value Date   HGBA1C 9.1 04/15/2015   HGBA1C 9.9 11/12/2014   HGBA1C 8.7 04/15/2014     Assessment: Diabetes control:  Uncontrolled Progress toward A1C goal:   Improved Comments: Patient has been taking Metformin 500 mg daily; even though previous not indicated she should increase to 1000 mg XR daily. Patient has been compliant with glipizide 5 mg daily.   Plan: Medications:  Increase Meformin XR to 1000 mg daily. Continue Glipizide 5 mg daily.  Instruction/counseling given: discussed the need for weight loss and discussed diet Educational resources provided: brochure, handout Self management tools provided:   Other plans: Follow up in 3 months

## 2015-04-16 NOTE — Assessment & Plan Note (Addendum)
Patient complains of increasing fatigue over the last month. She states she sleeps well at night. She denies any new stressors or life changes. She currently get her menstrual cycle every 3-4 months which has been the norm for her for many years. Her last menstrual cycle was in September.   Plan: -TSH -BMET  Addendum: TSH and BMET within normal limits

## 2015-04-16 NOTE — Assessment & Plan Note (Signed)
Patient has a history of an infected sebaceous cyst on her posterior neck which was removed by general surgery in 2014 after several I&Ds in the ED with recurrence. She presents today complaining of a painful recurrent cyst on her posterior neck on the left. On exam, there is a 2 x 2 cm deep circumferential sebaceous cyst that is tender to palpation. No surrounding erythema, no fever or chills. Likely an infected sebaceous cyst.   Plan: -Referral to general surgery

## 2015-04-18 ENCOUNTER — Other Ambulatory Visit: Payer: Self-pay | Admitting: Internal Medicine

## 2015-04-18 NOTE — Telephone Encounter (Signed)
Spoke with patient, advised her that rx was sent to pharmacy on 11/11.  Confirmed with [facility] that they do have rx on file, they did not fill it because pt had just gotten a refill 10/24 for #90.  Pt understands.

## 2015-04-18 NOTE — Telephone Encounter (Signed)
Pt requesting enalapril to be filled @ [facility] on [address].

## 2015-04-18 NOTE — Progress Notes (Signed)
Medicine attending: Medical history, presenting problems, physical findings, and medications, reviewed with resident physician Dr Alexa Richardson on the day of the patient visit and I concur with her evaluation and management plan. 

## 2015-04-20 ENCOUNTER — Encounter: Payer: Self-pay | Admitting: Dietician

## 2015-04-27 ENCOUNTER — Other Ambulatory Visit: Payer: Self-pay | Admitting: Internal Medicine

## 2015-04-27 DIAGNOSIS — M545 Low back pain, unspecified: Secondary | ICD-10-CM

## 2015-04-27 NOTE — Telephone Encounter (Signed)
Pt requesting naproxen to be filled @ [facility].

## 2015-05-03 MED ORDER — NAPROXEN 500 MG PO TABS: 500.0000 mg | ORAL_TABLET | Freq: Two times a day (BID) | ORAL | Status: DC

## 2015-05-26 NOTE — Addendum Note (Signed)
Addended by: Orson Gear on: 05/26/2015 03:15 PM   Modules accepted: Orders

## 2015-05-26 NOTE — Addendum Note (Signed)
Addended by: Orson Gear on: 05/26/2015 03:14 PM   Modules accepted: Orders

## 2015-07-26 ENCOUNTER — Ambulatory Visit (INDEPENDENT_AMBULATORY_CARE_PROVIDER_SITE_OTHER): Payer: Self-pay | Admitting: Internal Medicine

## 2015-07-26 ENCOUNTER — Encounter: Payer: Self-pay | Admitting: Internal Medicine

## 2015-07-26 VITALS — BP 122/81 | HR 91 | Temp 98.5°F | Ht 65.5 in | Wt 286.3 lb

## 2015-07-26 DIAGNOSIS — I10 Essential (primary) hypertension: Secondary | ICD-10-CM

## 2015-07-26 DIAGNOSIS — B373 Candidiasis of vulva and vagina: Secondary | ICD-10-CM

## 2015-07-26 DIAGNOSIS — M25512 Pain in left shoulder: Secondary | ICD-10-CM

## 2015-07-26 DIAGNOSIS — M25519 Pain in unspecified shoulder: Secondary | ICD-10-CM

## 2015-07-26 DIAGNOSIS — B3731 Acute candidiasis of vulva and vagina: Secondary | ICD-10-CM

## 2015-07-26 DIAGNOSIS — E1121 Type 2 diabetes mellitus with diabetic nephropathy: Secondary | ICD-10-CM

## 2015-07-26 DIAGNOSIS — Z7984 Long term (current) use of oral hypoglycemic drugs: Secondary | ICD-10-CM

## 2015-07-26 DIAGNOSIS — M25511 Pain in right shoulder: Secondary | ICD-10-CM

## 2015-07-26 DIAGNOSIS — IMO0002 Reserved for concepts with insufficient information to code with codable children: Secondary | ICD-10-CM

## 2015-07-26 DIAGNOSIS — E1165 Type 2 diabetes mellitus with hyperglycemia: Secondary | ICD-10-CM

## 2015-07-26 HISTORY — DX: Pain in unspecified shoulder: M25.519

## 2015-07-26 LAB — GLUCOSE, CAPILLARY: Glucose-Capillary: 136 mg/dL — ABNORMAL HIGH (ref 65–99)

## 2015-07-26 LAB — POCT GLYCOSYLATED HEMOGLOBIN (HGB A1C): HEMOGLOBIN A1C: 9.3

## 2015-07-26 MED ORDER — FLUCONAZOLE 150 MG PO TABS: 150.0000 mg | ORAL_TABLET | ORAL | Status: DC

## 2015-07-26 MED ORDER — GLIPIZIDE 5 MG PO TABS: 5.0000 mg | ORAL_TABLET | Freq: Two times a day (BID) | ORAL | Status: DC

## 2015-07-26 NOTE — Progress Notes (Signed)
   Subjective:   Patient ID: Sharon Fisher female   DOB: 12-05-1970 45 y.o.   MRN: [id number]  HPI: [name] is a 46 y.o. female w/ PMHx of DM type II, HTN, GERD, presents to the clinic today for a follow-up visit regarding her BP and DM type II. Patient states that her CBG's have been elevated at home, in the 200's most of the time. Continues to use Metformin 1000 mg daily + Glipizide 5 mg qAM. She also still has symptoms of yeast infection, most significantly itching, does not describe dysuria or discharge. Also has bilateral shoulder pain which has been an issue for her in the past.   Current Outpatient Prescriptions  Medication Sig Dispense Refill  . ACCU-CHEK FASTCLIX LANCETS MISC 1 each by Does not apply route 2 (two) times daily. Use to check blood sugar twice a day. Diagnosis code E11.9 100 each 5  . enalapril (VASOTEC) 10 MG tablet Take 1 tablet (10 mg total) by mouth daily. 90 tablet 3  . fluconazole (DIFLUCAN) 150 MG tablet Take 1 tablet (150 mg total) by mouth every 3 (three) days. 3 tablet 0  . gabapentin (NEURONTIN) 300 MG capsule Take 1 capsule (300 mg total) by mouth 2 (two) times daily. 60 capsule 3  . glipiZIDE (GLUCOTROL) 5 MG tablet Take 1 tablet (5 mg total) by mouth daily before breakfast. 90 tablet 3  . metFORMIN (GLUCOPHAGE XR) 500 MG 24 hr tablet Take 2 tablets (1,000 mg total) by mouth daily with breakfast. 180 tablet 3  . naproxen (NAPROSYN) 500 MG tablet Take 1 tablet (500 mg total) by mouth 2 (two) times daily with a meal. 14 tablet 0   No current facility-administered medications for this visit.    Review of Systems  General: Denies fever, diaphoresis, appetite change, and fatigue.  Respiratory: Denies SOB, cough, and wheezing.   Cardiovascular: Denies chest pain and palpitations.  Gastrointestinal: Denies nausea, vomiting, abdominal pain, and diarrhea Musculoskeletal: Positive for myalgias. Denies arthralgias, back pain, and gait problem.    Neurological: Denies dizziness, syncope, weakness, lightheadedness, and headaches.  Psychiatric/Behavioral: Denies mood changes, sleep disturbance, and agitation.   Objective:   Physical Exam: Filed Vitals:   07/26/15 1544  BP: 122/81  Pulse: 91  Temp: 98.5 F (36.9 C)  TempSrc: Oral  Height: 5' 5.5" (1.664 m)  Weight: 286 lb 4.8 oz (129.865 kg)  SpO2: 100%   Physical Exam: General: Obese female, alert, cooperative, NAD. HEENT: PERRL, EOMI. Moist mucus membranes Neck: Full range of motion without pain, supple, no lymphadenopathy or carotid bruits Lungs: Clear to ascultation bilaterally, normal work of respiration, no wheezes, rales, rhonchi Heart: RRR, no murmurs, gallops, or rubs Abdomen: Soft, non-tender, non-distended, BS + Extremities: No cyanosis, clubbing, or edema Neurologic: Alert & oriented X3, cranial nerves II-XII intact, strength grossly intact, sensation intact to light touch   Assessment & Plan:   Please see problem based assessment and plan.

## 2015-07-26 NOTE — Patient Instructions (Signed)
1. Please return in 2 weeks.   2. Please take all medications as previously prescribed with the following changes:  Increase glipizide to take 5 mg twice daily with meals.   Continue taking your Metformin 1000 mg daily. Take both pills at the same time.   At your next visit you will also meet with the diabetes educator to discuss your diet.   3. If you have worsening of your symptoms or new symptoms arise, please call the clinic [phone]), or go to the ER immediately if symptoms are severe.  You have done a great job in taking all your medications. Please continue to do this.

## 2015-07-27 LAB — SEDIMENTATION RATE: Sed Rate: 23 mm/hr (ref 0–32)

## 2015-07-27 LAB — BMP8+ANION GAP
Anion Gap: 18 mmol/L (ref 10.0–18.0)
BUN/Creatinine Ratio: 11 (ref 9–23)
BUN: 6 mg/dL (ref 6–24)
CALCIUM: 8.5 mg/dL — AB (ref 8.7–10.2)
CHLORIDE: 99 mmol/L (ref 96–106)
CO2: 22 mmol/L (ref 18–29)
Creatinine, Ser: 0.57 mg/dL (ref 0.57–1.00)
GFR calc Af Amer: 129 mL/min/{1.73_m2} (ref >59)
GFR calc non Af Amer: 112 mL/min/{1.73_m2} (ref >59)
GLUCOSE: 139 mg/dL — AB (ref 65–99)
Potassium: 3.9 mmol/L (ref 3.5–5.2)
SODIUM: 139 mmol/L (ref 134–144)

## 2015-07-27 LAB — C-REACTIVE PROTEIN: CRP: 25 mg/L — ABNORMAL HIGH (ref 0.0–4.9)

## 2015-07-27 NOTE — Assessment & Plan Note (Signed)
Lab Results  Component Value Date   HGBA1C 9.3 07/26/2015   HGBA1C 9.1 04/15/2015   HGBA1C 9.9 11/12/2014     Assessment: Diabetes control:  Uncontrolled Comments: Has been taking Glipizide 5 mg qAM + Metformin XR 1000 mg daily. Still with HbA1c of 9.3.   Plan: Medications:  Increase Glipizide to 5 mg bid. Continue Metformin. Could potentially increase Metformin dose as well.  Instruction/counseling given: reminded to bring blood glucose meter & log to each visit, reminded to bring medications to each visit, discussed foot care, discussed the need for weight loss and discussed diet Other plans: Referral to DM education. BMP good. RTC in 2 weeks. Will likely need basal insulin in the VERY near future.

## 2015-07-27 NOTE — Assessment & Plan Note (Signed)
Very non-specific pain, is unable to describe the quality. Feels "painful but weird". Has had this for some time. No weakness. Suspect this is mostly related to MSK pain, needs to lose weight. Some question of Polymyalgia given her description and location of pain, however, she does not fit appropriate age group, ESR normal, CRP elevated. Discordant results unclear, however, 15% of AA females have CRP levels normally above 20.  -Advised OTC NSAIDS for now -Can consider further workup if this becomes more severe and painful.  -RTC in 2 weeks.

## 2015-07-27 NOTE — Assessment & Plan Note (Signed)
Continues to have vaginal pruritus. Suspect this is mostly related to elevated blood sugar. No dysuria or discharge.  -Diflucan 150 mg q3days for 2 doses.  -Needs improved CBG control -RTC in 2 weeks

## 2015-07-27 NOTE — Assessment & Plan Note (Signed)
BP Readings from Last 3 Encounters:  07/26/15 122/81  04/15/15 134/76  11/12/14 156/85    Lab Results  Component Value Date   NA 139 07/26/2015   K 3.9 07/26/2015   CREATININE 0.57 07/26/2015    Assessment: Blood pressure control:  Controlled Progress toward BP goal:   At goal Comments: Taking Enalapril 10 mg daily  Plan: Medications:  continue current medications Other plans: Checked renal function, Cr at baseline. RTC in 2 weeks for follow up regarding her DM.

## 2015-07-28 NOTE — Progress Notes (Signed)
Internal Medicine Clinic Attending  Case discussed with Dr. Jones at the time of the visit.  We reviewed the resident's history and exam and pertinent patient test results.  I agree with the assessment, diagnosis, and plan of care documented in the resident's note.  

## 2015-08-06 ENCOUNTER — Other Ambulatory Visit: Payer: Self-pay | Admitting: Pulmonary Disease

## 2015-08-07 ENCOUNTER — Emergency Department (HOSPITAL_COMMUNITY): Payer: Self-pay

## 2015-08-07 ENCOUNTER — Encounter (HOSPITAL_COMMUNITY): Payer: Self-pay | Admitting: *Deleted

## 2015-08-07 ENCOUNTER — Emergency Department (HOSPITAL_COMMUNITY)
Admission: EM | Admit: 2015-08-07 | Discharge: 2015-08-07 | Disposition: A | Discharge status: Home / Self Care | Payer: Self-pay | Attending: Emergency Medicine | Admitting: Emergency Medicine

## 2015-08-07 DIAGNOSIS — Z8742 Personal history of other diseases of the female genital tract: Secondary | ICD-10-CM | POA: Insufficient documentation

## 2015-08-07 DIAGNOSIS — R739 Hyperglycemia, unspecified: Secondary | ICD-10-CM

## 2015-08-07 DIAGNOSIS — Z87891 Personal history of nicotine dependence: Secondary | ICD-10-CM | POA: Insufficient documentation

## 2015-08-07 DIAGNOSIS — R079 Chest pain, unspecified: Secondary | ICD-10-CM | POA: Insufficient documentation

## 2015-08-07 DIAGNOSIS — E669 Obesity, unspecified: Secondary | ICD-10-CM | POA: Insufficient documentation

## 2015-08-07 DIAGNOSIS — I10 Essential (primary) hypertension: Secondary | ICD-10-CM | POA: Insufficient documentation

## 2015-08-07 DIAGNOSIS — R002 Palpitations: Secondary | ICD-10-CM | POA: Insufficient documentation

## 2015-08-07 DIAGNOSIS — R42 Dizziness and giddiness: Secondary | ICD-10-CM | POA: Insufficient documentation

## 2015-08-07 DIAGNOSIS — Z7984 Long term (current) use of oral hypoglycemic drugs: Secondary | ICD-10-CM | POA: Insufficient documentation

## 2015-08-07 DIAGNOSIS — E1165 Type 2 diabetes mellitus with hyperglycemia: Secondary | ICD-10-CM | POA: Insufficient documentation

## 2015-08-07 DIAGNOSIS — R5383 Other fatigue: Secondary | ICD-10-CM | POA: Insufficient documentation

## 2015-08-07 DIAGNOSIS — R1110 Vomiting, unspecified: Secondary | ICD-10-CM | POA: Insufficient documentation

## 2015-08-07 DIAGNOSIS — K219 Gastro-esophageal reflux disease without esophagitis: Secondary | ICD-10-CM | POA: Insufficient documentation

## 2015-08-07 HISTORY — DX: Obesity, unspecified: E66.9

## 2015-08-07 LAB — BASIC METABOLIC PANEL
Anion gap: 13 (ref 5–15)
BUN: 7 mg/dL (ref 6–20)
CALCIUM: 8.5 mg/dL — AB (ref 8.9–10.3)
CO2: 22 mmol/L (ref 22–32)
CREATININE: 0.74 mg/dL (ref 0.44–1.00)
Chloride: 103 mmol/L (ref 101–111)
GFR calc non Af Amer: >60 mL/min (ref >60)
Glucose, Bld: 358 mg/dL — ABNORMAL HIGH (ref 65–99)
Potassium: 3.8 mmol/L (ref 3.5–5.1)
Sodium: 138 mmol/L (ref 135–145)

## 2015-08-07 LAB — URINALYSIS, ROUTINE W REFLEX MICROSCOPIC
Bilirubin Urine: NEGATIVE
Ketones, ur: NEGATIVE mg/dL
LEUKOCYTES UA: NEGATIVE
Nitrite: NEGATIVE
PH: 5.5 (ref 5.0–8.0)
Protein, ur: NEGATIVE mg/dL
SPECIFIC GRAVITY, URINE: 1.045 — AB (ref 1.005–1.030)

## 2015-08-07 LAB — CBC
HCT: 36.9 % (ref 36.0–46.0)
Hemoglobin: 12.2 g/dL (ref 12.0–15.0)
MCH: 29.5 pg (ref 26.0–34.0)
MCHC: 33.1 g/dL (ref 30.0–36.0)
MCV: 89.1 fL (ref 78.0–100.0)
PLATELETS: 262 10*3/uL (ref 150–400)
RBC: 4.14 MIL/uL (ref 3.87–5.11)
RDW: 15 % (ref 11.5–15.5)
WBC: 6.6 10*3/uL (ref 4.0–10.5)

## 2015-08-07 LAB — CBG MONITORING, ED: Glucose-Capillary: 309 mg/dL — ABNORMAL HIGH (ref 65–99)

## 2015-08-07 LAB — I-STAT TROPONIN, ED: TROPONIN I, POC: 0 ng/mL (ref 0.00–0.08)

## 2015-08-07 LAB — URINE MICROSCOPIC-ADD ON: BACTERIA UA: NONE SEEN

## 2015-08-07 LAB — LIPASE, BLOOD: LIPASE: 48 U/L (ref 11–51)

## 2015-08-07 MED ORDER — SODIUM CHLORIDE 0.9 % IV BOLUS (SEPSIS)
2000.0000 mL | Freq: Once | INTRAVENOUS | Status: AC
  Administered 2015-08-07: 2000 mL via INTRAVENOUS

## 2015-08-07 NOTE — ED Notes (Signed)
Pt reports not feeling well since Friday evening. Has upper abd discomfort into her chest. Also reports palpitations and episodes of n/v, chills, bodyaches, headache.

## 2015-08-07 NOTE — ED Notes (Signed)
Pt ambulatory in hallway with MD.

## 2015-08-07 NOTE — ED Notes (Signed)
Pt ambulates independently and with steady gait at time of discharge. Discharge instructions and follow up information reviewed with patient. No other questions or concerns voiced at this time.  

## 2015-08-07 NOTE — ED Notes (Signed)
Lab aware of add on lipase.

## 2015-08-07 NOTE — ED Notes (Signed)
MD at bedside. 

## 2015-08-07 NOTE — ED Provider Notes (Signed)
CSN: [id number]     Arrival date & time 08/07/15  1259 History   First MD Initiated Contact with Patient 08/07/15 1612     Chief Complaint  Patient presents with  . Weakness  . Chest Pain     (Consider location/radiation/quality/duration/timing/severity/associated sxs/prior Treatment) HPI  45 year old female presents with a chief complaint of dizziness. Is constant, worse with standing. Feels like she's lightheaded and going to pass out (but hasn't). Started 2 days ago after eating out. Next day had a couple episodes of loose stools and vomiting x 1. Nausea has resolved. Had chills and aches the other day but also gone. Has a constant sensation of her heart "fluttering". Specifically denies pain. No dyspnea. No headache, ear ringing/ear pain. Is able to walk but increases the dizziness. Had abdominal pain on day one but that is resolved. Was all over. Feels overall fatigued. Her glucose has been "good" but she doesn't check it at home and only get its checked at PCP's. Has an appointment with PCP in 2 days.   Past Medical History  Diagnosis Date  . Diabetes mellitus   . Allergic rhinitis   . Hypertension   . Hip pain, bilateral     ? lipomas  . Lymphedema     chronic, since age 58  . Mammogram abnormal     06, s/p biopsy of mass and neg work up for neoplasm  . Right ovarian cyst     s/p resection of r ovary 06  . Polyarthropathy     inflammatory  . Irregular menstruation   . GERD (gastroesophageal reflux disease)   . Wears glasses   . Obesity    Past Surgical History  Procedure Laterality Date  . Tonsillectomy    . Surgery of fibroids      but still has fibroids  . Dilation and curettage of uterus    . Breast reduction surgery  1996  . Mass excision N/A 02/27/2013    Procedure: CYST REMOVAL NECK;  Surgeon: Harl Bowie, MD;  Location: [facility];  Service: General;  Laterality: N/A;  Excision chronic posterior neck cyst   Family History  Problem  Relation Age of Onset  . Hypertension Mother   . Cancer Mother     breast cancer  . Anemia Mother   . Sickle cell trait Sister   . Cancer Maternal Uncle     breast cancer  . Cancer Paternal Grandmother     lung cancer  . Hypertension Paternal Grandmother   . Diabetes Paternal Grandmother    Social History  Substance Use Topics  . Smoking status: Former Research scientist (life sciences)  . Smokeless tobacco: Former Systems developer     Comment: [place].  Marland Kitchen Alcohol Use: No   OB History    No data available     Review of Systems  Constitutional: Positive for chills and fatigue. Negative for fever.  Respiratory: Negative for shortness of breath.   Cardiovascular: Positive for palpitations. Negative for chest pain.  Gastrointestinal: Positive for vomiting, abdominal pain and diarrhea.  Genitourinary: Negative for dysuria.  Neurological: Positive for dizziness and light-headedness. Negative for syncope and headaches.  All other systems reviewed and are negative.     Allergies  Shellfish allergy  Home Medications   Prior to Admission medications   Medication Sig Start Date End Date Taking? Authorizing Provider  acetaminophen (TYLENOL) 500 MG tablet Take 1,000 mg by mouth every 6 (six) hours as needed (pain).   Yes Historical  Provider, MD  enalapril (VASOTEC) 10 MG tablet Take 1 tablet (10 mg total) by mouth daily. Patient taking differently: Take 10 mg by mouth daily after lunch.  04/15/15  Yes Alexa Angela Burke, MD  gabapentin (NEURONTIN) 300 MG capsule Take 1 capsule (300 mg total) by mouth 2 (two) times daily. Patient taking differently: Take 300 mg by mouth at bedtime as needed (neuropathy in feet).  04/15/15  Yes Alexa Angela Burke, MD  glipiZIDE (GLUCOTROL) 5 MG tablet Take 1 tablet (5 mg total) by mouth 2 (two) times daily before a meal. 07/26/15 07/24/16 Yes Corky Sox, MD  metFORMIN (GLUCOPHAGE XR) 500 MG 24 hr tablet Take 2 tablets (1,000 mg total) by mouth daily with breakfast. Patient taking differently:  Take 500 mg by mouth 2 (two) times daily.  04/15/15  Yes Alexa Angela Burke, MD  ACCU-CHEK FASTCLIX LANCETS MISC 1 each by Does not apply route 2 (two) times daily. Use to check blood sugar twice a day. Diagnosis code E11.9 11/12/14   Milagros Loll, MD  naproxen (NAPROSYN) 500 MG tablet Take 1 tablet (500 mg total) by mouth 2 (two) times daily with a meal. Patient not taking: Reported on 08/07/2015 05/03/15   Corky Sox, MD   BP 146/88 mmHg  Pulse 71  Temp(Src) 97.9 F (36.6 C) (Oral)  Resp 23  SpO2 98%  LMP 04/19/2015 Physical Exam  Constitutional: She is oriented to person, place, and time. She appears well-developed and well-nourished. No distress.  obese  HENT:  Head: Normocephalic and atraumatic.  Right Ear: External ear normal.  Left Ear: External ear normal.  Nose: Nose normal.  Eyes: EOM are normal. Pupils are equal, round, and reactive to light. Right eye exhibits no discharge. Left eye exhibits no discharge. Right eye exhibits no nystagmus. Left eye exhibits no nystagmus.  Neck: Neck supple.  Cardiovascular: Normal rate, regular rhythm and normal heart sounds.   Pulmonary/Chest: Effort normal and breath sounds normal.  Abdominal: Soft. She exhibits no distension. There is no tenderness.  Neurological: She is alert and oriented to person, place, and time.  CN 2-12 grossly intact. 5/5 strength in all 4 extremities. Grossly normal sensation. Normal finger to nose. Normal gait  Skin: Skin is warm and dry. She is not diaphoretic.  Nursing note and vitals reviewed.   ED Course  Procedures (including critical care time) Labs Review Labs Reviewed  BASIC METABOLIC PANEL - Abnormal; Notable for the following:    Glucose, Bld 358 (*)    Calcium 8.5 (*)    All other components within normal limits  URINALYSIS, ROUTINE W REFLEX MICROSCOPIC (NOT AT [facility]) - Abnormal; Notable for the following:    Specific Gravity, Urine 1.045 (*)    Glucose, UA >1000 (*)    Hgb urine dipstick SMALL  (*)    All other components within normal limits  URINE MICROSCOPIC-ADD ON - Abnormal; Notable for the following:    Squamous Epithelial / LPF 0-5 (*)    All other components within normal limits  CBG MONITORING, ED - Abnormal; Notable for the following:    Glucose-Capillary 309 (*)    All other components within normal limits  CBC  LIPASE, BLOOD  I-STAT TROPOININ, ED    Imaging Review Dg Chest 2 View  08/07/2015  CLINICAL DATA:  Pt c/o chills, feeling hot, vomiting, diarrhea, heart palpitations, weakness, lethargy and dizziness x Friday. Hx HTN controlled with medication, ex-smoker as of 1995 and diabetes. EXAM: CHEST - 2 VIEW  COMPARISON:  04/29/2019 low FINDINGS: Low lung volumes with crowding of bronchovascular structures. No overt edema. No confluent airspace disease. Heart size normal. No effusion. No pneumothorax. Visualized skeletal structures are unremarkable. IMPRESSION: No acute cardiopulmonary disease. Electronically Signed   By: Lucrezia Europe M.D.   On: 08/07/2015 13:58   I have personally reviewed and evaluated these images and lab results as part of my medical decision-making.   EKG Interpretation   Date/Time:  Sunday August 07 2015 13:07:41 EST Ventricular Rate:  100 PR Interval:  188 QRS Duration: 74 QT Interval:  360 QTC Calculation: 464 R Axis:   -25 Text Interpretation:  Normal sinus rhythm Normal ECG no significant change  since 2014 Confirmed by Natanya Holecek  MD, Linnea Todisco ([id number]) on 08/07/2015 4:09:51 PM      MDM   Final diagnoses:  Hyperglycemia  Lightheadedness    Patient's dizziness is likely related to dehydration associated with hyperglycemia. No evidence of acute kidney injury or acidosis. Feels much better after IV fluids. Despite initial triage note there is no actual chest pain. Palpitations have improved with fluids. There is no sign of arrhythmia. At this point I feel she has been screened for emergent conditions and can follow-up with her PCP in the next  couple days. Neuro exam is unremarkable, I do not think this is vertigo or stroke. Discussed return precautions.    Sherwood Gambler, MD 08/08/15 [phone]

## 2015-08-09 ENCOUNTER — Ambulatory Visit (INDEPENDENT_AMBULATORY_CARE_PROVIDER_SITE_OTHER): Payer: Self-pay | Admitting: Dietician

## 2015-08-09 ENCOUNTER — Encounter: Payer: Self-pay | Admitting: Dietician

## 2015-08-09 ENCOUNTER — Ambulatory Visit (INDEPENDENT_AMBULATORY_CARE_PROVIDER_SITE_OTHER): Payer: Self-pay | Admitting: Internal Medicine

## 2015-08-09 ENCOUNTER — Encounter: Payer: Self-pay | Admitting: Internal Medicine

## 2015-08-09 VITALS — BP 123/73 | HR 89 | Temp 98.0°F | Ht 65.0 in | Wt 285.0 lb

## 2015-08-09 DIAGNOSIS — Z8742 Personal history of other diseases of the female genital tract: Secondary | ICD-10-CM

## 2015-08-09 DIAGNOSIS — M545 Low back pain, unspecified: Secondary | ICD-10-CM

## 2015-08-09 DIAGNOSIS — Z79899 Other long term (current) drug therapy: Secondary | ICD-10-CM

## 2015-08-09 DIAGNOSIS — IMO0001 Reserved for inherently not codable concepts without codable children: Secondary | ICD-10-CM

## 2015-08-09 DIAGNOSIS — I10 Essential (primary) hypertension: Secondary | ICD-10-CM

## 2015-08-09 DIAGNOSIS — Z0000 Encounter for general adult medical examination without abnormal findings: Secondary | ICD-10-CM | POA: Insufficient documentation

## 2015-08-09 DIAGNOSIS — E1165 Type 2 diabetes mellitus with hyperglycemia: Secondary | ICD-10-CM

## 2015-08-09 DIAGNOSIS — E785 Hyperlipidemia, unspecified: Secondary | ICD-10-CM

## 2015-08-09 DIAGNOSIS — IMO0002 Reserved for concepts with insufficient information to code with codable children: Secondary | ICD-10-CM

## 2015-08-09 DIAGNOSIS — B3731 Acute candidiasis of vulva and vagina: Secondary | ICD-10-CM

## 2015-08-09 DIAGNOSIS — B373 Candidiasis of vulva and vagina: Secondary | ICD-10-CM

## 2015-08-09 DIAGNOSIS — Z713 Dietary counseling and surveillance: Secondary | ICD-10-CM

## 2015-08-09 DIAGNOSIS — Z7984 Long term (current) use of oral hypoglycemic drugs: Secondary | ICD-10-CM

## 2015-08-09 DIAGNOSIS — Z794 Long term (current) use of insulin: Secondary | ICD-10-CM

## 2015-08-09 DIAGNOSIS — E1121 Type 2 diabetes mellitus with diabetic nephropathy: Secondary | ICD-10-CM

## 2015-08-09 LAB — GLUCOSE, CAPILLARY: GLUCOSE-CAPILLARY: 175 mg/dL — AB (ref 65–99)

## 2015-08-09 MED ORDER — ENALAPRIL MALEATE 10 MG PO TABS: 10.0000 mg | ORAL_TABLET | Freq: Every day | ORAL | Status: DC

## 2015-08-09 MED ORDER — NAPROXEN 500 MG PO TABS: 500.0000 mg | ORAL_TABLET | Freq: Two times a day (BID) | ORAL | Status: DC

## 2015-08-09 MED ORDER — METFORMIN HCL ER (OSM) 1000 MG PO TB24: 2000.0000 mg | ORAL_TABLET | Freq: Every day | ORAL | Status: DC

## 2015-08-09 NOTE — Progress Notes (Signed)
  Medical Nutrition Therapy:  Appt start time: 1330 end time:  [id number]. Visit # 1- patient and spouse known from prior visits  Assessment:  Primary concerns today: blood sugar control Patient and spouse here to assist her in lowering her blood sugars. Patient andspouse both have diabetes. Her spouse is supportive, however, they both like to eat fast food- mostly highly processed, high  fat and high sugar foods. She ate 3 meals a day until the past mont when she decided to try skipping lunch and has felt poorly since doing that. She seemed relieved to find out that eating 3 meals a day is a good things and seemed wiling to resume doing that even if she has to use a meal replacement. We discussed the need for weight loss and change to her current lifestyle to achieve her A1C without added medication.   Preferred Learning Style:No preference indicated  Learning Readiness:Contemplating  ANTHROPOMETRICS: weight-285# , height-64", BMI-  WEIGHT HISTORY:pretty stable for the past 9 years SLEEP:need to assess at future visit MEDICATIONS: reviewed and pts says she is taking BLOOD SUGAR: 180 today after drinking orange juice, has [facility] [employer] prime meter- helped them set up and gave her a lancing device DIETARY INTAKE: Usual eating pattern includes 3 meals  per day. Everyday foods include hamburgers, fried, high fat and high sugar drinks.  Avoided foods include lactose.   24-hr recall:  B ( AM): fast food or sheets- egg, bacon and cheese croissant, regular soda  L ( PM): sandwich chips and regular soda D ( PM): fast food, chicken nuggets x10, large fry and sweet tea Beverages: soda, sweet tea  Usual physical activity: need to assess at future visit  Estimated energy needs: 1600-1800 calories ~180 g carbohydrates 75-85 g protein ~50 g fat  Progress Towards Goal(s):  In progress.   Nutritional Diagnosis:  NI-5.8.2 Excessive carbohydrate intake As related to sugar sweetened beverage choices.  As  evidenced by her food recall and report.    Intervention:  Nutrition education about healthy meal planning for diabetes and counseling about lifestyle change, smart goals. Coordination of care: showed her how to use insulin pen, she and spouse know how to use a vial and syringe  Teaching Method Utilized: Visual, Auditory and  Hands on Handouts given during visit include:healhty meal pl;anning for diabetes and AVS Barriers to learning/adherence to lifestyle change: family eats fast food, competing values Demonstrated degree of understanding via:  Teach Back   Monitoring/Evaluation:  Dietary intake, exercise, meter, and body weight in 3 week(s).

## 2015-08-09 NOTE — Patient Instructions (Signed)
1. Please make a follow up for 4 weeks.   2. Please take all medications as previously prescribed with the following changes:  Continue Glipizide 5 mg twice daily  Increase Metformin to 2000 mg once daily. I have sent a new prescription to the [facility] on [address].   Eat healthy and exercise.   We will start a cholesterol medicine at your next appointment.   Apply for the Medication Assistance Program at the [facility] Department.  3. If you have worsening of your symptoms or new symptoms arise, please call the clinic [phone]), or go to the ER immediately if symptoms are severe.  You have done a great job in taking all your medications. Please continue to do this.

## 2015-08-09 NOTE — Patient Instructions (Signed)
Your A1C goal for your self is <7%, your current A1C is 9.3 on February 21st, 2017  This means trying to get your blood sugars from an average of 220 to around 150, mostly between 100 and 200.    You will be getting anew A1C around Oct 23, 2015.   To help lower your A1C toward your goal, you said you are going to eat a smaller portion of fries and drink diet drinks for dinner at [facility] for the next two weeks.   I recommend a follow up to track your progress in 3 weeks or you can give me a call and we can discuss on the phone.   You are taking care of your body by eating three times a day- I recommend you keep doing that!   [name]  [phone]

## 2015-08-09 NOTE — Progress Notes (Signed)
   Subjective:   Patient ID: Sharon Fisher female   DOB: Oct 27, 1970 45 y.o.   MRN: [id number]  HPI: Sharon Fisher is a 45 y.o. female w/ PMHx of DM type II, HTN, GERD, presents to the clinic today for a follow-up visit regarding her BP and DM type II. Trying to eat healthier, using her medications as instructed at her previous visit. Says her shoulder pain has improved. Had a recent ED visit for lightheadedness, thought to be 2/2 dehydration most likely related to recent viral illness. Very reluctant to start insulin.    Current Outpatient Prescriptions  Medication Sig Dispense Refill  . ACCU-CHEK FASTCLIX LANCETS MISC 1 each by Does not apply route 2 (two) times daily. Use to check blood sugar twice a day. Diagnosis code E11.9 100 each 5  . acetaminophen (TYLENOL) 500 MG tablet Take 1,000 mg by mouth every 6 (six) hours as needed (pain).    . enalapril (VASOTEC) 10 MG tablet Take 1 tablet (10 mg total) by mouth daily. (Patient taking differently: Take 10 mg by mouth daily after lunch. ) 90 tablet 3  . gabapentin (NEURONTIN) 300 MG capsule Take 1 capsule (300 mg total) by mouth 2 (two) times daily. (Patient taking differently: Take 300 mg by mouth at bedtime as needed (neuropathy in feet). ) 60 capsule 3  . glipiZIDE (GLUCOTROL) 5 MG tablet Take 1 tablet (5 mg total) by mouth 2 (two) times daily before a meal. 180 tablet 1  . metFORMIN (GLUCOPHAGE XR) 500 MG 24 hr tablet Take 2 tablets (1,000 mg total) by mouth daily with breakfast. (Patient taking differently: Take 500 mg by mouth 2 (two) times daily. ) 180 tablet 3  . naproxen (NAPROSYN) 500 MG tablet Take 1 tablet (500 mg total) by mouth 2 (two) times daily with a meal. (Patient not taking: Reported on 08/07/2015) 14 tablet 0   No current facility-administered medications for this visit.    Review of Systems  General: Denies fever, diaphoresis, appetite change, and fatigue.  Respiratory: Denies SOB, cough, and wheezing.     Cardiovascular: Denies chest pain and palpitations.  Gastrointestinal: Denies nausea, vomiting, abdominal pain, and diarrhea Musculoskeletal: Denies myalgias, arthralgias, back pain, and gait problem.  Neurological: Denies dizziness, syncope, weakness, lightheadedness, and headaches.  Psychiatric/Behavioral: Denies mood changes, sleep disturbance, and agitation.    Objective:   Physical Exam: Filed Vitals:   08/09/15 1433  BP: 123/73  Pulse: 89  Temp: 98 F (36.7 C)  TempSrc: Oral  Weight: 285 lb (129.275 kg)  SpO2: 100%    General: Obese female, alert, cooperative, NAD. HEENT: PERRL, EOMI. Moist mucus membranes Neck: Full range of motion without pain, supple, no lymphadenopathy or carotid bruits Lungs: Clear to ascultation bilaterally, normal work of respiration, no wheezes, rales, rhonchi Heart: RRR, no murmurs, gallops, or rubs Abdomen: Soft, non-tender, non-distended, BS + Extremities: No cyanosis, clubbing, or edema Neurologic: Alert & oriented X3, cranial nerves II-XII intact, strength grossly intact, sensation intact to light touch   Assessment & Plan:   Please see problem based assessment and plan.

## 2015-08-10 LAB — LIPID PANEL
Chol/HDL Ratio: 4.5 ratio units — ABNORMAL HIGH (ref 0.0–4.4)
Cholesterol, Total: 149 mg/dL (ref 100–199)
HDL: 33 mg/dL — ABNORMAL LOW (ref >39)
LDL Calculated: 92 mg/dL (ref 0–99)
Triglycerides: 118 mg/dL (ref 0–149)
VLDL Cholesterol Cal: 24 mg/dL (ref 5–40)

## 2015-08-10 NOTE — Assessment & Plan Note (Signed)
Refilled Naproxen for intermittent low back pain.

## 2015-08-10 NOTE — Assessment & Plan Note (Signed)
Foot exam today. Declined flu

## 2015-08-10 NOTE — Assessment & Plan Note (Signed)
Checked lipid panel today. Given ASCVD risk, discussed starting moderate/high intensity statin. Does not want to start this today mostly based on financial reasons. Discussed MAP program for medications, will set this up and can hopefully start Lipitor at next visit.

## 2015-08-10 NOTE — Assessment & Plan Note (Signed)
BP well controlled today. Continue Enalapril 10 mg daily.

## 2015-08-10 NOTE — Assessment & Plan Note (Signed)
Resolved

## 2015-08-10 NOTE — Assessment & Plan Note (Signed)
Lab Results  Component Value Date   HGBA1C 9.3 07/26/2015   HGBA1C 9.1 04/15/2015   HGBA1C 9.9 11/12/2014     Assessment: Diabetes control:  Uncontrolled Comments: Increased her Glipizide to 5 mg bid at her last visit. Instead has been taking 10 mg qAM. Does not want to start insulin at this time, although we did discuss the likely need for basal insulin in the near future.   Plan: Medications:  Increase Metformin to 2000 mg daily. Continue Glipizide 5 mg bid. Explained she would probably tolerate bid dosing better than 10 mg qAM.  Instruction/counseling given: reminded to bring blood glucose meter & log to each visit, reminded to bring medications to each visit, discussed foot care, discussed the need for weight loss and discussed diet Other plans: RTC in 4 weeks. Met with diabetes educator today. Discussed important factors involving nutrition. Also educated on insulin use given likely need for this in the future.

## 2015-08-12 NOTE — Progress Notes (Signed)
Internal Medicine Clinic Attending  Case discussed with Dr. Jones at the time of the visit.  We reviewed the resident's history and exam and pertinent patient test results.  I agree with the assessment, diagnosis, and plan of care documented in the resident's note.  

## 2015-09-05 ENCOUNTER — Other Ambulatory Visit: Payer: Self-pay

## 2015-09-05 DIAGNOSIS — N644 Mastodynia: Secondary | ICD-10-CM

## 2015-09-06 ENCOUNTER — Telehealth: Payer: Self-pay | Admitting: Internal Medicine

## 2015-09-06 NOTE — Telephone Encounter (Signed)
APPT. REMINDER CALL, LMTCB °

## 2015-09-07 ENCOUNTER — Encounter: Payer: Self-pay | Admitting: Internal Medicine

## 2015-09-07 ENCOUNTER — Ambulatory Visit (INDEPENDENT_AMBULATORY_CARE_PROVIDER_SITE_OTHER): Payer: Self-pay | Admitting: Internal Medicine

## 2015-09-07 VITALS — BP 122/68 | HR 94 | Temp 98.1°F | Ht 65.0 in | Wt 285.5 lb

## 2015-09-07 DIAGNOSIS — N63 Unspecified lump in breast: Secondary | ICD-10-CM

## 2015-09-07 DIAGNOSIS — N6320 Unspecified lump in the left breast, unspecified quadrant: Secondary | ICD-10-CM

## 2015-09-07 NOTE — Patient Instructions (Signed)
Thank you for your visit today.   Please return to the internal medicine clinic to see your primary doctor as needed.     We will refer you for a mammogram today.   Please be sure to bring all of your medications with you to every visit; this includes herbal supplements, vitamins, eye drops, and any over-the-counter medications.   Should you have any questions regarding your medications and/or any new or worsening symptoms, please be sure to call the clinic at [phone].   If you believe that you are suffering from a life threatening condition or one that may result in the loss of limb or function, then you should call 911 and proceed to the nearest Emergency Department.   A healthy lifestyle and preventative care can promote health and wellness.   Maintain regular health, dental, and eye exams.  Eat a healthy diet. Foods like vegetables, fruits, whole grains, low-fat dairy products, and lean protein foods contain the nutrients you need without too many calories. Decrease your intake of foods high in solid fats, added sugars, and salt. Get information about a proper diet from your caregiver, if necessary.  Regular physical exercise is one of the most important things you can do for your health. Most adults should get at least 150 minutes of moderate-intensity exercise (any activity that increases your heart rate and causes you to sweat) each week. In addition, most adults need muscle-strengthening exercises on 2 or more days a week.   Maintain a healthy weight. The body mass index (BMI) is a screening tool to identify possible weight problems. It provides an estimate of body fat based on height and weight. Your caregiver can help determine your BMI, and can help you achieve or maintain a healthy weight. For adults 20 years and older:  A BMI below 18.5 is considered underweight.  A BMI of 18.5 to 24.9 is normal.  A BMI of 25 to 29.9 is considered overweight.  A BMI of 30 and above  is considered obese.

## 2015-09-07 NOTE — Progress Notes (Signed)
Patient ID: OLUWATOMI SCHEFFLER, female   DOB: Oct 14, 1970, 45 y.o.   MRN: [id number]     Subjective:   Patient ID: DAMARIS HATTERY female    DOB: 12-25-1970 45 y.o.    MRN: [id number] Health Maintenance Due: Health Maintenance Due  Topic Date Due  . HIV Screening  02/26/1984  . PAP SMEAR  02/25/1990  . PNEUMOCOCCAL POLYSACCHARIDE VACCINE (2) 03/15/2015    _________________________________________________  HPI: Ms.Kenzey MAGNOLIA KOWITZ is a 45 y.o. female here for a breast pain.  Pt has a PMH outlined below.  Please see problem-based charting assessment and plan for further status of patient's chronic medical problems addressed at today's visit.  PMH: Past Medical History  Diagnosis Date  . Diabetes mellitus   . Allergic rhinitis   . Hypertension   . Hip pain, bilateral     ? lipomas  . Lymphedema     chronic, since age 56  . Mammogram abnormal     06, s/p biopsy of mass and neg work up for neoplasm  . Right ovarian cyst     s/p resection of r ovary 06  . Polyarthropathy     inflammatory  . Irregular menstruation   . GERD (gastroesophageal reflux disease)   . Wears glasses   . Obesity     Medications: Current Outpatient Prescriptions on File Prior to Visit  Medication Sig Dispense Refill  . ACCU-CHEK FASTCLIX LANCETS MISC 1 each by Does not apply route 2 (two) times daily. Use to check blood sugar twice a day. Diagnosis code E11.9 100 each 5  . acetaminophen (TYLENOL) 500 MG tablet Take 1,000 mg by mouth every 6 (six) hours as needed (pain).    . enalapril (VASOTEC) 10 MG tablet Take 1 tablet (10 mg total) by mouth daily. 90 tablet 1  . gabapentin (NEURONTIN) 300 MG capsule Take 1 capsule (300 mg total) by mouth 2 (two) times daily. (Patient taking differently: Take 300 mg by mouth at bedtime as needed (neuropathy in feet). ) 60 capsule 3  . glipiZIDE (GLUCOTROL) 5 MG tablet Take 1 tablet (5 mg total) by mouth 2 (two) times daily before a meal. 180 tablet 1  . metformin  (FORTAMET) 1000 MG (OSM) 24 hr tablet Take 2 tablets (2,000 mg total) by mouth daily with breakfast. 60 tablet 5  . naproxen (NAPROSYN) 500 MG tablet Take 1 tablet (500 mg total) by mouth 2 (two) times daily with a meal. 14 tablet 0   No current facility-administered medications on file prior to visit.    Allergies: Allergies  Allergen Reactions  . Shellfish Allergy Anaphylaxis    FH: Family History  Problem Relation Age of Onset  . Hypertension Mother   . Cancer Mother     breast cancer  . Anemia Mother   . Sickle cell trait Sister   . Cancer Maternal Uncle     breast cancer  . Cancer Paternal Grandmother     lung cancer  . Hypertension Paternal Grandmother   . Diabetes Paternal Grandmother     SH: Social History   Social History  . Marital Status: Married    Spouse Name: N/A  . Number of Children: N/A  . Years of Education: 10   Social History Main Topics  . Smoking status: Former Research scientist (life sciences)  . Smokeless tobacco: Former Systems developer     Comment: [place].  Marland Kitchen Alcohol Use: No  . Drug Use: No  . Sexual Activity: Not on file   Other Topics  Concern  . Not on file   Social History Narrative   Works as a Building control surveyor.   Lives at home with husband and daughter.    Review of Systems: Constitutional: Negative for fever, chills, +lymphadenopathy.  Breast: +left breast pain. Musculoskeletal: +shoulder pain.     Objective:   Vital Signs: Filed Vitals:   09/07/15 1446  BP: 122/68  Pulse: 94  Temp: 98.1 F (36.7 C)  TempSrc: Oral  Height: 5\' 5"  (1.651 m)  Weight: 285 lb 8 oz (129.502 kg)  SpO2: 100%      BP Readings from Last 3 Encounters:  09/07/15 122/68  08/09/15 123/73  08/07/15 146/88    Physical Exam: Constitutional: Vital signs reviewed.  Patient is in NAD and cooperative with exam.  Head: Normocephalic and atraumatic. Eyes: EOMI, conjunctivae nl, no scleral icterus.  Neck: Supple. Cardiovascular: RRR, no MRG. Breast: Left: 1x2cm lump in the lower  quadrant at ~6 o'clock position near the chest wall.  Lymph node under the left axilla.  Right: Normal.  Scar tissue from previous breast reduction surgery noted.  Pulmonary/Chest: normal effort, CTAB, no wheezes, rales, or rhonchi. Abdominal: Soft. NT/ND +BS. Neurological: A&O x3, cranial nerves II-XII are grossly intact, moving all extremities. Extremities: No LE edema. Skin: Warm, dry and intact. No rash.   Assessment & Plan:   Assessment and plan was discussed and formulated with my attending.

## 2015-09-07 NOTE — Assessment & Plan Note (Addendum)
Pt p/w a lump she found 3-4 days ago on her left breast that is tender to palpation.  Lump is located in the lower quadrant about the 6 o'clock position close to the chest wall.  Denies any fever/chills, rash, erythema, warmth.  Also notes some shoulder pain.  Denies any trauma to the breast.  She also noticed some lymph node swelling under the left axilla.  She had breast reduction surgery in 1996-1997.  She also had lumpectomy in high school which was noncancerous.  She reports a strong FH of breast cancer in her mother who was diagnosed in her 45s and also her aunt.  She had a normal mammogram in 2014 but has not had once since.   -Referral for diagnostic and screening mammogram

## 2015-09-09 NOTE — Progress Notes (Signed)
Internal Medicine Clinic Attending  Case discussed with Dr. Gill at the time of the visit.  We reviewed the resident's history and exam and pertinent patient test results.  I agree with the assessment, diagnosis, and plan of care documented in the resident's note.  

## 2015-09-28 ENCOUNTER — Ambulatory Visit (INDEPENDENT_AMBULATORY_CARE_PROVIDER_SITE_OTHER): Payer: Self-pay | Admitting: Physician Assistant

## 2015-09-28 VITALS — BP 128/82 | HR 72 | Temp 97.9°F | Resp 18 | Ht 66.0 in | Wt 279.4 lb

## 2015-09-28 DIAGNOSIS — Z029 Encounter for administrative examinations, unspecified: Secondary | ICD-10-CM

## 2015-09-28 NOTE — Progress Notes (Signed)
[facility] [address] [place] [address] [phone]  Date:  09/28/2015   Name:  Sharon Fisher   DOB:  1971-03-16   MRN:  [id number]  PCP:  Luanne Bras, MD    History of Present Illness:  Sharon Fisher is a 45 y.o. female patient who presents to [facility] for administrative exam. Patient has no current complaints or concerns at this time. Patient notes no chest pain, palpitations, shortness of breath, dizziness, vision changes. 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 She reports that this is not a strenuous job she rarely has to lift any heavy material or individuals.    Patient Active Problem List   Diagnosis Date Noted  . Lump of left breast 09/07/2015  . Healthcare maintenance 08/09/2015  . Shoulder pain 07/26/2015  . Low back pain 04/15/2015  . Fatigue 04/15/2015  . Vulvovaginal candidiasis 04/15/2014  . Flu vaccine need 07/15/2012  . Need for prophylactic vaccination with combined diphtheria-tetanus-pertussis (DTP) vaccine 07/15/2012  . Sebaceous cyst 07/15/2012  . HLD (hyperlipidemia) 09/19/2011  . Abscess of neck 12/07/2010  . SINUSITIS 08/29/2009  . ASTHMA 06/17/2009  . RLQ PAIN 03/15/2009  . FRACTURE, CLAVICLE, RIGHT 11/22/2008  . LIPOMA OF OTHER SKIN AND SUBCUTANEOUS TISSUE 05/05/2008  . NEUROPATHY 03/19/2008  . PALPITATIONS 03/19/2008  . GERD 07/02/2006  . Diabetes type 2, uncontrolled ([place]) 04/05/2006  . Benign essential HTN 04/05/2006  . LYMPHEDEMA 04/05/2006  . ALLERGIC RHINITIS 04/05/2006  . OVARIAN CYST 04/05/2006  . IRREGULAR MENSTRUATION 04/05/2006  . POLYARTHROPATHY, INFLAMMATORY NOS 04/05/2006  . HIP PAIN, BILATERAL 04/05/2006  . ABNORMAL MAMMOGRAM 04/05/2006    Past Medical History  Diagnosis Date  . Diabetes mellitus   . Allergic rhinitis   . Hypertension   . Hip pain, bilateral     ? lipomas  . Lymphedema     chronic, since age 45  . Mammogram abnormal     06, s/p biopsy of mass and neg work  up for neoplasm  . Right ovarian cyst     s/p resection of r ovary 06  . Polyarthropathy     inflammatory  . Irregular menstruation   . GERD (gastroesophageal reflux disease)   . Wears glasses   . Obesity     Past Surgical History  Procedure Laterality Date  . Tonsillectomy    . Surgery of fibroids      but still has fibroids  . Dilation and curettage of uterus    . Breast reduction surgery  1996  . Mass excision N/A 02/27/2013    Procedure: CYST REMOVAL NECK;  Surgeon: Harl Bowie, MD;  Location: [place];  Service: General;  Laterality: N/A;  Excision chronic posterior neck cyst    Social History  Substance Use Topics  . Smoking status: Former Research scientist (life sciences)  . Smokeless tobacco: Former Systems developer     Comment: [place].  Marland Kitchen Alcohol Use: No    Family History  Problem Relation Age of Onset  . Hypertension Mother   . Cancer Mother     breast cancer  . Anemia Mother   . Sickle cell trait Sister   . Cancer Maternal Uncle     breast cancer  . Cancer Paternal Grandmother     lung cancer  . Hypertension Paternal Grandmother   . Diabetes Paternal Grandmother     Allergies  Allergen Reactions  . Shellfish Allergy Anaphylaxis    Medication list has  been reviewed and updated.  Current Outpatient Prescriptions on File Prior to Visit  Medication Sig Dispense Refill  . ACCU-CHEK FASTCLIX LANCETS MISC 1 each by Does not apply route 2 (two) times daily. Use to check blood sugar twice a day. Diagnosis code E11.9 100 each 5  . acetaminophen (TYLENOL) 500 MG tablet Take 1,000 mg by mouth every 6 (six) hours as needed (pain).    . enalapril (VASOTEC) 10 MG tablet Take 1 tablet (10 mg total) by mouth daily. 90 tablet 1  . gabapentin (NEURONTIN) 300 MG capsule Take 1 capsule (300 mg total) by mouth 2 (two) times daily. (Patient taking differently: Take 300 mg by mouth at bedtime as needed (neuropathy in feet). ) 60 capsule 3  . glipiZIDE (GLUCOTROL) 5 MG tablet Take 1  tablet (5 mg total) by mouth 2 (two) times daily before a meal. 180 tablet 1  . metformin (FORTAMET) 1000 MG (OSM) 24 hr tablet Take 2 tablets (2,000 mg total) by mouth daily with breakfast. 60 tablet 5  . naproxen (NAPROSYN) 500 MG tablet Take 1 tablet (500 mg total) by mouth 2 (two) times daily with a meal. 14 tablet 0   No current facility-administered medications on file prior to visit.    Review of Systems  Constitutional: Negative for fever and chills.  HENT: Negative for ear discharge, ear pain and sore throat.   Eyes: Negative for blurred vision and double vision.  Respiratory: Negative for cough, shortness of breath and wheezing.   Cardiovascular: Negative for chest pain, palpitations and leg swelling.  Gastrointestinal: Negative for nausea, vomiting and diarrhea.  Genitourinary: Negative for dysuria, frequency and hematuria.  Skin: Negative for itching and rash.  Neurological: Negative for dizziness and headaches.     Physical Examination: BP 128/82 mmHg  Pulse 72  Temp(Src) 97.9 F (36.6 C) (Oral)  Resp 18  Ht 5\' 6"  (1.676 m)  Wt 279 lb 6.4 oz (126.735 kg)  BMI 45.12 kg/m2  SpO2 98% Ideal Body Weight: Weight in (lb) to have BMI = 25: 154.6  Physical Exam  Constitutional: She is oriented to person, place, and time. She appears well-developed and well-nourished. No distress.  HENT:  Head: Normocephalic and atraumatic.  Right Ear: Tympanic membrane, external ear and ear canal normal.  Left Ear: Tympanic membrane, external ear and ear canal normal.  Nose: Right sinus exhibits no maxillary sinus tenderness and no frontal sinus tenderness. Left sinus exhibits no maxillary sinus tenderness and no frontal sinus tenderness.  Mouth/Throat: Oropharynx is clear and moist. No uvula swelling. No oropharyngeal exudate, posterior oropharyngeal edema or posterior oropharyngeal erythema.  Eyes: Conjunctivae and EOM are normal. Pupils are equal, round, and reactive to light.  Neck:  Normal range of motion. Neck supple. No thyromegaly present.  Cardiovascular: Normal rate, regular rhythm, normal heart sounds and intact distal pulses.  Exam reveals no gallop, no distant heart sounds and no friction rub.   No murmur heard. Pulmonary/Chest: Effort normal and breath sounds normal. No respiratory distress. She has no decreased breath sounds. She has no wheezes. She has no rhonchi.  Abdominal: Soft. Bowel sounds are normal. She exhibits no distension and no mass. There is no tenderness.  Musculoskeletal: Normal range of motion. She exhibits no edema or tenderness.  Lymphadenopathy:       Head (right side): No submandibular, no tonsillar, no preauricular and no posterior auricular adenopathy present.       Head (left side): No submandibular, no tonsillar, no preauricular and no  posterior auricular adenopathy present.    She has no cervical adenopathy.  Neurological: She is alert and oriented to person, place, and time. No cranial nerve deficit. She exhibits normal muscle tone. Coordination normal.  Skin: Skin is warm and dry. She is not diaphoretic.  Psychiatric: She has a normal mood and affect. Her behavior is normal.     Assessment and Plan: Sharon Fisher is a 45 y.o. female who is here today for administrative exam.  Patient is clear to work for her job duties  Ivar Drape, PA-C [facility] 09/28/2015 4:00 PM

## 2015-09-28 NOTE — Patient Instructions (Addendum)
     IF you received an x-ray today, you will receive an invoice from [facility]. Please contact [facility] at [phone] with questions or concerns regarding your invoice.   IF you received labwork today, you will receive an invoice from [employer]. Please contact [facility] at [phone] with questions or concerns regarding your invoice.   Our billing staff will not be able to assist you with questions regarding bills from these companies.  You will be contacted with the lab results as soon as they are available. The fastest way to get your results is to activate your My Chart account. Instructions are located on the last page of this paperwork. If you have not heard from [place] regarding the results in 2 weeks, please contact this office.    For allergies you should take the zyrtec, allegra, or claritin.  Listed below are their generic forms. Cetirizine 10mg  daily loratidine 10mg  daily Fexofenadine 180mg  daily  You can use flonase (fluticasone is the generic name) 2 sprays daily. Make sure that you are hydrating well too.  This will lower mucus build up.  64oz or more per day.

## 2015-11-14 ENCOUNTER — Encounter: Payer: Self-pay | Admitting: *Deleted

## 2016-03-28 ENCOUNTER — Ambulatory Visit (INDEPENDENT_AMBULATORY_CARE_PROVIDER_SITE_OTHER): Payer: Self-pay | Admitting: Family Medicine

## 2016-03-28 DIAGNOSIS — Z0000 Encounter for general adult medical examination without abnormal findings: Secondary | ICD-10-CM

## 2016-03-31 LAB — TB SKIN TEST: TB Skin Test: NEGATIVE

## 2016-05-04 ENCOUNTER — Ambulatory Visit (INDEPENDENT_AMBULATORY_CARE_PROVIDER_SITE_OTHER): Payer: Self-pay | Admitting: Internal Medicine

## 2016-05-04 ENCOUNTER — Encounter (INDEPENDENT_AMBULATORY_CARE_PROVIDER_SITE_OTHER): Payer: Self-pay

## 2016-05-04 DIAGNOSIS — M545 Low back pain, unspecified: Secondary | ICD-10-CM

## 2016-05-04 DIAGNOSIS — IMO0002 Reserved for concepts with insufficient information to code with codable children: Secondary | ICD-10-CM

## 2016-05-04 DIAGNOSIS — G8929 Other chronic pain: Secondary | ICD-10-CM

## 2016-05-04 DIAGNOSIS — Z0000 Encounter for general adult medical examination without abnormal findings: Secondary | ICD-10-CM

## 2016-05-04 DIAGNOSIS — E1165 Type 2 diabetes mellitus with hyperglycemia: Principal | ICD-10-CM

## 2016-05-04 DIAGNOSIS — E119 Type 2 diabetes mellitus without complications: Secondary | ICD-10-CM

## 2016-05-04 DIAGNOSIS — E1121 Type 2 diabetes mellitus with diabetic nephropathy: Secondary | ICD-10-CM

## 2016-05-04 DIAGNOSIS — E118 Type 2 diabetes mellitus with unspecified complications: Secondary | ICD-10-CM

## 2016-05-04 DIAGNOSIS — Z7984 Long term (current) use of oral hypoglycemic drugs: Secondary | ICD-10-CM

## 2016-05-04 LAB — GLUCOSE, CAPILLARY: GLUCOSE-CAPILLARY: 91 mg/dL (ref 65–99)

## 2016-05-04 LAB — POCT GLYCOSYLATED HEMOGLOBIN (HGB A1C): Hemoglobin A1C: 7.4

## 2016-05-04 MED ORDER — METFORMIN HCL 1000 MG PO TABS: 1000.0000 mg | ORAL_TABLET | Freq: Two times a day (BID) | ORAL | 3 refills | Status: DC

## 2016-05-04 MED ORDER — GLIPIZIDE 5 MG PO TABS: 5.0000 mg | ORAL_TABLET | Freq: Two times a day (BID) | ORAL | 1 refills | Status: DC

## 2016-05-04 MED ORDER — METFORMIN HCL ER 500 MG PO TB24: 1000.0000 mg | ORAL_TABLET | Freq: Every day | ORAL | 3 refills | Status: DC

## 2016-05-04 MED ORDER — NAPROXEN 500 MG PO TABS: 500.0000 mg | ORAL_TABLET | Freq: Two times a day (BID) | ORAL | 0 refills | Status: DC

## 2016-05-04 NOTE — Assessment & Plan Note (Signed)
Patient requesting refill of naproxen for intermittent all over body aches. This is chronic and stable. Given refill of naproxen.

## 2016-05-04 NOTE — Assessment & Plan Note (Addendum)
Lab Results  Component Value Date   HGBA1C 7.4 05/04/2016   HGBA1C 9.3 07/26/2015   HGBA1C 9.1 04/15/2015     Assessment: Diabetes control:  not controlled Progress toward A1C goal:   near goal of less than 7 Comments: On metformin 1000 twice a day and glipizide 5 mg twice a day  Plan: Medications:  Continue current medication regimen. Educated on exercise and diet. Checking Bmet. Other plans: Follow up in 3 months with PCP

## 2016-05-04 NOTE — Assessment & Plan Note (Addendum)
Patient states that she has white vaginal discharge that she only notices during intercourse. Vaginal discharge is non-odorous, she does not have any vaginal itching or dysuria. This has been a chronic issue for her.  -Advised patient to return to clinic for an appointment for Pap smear to further evaluate this. She is also due for a Pap smear for cervical screening she has not had one since 1991.

## 2016-05-04 NOTE — Progress Notes (Signed)
   CC: Diabetes  HPI:  [name] is a 45 y.o. with past medical history as outlined below who presents to clinic for diabetes follow-up. Please see problem list for further details of her chronic medical issues.  Past Medical History:  Diagnosis Date  . Allergic rhinitis   . Diabetes mellitus   . GERD (gastroesophageal reflux disease)   . Hip pain, bilateral    ? lipomas  . Hypertension   . Irregular menstruation   . Lymphedema    chronic, since age 42  . Mammogram abnormal    06, s/p biopsy of mass and neg work up for neoplasm  . Obesity   . Polyarthropathy    inflammatory  . Right ovarian cyst    s/p resection of r ovary 06  . Wears glasses     Review of Systems:  Positive for vaginal discharge that she notices during sex. Denies vaginal itching, dysuria, foul odor. Denies nausea, vomiting, and decreased appetite.  Physical Exam:  Vitals:   05/04/16 1527  BP: (!) 141/81  Pulse: 84  Temp: 98.4 F (36.9 C)  TempSrc: Oral  SpO2: 100%  Weight: 286 lb 4.8 oz (129.9 kg)  Height: 5\' 5"  (1.651 m)   Physical Exam  Constitutional: She is oriented to person, place, and time. She appears well-developed and well-nourished. No distress.  HENT:  Head: Normocephalic and atraumatic.  Nose: Nose normal.  Cardiovascular: Normal rate, regular rhythm and normal heart sounds.  Exam reveals no gallop and no friction rub.   No murmur heard. Pulmonary/Chest: Effort normal and breath sounds normal. No respiratory distress. She has no wheezes. She has no rales. She exhibits no tenderness.  Abdominal: Soft. Bowel sounds are normal. She exhibits no distension and no mass. There is no tenderness. There is no rebound and no guarding.  Neurological: She is alert and oriented to person, place, and time.  Skin: Skin is warm and dry. No rash noted. She is not diaphoretic. No erythema. No pallor.    Assessment & Plan:   See Encounters Tab for problem based charting.  Patient  discussed with Dr. Evette Doffing

## 2016-05-04 NOTE — Addendum Note (Signed)
Addended by: Norman Herrlich on: 05/04/2016 04:38 PM   Modules accepted: Orders

## 2016-05-05 ENCOUNTER — Other Ambulatory Visit: Payer: Self-pay | Admitting: Internal Medicine

## 2016-05-05 DIAGNOSIS — I10 Essential (primary) hypertension: Secondary | ICD-10-CM

## 2016-05-05 LAB — BMP8+ANION GAP
ANION GAP: 16 mmol/L (ref 10.0–18.0)
BUN/Creatinine Ratio: 12 (ref 9–23)
BUN: 5 mg/dL — AB (ref 6–24)
CALCIUM: 8.6 mg/dL — AB (ref 8.7–10.2)
CO2: 22 mmol/L (ref 18–29)
Chloride: 103 mmol/L (ref 96–106)
Creatinine, Ser: 0.43 mg/dL — ABNORMAL LOW (ref 0.57–1.00)
GFR calc non Af Amer: 122 mL/min/{1.73_m2} (ref >59)
GFR, EST AFRICAN AMERICAN: 140 mL/min/{1.73_m2} (ref >59)
GLUCOSE: 81 mg/dL (ref 65–99)
POTASSIUM: 4.2 mmol/L (ref 3.5–5.2)
Sodium: 141 mmol/L (ref 134–144)

## 2016-05-07 NOTE — Progress Notes (Signed)
Internal Medicine Clinic Attending  Case discussed with Dr. Truong at the time of the visit.  We reviewed the resident's history and exam and pertinent patient test results.  I agree with the assessment, diagnosis, and plan of care documented in the resident's note.  

## 2016-06-21 ENCOUNTER — Emergency Department (HOSPITAL_COMMUNITY)
Admission: EM | Admit: 2016-06-21 | Discharge: 2016-06-21 | Disposition: A | Discharge status: Home / Self Care | Payer: Medicaid Other | Attending: Emergency Medicine | Admitting: Emergency Medicine

## 2016-06-21 ENCOUNTER — Encounter (HOSPITAL_COMMUNITY): Payer: Self-pay | Admitting: *Deleted

## 2016-06-21 ENCOUNTER — Other Ambulatory Visit: Payer: Self-pay | Admitting: Internal Medicine

## 2016-06-21 DIAGNOSIS — I10 Essential (primary) hypertension: Secondary | ICD-10-CM | POA: Insufficient documentation

## 2016-06-21 DIAGNOSIS — Z7984 Long term (current) use of oral hypoglycemic drugs: Secondary | ICD-10-CM | POA: Insufficient documentation

## 2016-06-21 DIAGNOSIS — Z79899 Other long term (current) drug therapy: Secondary | ICD-10-CM | POA: Insufficient documentation

## 2016-06-21 DIAGNOSIS — J45909 Unspecified asthma, uncomplicated: Secondary | ICD-10-CM | POA: Insufficient documentation

## 2016-06-21 DIAGNOSIS — K047 Periapical abscess without sinus: Secondary | ICD-10-CM | POA: Insufficient documentation

## 2016-06-21 DIAGNOSIS — Z87891 Personal history of nicotine dependence: Secondary | ICD-10-CM | POA: Insufficient documentation

## 2016-06-21 DIAGNOSIS — E1140 Type 2 diabetes mellitus with diabetic neuropathy, unspecified: Secondary | ICD-10-CM | POA: Insufficient documentation

## 2016-06-21 MED ORDER — HYDROCODONE-ACETAMINOPHEN 5-325 MG PO TABS: 2.0000 | ORAL_TABLET | ORAL | 0 refills | Status: DC | PRN

## 2016-06-21 MED ORDER — HYDROCODONE-ACETAMINOPHEN 5-325 MG PO TABS
1.0000 | ORAL_TABLET | Freq: Once | ORAL | Status: AC
  Administered 2016-06-21: 1 via ORAL
  Filled 2016-06-21: qty 1

## 2016-06-21 MED ORDER — PENICILLIN V POTASSIUM 250 MG PO TABS: 250.0000 mg | ORAL_TABLET | Freq: Four times a day (QID) | ORAL | 0 refills | Status: AC

## 2016-06-21 NOTE — ED Provider Notes (Signed)
[place] DEPT Provider Note   CSN: [id number] Arrival date & time: 06/21/16  1248     History   Chief Complaint Chief Complaint  Patient presents with  . Dental Pain    HPI Sharon Fisher is a 46 y.o. female.  Patient has had a cavity and: This tooth for some time but did not start vomiting hurtle 3 days ago. Now she's having severe pain that radiates to her left ear, sinuses and down to her jaw. She denies any trismus or notable swelling.   The history is provided by the patient.  Dental Pain   This is a new problem. Episode onset: 3 days ago. The problem occurs constantly. The problem has been gradually worsening. The pain is at a severity of 9/10. The pain is severe. She has tried acetaminophen for the symptoms. The treatment provided no relief.    Past Medical History:  Diagnosis Date  . Allergic rhinitis   . Diabetes mellitus   . GERD (gastroesophageal reflux disease)   . Hip pain, bilateral    ? lipomas  . Hypertension   . Irregular menstruation   . Lymphedema    chronic, since age 70  . Mammogram abnormal    06, s/p biopsy of mass and neg work up for neoplasm  . Obesity   . Polyarthropathy    inflammatory  . Right ovarian cyst    s/p resection of r ovary 06  . Wears glasses     Patient Active Problem List   Diagnosis Date Noted  . Lump of left breast 09/07/2015  . Healthcare maintenance 08/09/2015  . Shoulder pain 07/26/2015  . Low back pain 04/15/2015  . Fatigue 04/15/2015  . Vulvovaginal candidiasis 04/15/2014  . Flu vaccine need 07/15/2012  . Need for prophylactic vaccination with combined diphtheria-tetanus-pertussis (DTP) vaccine 07/15/2012  . Sebaceous cyst 07/15/2012  . HLD (hyperlipidemia) 09/19/2011  . Abscess of neck 12/07/2010  . SINUSITIS 08/29/2009  . ASTHMA 06/17/2009  . RLQ PAIN 03/15/2009  . FRACTURE, CLAVICLE, RIGHT 11/22/2008  . LIPOMA OF OTHER SKIN AND SUBCUTANEOUS TISSUE 05/05/2008  . NEUROPATHY 03/19/2008  .  PALPITATIONS 03/19/2008  . GERD 07/02/2006  . Diabetes type 2, uncontrolled ([place]) 04/05/2006  . Benign essential HTN 04/05/2006  . LYMPHEDEMA 04/05/2006  . ALLERGIC RHINITIS 04/05/2006  . OVARIAN CYST 04/05/2006  . IRREGULAR MENSTRUATION 04/05/2006  . POLYARTHROPATHY, INFLAMMATORY NOS 04/05/2006  . HIP PAIN, BILATERAL 04/05/2006  . ABNORMAL MAMMOGRAM 04/05/2006    Past Surgical History:  Procedure Laterality Date  . BREAST REDUCTION SURGERY  1996  . DILATION AND CURETTAGE OF UTERUS    . MASS EXCISION N/A 02/27/2013   Procedure: CYST REMOVAL NECK;  Surgeon: Harl Bowie, MD;  Location: [place];  Service: General;  Laterality: N/A;  Excision chronic posterior neck cyst  . surgery of fibroids     but still has fibroids  . TONSILLECTOMY      OB History    No data available       Home Medications    Prior to Admission medications   Medication Sig Start Date End Date Taking? Authorizing Provider  ACCU-CHEK FASTCLIX LANCETS MISC 1 each by Does not apply route 2 (two) times daily. Use to check blood sugar twice a day. Diagnosis code E11.9 11/12/14   Milagros Loll, MD  acetaminophen (TYLENOL) 500 MG tablet Take 1,000 mg by mouth every 6 (six) hours as needed (pain).    Historical Provider, MD  enalapril (VASOTEC)  10 MG tablet TAKE ONE TABLET BY MOUTH DAILY 05/08/16   Minus Liberty, MD  gabapentin (NEURONTIN) 300 MG capsule Take 1 capsule (300 mg total) by mouth 2 (two) times daily. Patient taking differently: Take 300 mg by mouth at bedtime as needed (neuropathy in feet).  04/15/15   Alexa Angela Burke, MD  glipiZIDE (GLUCOTROL) 5 MG tablet Take 1 tablet (5 mg total) by mouth 2 (two) times daily before a meal. 05/04/16 05/03/17  Norman Herrlich, MD  HYDROcodone-acetaminophen (NORCO/VICODIN) 5-325 MG tablet Take 2 tablets by mouth every 4 (four) hours as needed. 06/21/16   Blanchie Dessert, MD  metFORMIN (GLUCOPHAGE) 1000 MG tablet Take 1 tablet (1,000 mg total) by  mouth 2 (two) times daily with a meal. 05/04/16   Norman Herrlich, MD  naproxen (NAPROSYN) 500 MG tablet Take 1 tablet (500 mg total) by mouth 2 (two) times daily with a meal. 05/04/16   Norman Herrlich, MD  penicillin v potassium (VEETID) 250 MG tablet Take 1 tablet (250 mg total) by mouth 4 (four) times daily. 06/21/16 06/28/16  Blanchie Dessert, MD    Family History Family History  Problem Relation Age of Onset  . Hypertension Mother   . Cancer Mother     breast cancer  . Anemia Mother   . Sickle cell trait Sister   . Cancer Maternal Uncle     breast cancer  . Cancer Paternal Grandmother     lung cancer  . Hypertension Paternal Grandmother   . Diabetes Paternal Grandmother     Social History Social History  Substance Use Topics  . Smoking status: Former Research scientist (life sciences)  . Smokeless tobacco: Former Systems developer     Comment: [place].  Marland Kitchen Alcohol use No     Allergies   Shellfish allergy   Review of Systems Review of Systems  All other systems reviewed and are negative.    Physical Exam Updated Vital Signs BP 148/93   Pulse 94   Temp 99 F (37.2 C) (Oral)   Resp 21   SpO2 99%   Physical Exam  Constitutional: She is oriented to person, place, and time. She appears well-developed and well-nourished. No distress.  HENT:  Head: Normocephalic and atraumatic.  Left Ear: Tympanic membrane normal.  Mouth/Throat:    Eyes: EOM are normal. Pupils are equal, round, and reactive to light.  Cardiovascular: Normal rate.   Pulmonary/Chest: Effort normal.  Neurological: She is alert and oriented to person, place, and time.  Skin: Skin is warm and dry.  Psychiatric: She has a normal mood and affect. Her behavior is normal.  Nursing note and vitals reviewed.    ED Treatments / Results  Labs (all labs ordered are listed, but only abnormal results are displayed) Labs Reviewed - No data to display  EKG  EKG Interpretation None       Radiology No results  found.  Procedures Procedures (including critical care time)  Medications Ordered in ED Medications  HYDROcodone-acetaminophen (NORCO/VICODIN) 5-325 MG per tablet 1 tablet (not administered)     Initial Impression / Assessment and Plan / ED Course  I have reviewed the triage vital signs and the nursing notes.  Pertinent labs & imaging results that were available during my care of the patient were reviewed by me and considered in my medical decision making (see chart for details).    Pt with dental caries without facial swelling.  No signs of ludwig's angina or difficulty swallowing and no systemic symptoms. Will treat  with PCN and have pt f/u with dentist.   Final Clinical Impressions(s) / ED Diagnoses   Final diagnoses:  Dental infection    New Prescriptions New Prescriptions   HYDROCODONE-ACETAMINOPHEN (NORCO/VICODIN) 5-325 MG TABLET    Take 2 tablets by mouth every 4 (four) hours as needed.   PENICILLIN V POTASSIUM (VEETID) 250 MG TABLET    Take 1 tablet (250 mg total) by mouth 4 (four) times daily.     Blanchie Dessert, MD 06/21/16 1409

## 2016-06-21 NOTE — ED Triage Notes (Signed)
Pt is here with left upper molar pain and it radiates down and around into left ear.  Pt states there is a hole in her tooth

## 2016-06-21 NOTE — ED Notes (Signed)
ED Provider at bedside. 

## 2016-10-22 ENCOUNTER — Emergency Department (HOSPITAL_COMMUNITY)
Admission: EM | Admit: 2016-10-22 | Discharge: 2016-10-22 | Disposition: A | Discharge status: Home / Self Care | Payer: No Typology Code available for payment source | Source: Home / Self Care | Attending: Emergency Medicine | Admitting: Emergency Medicine

## 2016-10-22 ENCOUNTER — Encounter (HOSPITAL_COMMUNITY): Payer: Self-pay | Admitting: *Deleted

## 2016-10-22 ENCOUNTER — Encounter (HOSPITAL_COMMUNITY): Payer: Self-pay | Admitting: Emergency Medicine

## 2016-10-22 ENCOUNTER — Emergency Department (HOSPITAL_COMMUNITY): Payer: No Typology Code available for payment source

## 2016-10-22 ENCOUNTER — Emergency Department (HOSPITAL_COMMUNITY)
Admission: EM | Admit: 2016-10-22 | Discharge: 2016-10-22 | Disposition: A | Discharge status: Home / Self Care | Payer: No Typology Code available for payment source | Attending: Emergency Medicine | Admitting: Emergency Medicine

## 2016-10-22 DIAGNOSIS — J45909 Unspecified asthma, uncomplicated: Secondary | ICD-10-CM

## 2016-10-22 DIAGNOSIS — Y939 Activity, unspecified: Secondary | ICD-10-CM

## 2016-10-22 DIAGNOSIS — Z79899 Other long term (current) drug therapy: Secondary | ICD-10-CM

## 2016-10-22 DIAGNOSIS — I10 Essential (primary) hypertension: Secondary | ICD-10-CM | POA: Insufficient documentation

## 2016-10-22 DIAGNOSIS — Z87891 Personal history of nicotine dependence: Secondary | ICD-10-CM

## 2016-10-22 DIAGNOSIS — Z7984 Long term (current) use of oral hypoglycemic drugs: Secondary | ICD-10-CM | POA: Insufficient documentation

## 2016-10-22 DIAGNOSIS — Z5321 Procedure and treatment not carried out due to patient leaving prior to being seen by health care provider: Secondary | ICD-10-CM | POA: Insufficient documentation

## 2016-10-22 DIAGNOSIS — E1140 Type 2 diabetes mellitus with diabetic neuropathy, unspecified: Secondary | ICD-10-CM

## 2016-10-22 DIAGNOSIS — Y92410 Unspecified street and highway as the place of occurrence of the external cause: Secondary | ICD-10-CM

## 2016-10-22 DIAGNOSIS — M25552 Pain in left hip: Secondary | ICD-10-CM | POA: Diagnosis not present

## 2016-10-22 DIAGNOSIS — E119 Type 2 diabetes mellitus without complications: Secondary | ICD-10-CM | POA: Diagnosis not present

## 2016-10-22 DIAGNOSIS — S79912D Unspecified injury of left hip, subsequent encounter: Secondary | ICD-10-CM | POA: Diagnosis present

## 2016-10-22 DIAGNOSIS — Y999 Unspecified external cause status: Secondary | ICD-10-CM | POA: Insufficient documentation

## 2016-10-22 DIAGNOSIS — M7918 Myalgia, other site: Secondary | ICD-10-CM

## 2016-10-22 MED ORDER — METHOCARBAMOL 500 MG PO TABS
500.0000 mg | ORAL_TABLET | Freq: Two times a day (BID) | ORAL | 0 refills | Status: DC
Start: 2016-10-22 — End: 2016-11-19

## 2016-10-22 MED ORDER — IBUPROFEN 200 MG PO TABS
600.0000 mg | ORAL_TABLET | Freq: Once | ORAL | Status: AC
  Administered 2016-10-22: 600 mg via ORAL
  Filled 2016-10-22: qty 3

## 2016-10-22 MED ORDER — DICLOFENAC SODIUM 75 MG PO TBEC: 75.0000 mg | DELAYED_RELEASE_TABLET | Freq: Two times a day (BID) | ORAL | 0 refills | Status: DC
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
## 2016-10-22 NOTE — ED Notes (Signed)
See provider assessment 
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
## 2016-10-22 NOTE — ED Notes (Signed)
Patient is alert and oriented x3.  She was given DC instructions and follow up visit instructions.  Patient gave verbal understanding. She was DC ambulatory under her own power to home.  V/S stable.  He was not showing any signs of distress on DC 

## 2016-10-22 NOTE — Discharge Instructions (Signed)
Return if any problems.
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
## 2016-10-24 LAB — HM DIABETES EYE EXAM

## 2016-10-30 ENCOUNTER — Ambulatory Visit (INDEPENDENT_AMBULATORY_CARE_PROVIDER_SITE_OTHER): Payer: Self-pay | Admitting: Internal Medicine

## 2016-10-30 VITALS — BP 133/72 | Temp 97.8°F | Ht 65.0 in | Wt 278.4 lb

## 2016-10-30 DIAGNOSIS — M545 Low back pain, unspecified: Secondary | ICD-10-CM

## 2016-10-30 MED ORDER — NAPROXEN 500 MG PO TABS: 500.0000 mg | ORAL_TABLET | Freq: Two times a day (BID) | ORAL | 0 refills | Status: DC

## 2016-10-30 MED ORDER — ACETAMINOPHEN 500 MG PO TABS: 1000.0000 mg | ORAL_TABLET | Freq: Four times a day (QID) | ORAL | 0 refills | Status: DC | PRN
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
## 2016-10-31 NOTE — Progress Notes (Signed)
Internal Medicine Clinic Attending  Case discussed with Dr. Guilloud at the time of the visit.  We reviewed the resident's history and exam and pertinent patient test results.  I agree with the assessment, diagnosis, and plan of care documented in the resident's note.  

## 2016-11-01 ENCOUNTER — Encounter: Payer: Self-pay | Admitting: *Deleted

## 2016-11-09 ENCOUNTER — Encounter: Payer: Self-pay | Admitting: *Deleted

## 2016-11-15 ENCOUNTER — Ambulatory Visit: Payer: Medicaid Other

## 2016-11-19 ENCOUNTER — Ambulatory Visit (INDEPENDENT_AMBULATORY_CARE_PROVIDER_SITE_OTHER): Payer: Self-pay | Admitting: Internal Medicine

## 2016-11-19 VITALS — BP 147/87 | HR 87 | Temp 98.2°F | Wt 279.4 lb

## 2016-11-19 DIAGNOSIS — M545 Low back pain, unspecified: Secondary | ICD-10-CM

## 2016-11-19 DIAGNOSIS — M25512 Pain in left shoulder: Secondary | ICD-10-CM

## 2016-11-19 DIAGNOSIS — M25552 Pain in left hip: Secondary | ICD-10-CM

## 2016-11-19 MED ORDER — METHOCARBAMOL 500 MG PO TABS: 500.0000 mg | ORAL_TABLET | Freq: Every evening | ORAL | 0 refills | Status: DC | PRN

## 2016-11-19 MED ORDER — NAPROXEN 500 MG PO TABS: 500.0000 mg | ORAL_TABLET | Freq: Two times a day (BID) | ORAL | 0 refills | Status: DC

## 2016-11-19 MED ORDER — ACETAMINOPHEN 500 MG PO TABS: 1000.0000 mg | ORAL_TABLET | Freq: Three times a day (TID) | ORAL | 0 refills | Status: AC | PRN
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
## 2016-11-19 NOTE — Patient Instructions (Addendum)
Ms. Hynson it was nice meeting you today.  -Continue taking Naproxen and Tylenol as instructed  -Continue taking Robaxin at bedtime. Do not take this medication during the day as it may make you drowsy. Do not drive or operate heavy machinery while taking this medication.  -I have referred you to physical therapy.  -Return for a follow-up visit in 4 weeks.  -Please seek immediate medical attention if you experience worsening back pain, loss of bowel/ bladder control, or numbness in your groin area.

## 2016-11-19 NOTE — Progress Notes (Addendum)
   CC: Patient is here to discuss left-sided back pain.  HPI:  Sharon Fisher is a 46 y.o. female with a past medical history of conditions listed below presenting to the clinic to discuss left-sided back pain. Please see problem based charting for the status of the patient's current and chronic medical conditions.   Past Medical History:  Diagnosis Date  . Allergic rhinitis   . Diabetes mellitus   . GERD (gastroesophageal reflux disease)   . Hip pain, bilateral    ? lipomas  . Hypertension   . Irregular menstruation   . Lymphedema    chronic, since age 42  . Mammogram abnormal    06, s/p biopsy of mass and neg work up for neoplasm  . Obesity   . Polyarthropathy    inflammatory  . Right ovarian cyst    s/p resection of r ovary 06  . Wears glasses     Review of Systems: Pertinent positives mentioned in HPI. Remainder of all ROS negative.   Physical Exam:  Vitals:   11/19/16 0935  BP: (!) 147/87  Pulse: 87  Temp: 98.2 F (36.8 C)  TempSrc: Oral  SpO2: 100%  Weight: 279 lb 6.4 oz (126.7 kg)   Physical Exam  Constitutional: She is oriented to person, place, and time. She appears well-developed and well-nourished. No distress.  HENT:  Head: Normocephalic and atraumatic.  Eyes: Right eye exhibits no discharge. Left eye exhibits no discharge.  Cardiovascular: Normal rate, regular rhythm and intact distal pulses.   Pulmonary/Chest: Effort normal and breath sounds normal. No respiratory distress. She has no wheezes. She has no rales.  Abdominal: Soft. Bowel sounds are normal. She exhibits no distension. There is no tenderness.  Musculoskeletal:  Neck: Normal range of motion. Cervical, thoracic, and lumbar spine nontender to palpation. Left shoulder blade tender to palpation. Left-sided paraspinal muscles in the lumbar region tender to palpation. Left hip: Tenderness to palpation. Normal range of motion. Normal sensation to light touch, pinprick, and cold temperature  on the anterior, lateral, and posterior aspects of the left thigh. Motor strength 5 out of 5 in bilateral lower extremities. Straight leg raise test negative bilaterally.  Neurological: She is alert and oriented to person, place, and time.  Skin: Skin is warm and dry.    Assessment & Plan:   See Encounters Tab for problem based charting.  Patient discussed with Dr. Daryll Drown

## 2016-11-20 NOTE — Progress Notes (Signed)
Internal Medicine Clinic Attending  Case discussed with Dr. Rathoreat the time of the visit. We reviewed the resident's history and exam and pertinent patient test results. I agree with the assessment, diagnosis, and plan of care documented in the resident's note.  

## 2016-11-21 ENCOUNTER — Ambulatory Visit: Payer: No Typology Code available for payment source | Attending: Internal Medicine | Admitting: Physical Therapy

## 2016-11-21 DIAGNOSIS — M546 Pain in thoracic spine: Secondary | ICD-10-CM

## 2016-11-21 DIAGNOSIS — M542 Cervicalgia: Secondary | ICD-10-CM | POA: Insufficient documentation

## 2016-11-21 DIAGNOSIS — M25511 Pain in right shoulder: Secondary | ICD-10-CM | POA: Diagnosis present

## 2016-11-21 DIAGNOSIS — M545 Low back pain, unspecified: Secondary | ICD-10-CM

## 2016-11-21 DIAGNOSIS — M25552 Pain in left hip: Secondary | ICD-10-CM | POA: Diagnosis present

## 2016-11-21 DIAGNOSIS — M25512 Pain in left shoulder: Secondary | ICD-10-CM | POA: Insufficient documentation
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
## 2016-12-03 ENCOUNTER — Ambulatory Visit: Payer: No Typology Code available for payment source | Attending: Internal Medicine | Admitting: Physical Therapy

## 2016-12-03 DIAGNOSIS — M25512 Pain in left shoulder: Secondary | ICD-10-CM | POA: Diagnosis present

## 2016-12-03 DIAGNOSIS — M542 Cervicalgia: Secondary | ICD-10-CM | POA: Diagnosis present

## 2016-12-03 DIAGNOSIS — M25511 Pain in right shoulder: Secondary | ICD-10-CM | POA: Diagnosis present

## 2016-12-03 DIAGNOSIS — M545 Low back pain, unspecified: Secondary | ICD-10-CM

## 2016-12-03 DIAGNOSIS — M546 Pain in thoracic spine: Secondary | ICD-10-CM | POA: Diagnosis present

## 2016-12-03 DIAGNOSIS — M25552 Pain in left hip: Secondary | ICD-10-CM | POA: Insufficient documentation
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
## 2016-12-10 ENCOUNTER — Encounter: Payer: Self-pay | Admitting: Physical Therapy

## 2016-12-10 ENCOUNTER — Telehealth: Payer: Self-pay

## 2016-12-10 ENCOUNTER — Ambulatory Visit: Payer: No Typology Code available for payment source | Admitting: Physical Therapy

## 2016-12-10 DIAGNOSIS — M546 Pain in thoracic spine: Secondary | ICD-10-CM

## 2016-12-10 DIAGNOSIS — M25511 Pain in right shoulder: Secondary | ICD-10-CM

## 2016-12-10 DIAGNOSIS — M545 Low back pain, unspecified: Secondary | ICD-10-CM

## 2016-12-10 DIAGNOSIS — M25552 Pain in left hip: Secondary | ICD-10-CM

## 2016-12-10 DIAGNOSIS — M25512 Pain in left shoulder: Secondary | ICD-10-CM | POA: Diagnosis not present

## 2016-12-10 DIAGNOSIS — M542 Cervicalgia: Secondary | ICD-10-CM

## 2016-12-10 NOTE — Telephone Encounter (Signed)
Needs to speak with a nurse. Please call back.  
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
## 2016-12-12 ENCOUNTER — Ambulatory Visit: Payer: No Typology Code available for payment source | Admitting: Physical Therapy

## 2016-12-12 DIAGNOSIS — M25511 Pain in right shoulder: Secondary | ICD-10-CM

## 2016-12-12 DIAGNOSIS — M25552 Pain in left hip: Secondary | ICD-10-CM

## 2016-12-12 DIAGNOSIS — M25512 Pain in left shoulder: Secondary | ICD-10-CM | POA: Diagnosis not present

## 2016-12-12 DIAGNOSIS — M545 Low back pain, unspecified: Secondary | ICD-10-CM

## 2016-12-12 DIAGNOSIS — M546 Pain in thoracic spine: Secondary | ICD-10-CM

## 2016-12-12 DIAGNOSIS — M542 Cervicalgia: Secondary | ICD-10-CM

## 2016-12-13 ENCOUNTER — Ambulatory Visit: Payer: Medicaid Other
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
## 2016-12-14 ENCOUNTER — Ambulatory Visit: Payer: Medicaid Other

## 2016-12-17 ENCOUNTER — Encounter: Payer: Self-pay | Admitting: Physical Therapy

## 2016-12-17 ENCOUNTER — Ambulatory Visit: Payer: No Typology Code available for payment source | Admitting: Physical Therapy

## 2016-12-17 DIAGNOSIS — M25552 Pain in left hip: Secondary | ICD-10-CM

## 2016-12-17 DIAGNOSIS — M25512 Pain in left shoulder: Secondary | ICD-10-CM

## 2016-12-17 DIAGNOSIS — M545 Low back pain, unspecified: Secondary | ICD-10-CM

## 2016-12-17 DIAGNOSIS — M25511 Pain in right shoulder: Secondary | ICD-10-CM

## 2016-12-17 DIAGNOSIS — M542 Cervicalgia: Secondary | ICD-10-CM

## 2016-12-17 DIAGNOSIS — M546 Pain in thoracic spine: Secondary | ICD-10-CM
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
## 2016-12-19 ENCOUNTER — Ambulatory Visit: Payer: No Typology Code available for payment source | Admitting: Physical Therapy

## 2016-12-19 DIAGNOSIS — M25512 Pain in left shoulder: Secondary | ICD-10-CM | POA: Diagnosis not present

## 2016-12-19 DIAGNOSIS — M545 Low back pain, unspecified: Secondary | ICD-10-CM

## 2016-12-19 DIAGNOSIS — M25511 Pain in right shoulder: Secondary | ICD-10-CM

## 2016-12-19 DIAGNOSIS — M542 Cervicalgia: Secondary | ICD-10-CM

## 2016-12-19 DIAGNOSIS — M25552 Pain in left hip: Secondary | ICD-10-CM

## 2016-12-19 DIAGNOSIS — M546 Pain in thoracic spine: Secondary | ICD-10-CM
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
## 2016-12-24 ENCOUNTER — Encounter: Payer: Self-pay | Admitting: Internal Medicine

## 2016-12-24 ENCOUNTER — Ambulatory Visit: Payer: No Typology Code available for payment source | Admitting: Physical Therapy

## 2016-12-24 ENCOUNTER — Ambulatory Visit (INDEPENDENT_AMBULATORY_CARE_PROVIDER_SITE_OTHER): Payer: Self-pay | Admitting: Internal Medicine

## 2016-12-24 ENCOUNTER — Other Ambulatory Visit (HOSPITAL_COMMUNITY)
Admission: RE | Admit: 2016-12-24 | Discharge: 2016-12-24 | Disposition: A | Discharge status: Home / Self Care | Payer: Medicaid Other | Source: Ambulatory Visit | Attending: Internal Medicine | Admitting: Internal Medicine

## 2016-12-24 VITALS — BP 149/76 | HR 88 | Temp 98.4°F | Ht 65.0 in | Wt 273.1 lb

## 2016-12-24 DIAGNOSIS — L298 Other pruritus: Secondary | ICD-10-CM | POA: Insufficient documentation

## 2016-12-24 DIAGNOSIS — Z79899 Other long term (current) drug therapy: Secondary | ICD-10-CM

## 2016-12-24 DIAGNOSIS — N898 Other specified noninflammatory disorders of vagina: Secondary | ICD-10-CM

## 2016-12-24 DIAGNOSIS — Z8249 Family history of ischemic heart disease and other diseases of the circulatory system: Secondary | ICD-10-CM

## 2016-12-24 DIAGNOSIS — F419 Anxiety disorder, unspecified: Secondary | ICD-10-CM

## 2016-12-24 DIAGNOSIS — Z87891 Personal history of nicotine dependence: Secondary | ICD-10-CM

## 2016-12-24 DIAGNOSIS — I10 Essential (primary) hypertension: Secondary | ICD-10-CM

## 2016-12-24 MED ORDER — PROPRANOLOL HCL 40 MG PO TABS: 40.0000 mg | ORAL_TABLET | ORAL | 0 refills | Status: DC | PRN
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
## 2016-12-24 NOTE — Patient Instructions (Addendum)
Sharon Fisher,   For you anxiety with driving,  Try taking propranol 40 mg 1-1.5 hours prior to driving  I will call in a medication based on the results of your test today   Call the clinic if you need anything  Schedule a follow up appointment in the acute care clinic in one month

## 2016-12-25 ENCOUNTER — Telehealth: Payer: Self-pay

## 2016-12-25 ENCOUNTER — Other Ambulatory Visit: Payer: Self-pay | Admitting: *Deleted

## 2016-12-25 DIAGNOSIS — I10 Essential (primary) hypertension: Secondary | ICD-10-CM

## 2016-12-25 DIAGNOSIS — B3731 Acute candidiasis of vulva and vagina: Secondary | ICD-10-CM

## 2016-12-25 DIAGNOSIS — B373 Candidiasis of vulva and vagina: Secondary | ICD-10-CM

## 2016-12-25 LAB — CERVICOVAGINAL ANCILLARY ONLY
Bacterial vaginitis: NEGATIVE
Candida vaginitis: NEGATIVE
Chlamydia: NEGATIVE
Neisseria Gonorrhea: NEGATIVE
Trichomonas: NEGATIVE

## 2016-12-25 MED ORDER — ENALAPRIL MALEATE 10 MG PO TABS: 10.0000 mg | ORAL_TABLET | Freq: Every day | ORAL | 1 refills | Status: DC

## 2016-12-25 MED ORDER — FLUCONAZOLE 150 MG PO TABS: 150.0000 mg | ORAL_TABLET | ORAL | 0 refills | Status: DC

## 2016-12-25 NOTE — Telephone Encounter (Signed)
Spoke w/ pt, sent refill request plus she wants diflucan while she awaits results of labs, sent that request to dr blum

## 2016-12-25 NOTE — Telephone Encounter (Signed)
enalapril (VASOTEC) 10 MG tablet, is not at the pharmacy. Pt is using [facility]. Also requesting lab results. Please call pt back.

## 2016-12-26 ENCOUNTER — Encounter: Payer: Self-pay | Admitting: Physical Therapy

## 2016-12-26 ENCOUNTER — Ambulatory Visit: Payer: No Typology Code available for payment source | Admitting: Physical Therapy

## 2016-12-26 DIAGNOSIS — M545 Low back pain, unspecified: Secondary | ICD-10-CM

## 2016-12-26 DIAGNOSIS — M25512 Pain in left shoulder: Secondary | ICD-10-CM

## 2016-12-26 DIAGNOSIS — M542 Cervicalgia: Secondary | ICD-10-CM

## 2016-12-26 DIAGNOSIS — M25552 Pain in left hip: Secondary | ICD-10-CM

## 2016-12-26 DIAGNOSIS — M25511 Pain in right shoulder: Secondary | ICD-10-CM

## 2016-12-26 DIAGNOSIS — M546 Pain in thoracic spine: Secondary | ICD-10-CM
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
## 2016-12-27 DIAGNOSIS — F419 Anxiety disorder, unspecified: Secondary | ICD-10-CM | POA: Insufficient documentation

## 2016-12-27 DIAGNOSIS — N898 Other specified noninflammatory disorders of vagina: Secondary | ICD-10-CM

## 2016-12-27 HISTORY — DX: Other specified noninflammatory disorders of vagina: N89.8
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
## 2016-12-27 NOTE — Assessment & Plan Note (Signed)
BP Readings from Last 3 Encounters:  12/24/16 (!) 149/76  11/19/16 (!) 147/87  10/30/16 133/72  Blood pressure not controlled today but she reports that she has been out of the enalapril prescription for months. Her blood pressure has previously been well controlled with enalapril -refilled enalapril  - RTC in one month for recheck

## 2016-12-27 NOTE — Assessment & Plan Note (Signed)
Patient describes vaginal itching associated with odorless white discharge. This is similar to yeast infections in the past. She denies dysuria, frequency, urgency, or dysparenunia. She is sexually active with her husband and denies new sexual partners.  There is vaginal dryness on exam today and she describes symptoms of perimenopause. -prescribed fluconazole  - Wet prep today   ADDENDUM Wet prep was negative for chlamydia, gonorrhea, BV, Trichomonas and candida vaginitis.  I called and spoke with the patient regarding these results, her symptoms may be related to vaginal dryness more than a yeast infection. I advised her to use an over the counter moisturizer such as Replens for now. Her symptoms should be followed up  at her next visit.

## 2016-12-29 NOTE — Progress Notes (Signed)
Internal Medicine Clinic Attending  Case discussed with Dr. Blum at the time of the visit.  We reviewed the resident's history and exam and pertinent patient test results.  I agree with the assessment, diagnosis, and plan of care documented in the resident's note. 

## 2016-12-31 ENCOUNTER — Encounter: Payer: Medicaid Other | Admitting: Physical Therapy

## 2017-01-02 ENCOUNTER — Ambulatory Visit: Payer: No Typology Code available for payment source | Admitting: Physical Therapy

## 2017-01-09 ENCOUNTER — Ambulatory Visit: Payer: No Typology Code available for payment source | Attending: Internal Medicine | Admitting: Physical Therapy

## 2017-01-09 ENCOUNTER — Encounter: Payer: Self-pay | Admitting: Physical Therapy

## 2017-01-09 DIAGNOSIS — M25511 Pain in right shoulder: Secondary | ICD-10-CM | POA: Insufficient documentation

## 2017-01-09 DIAGNOSIS — M25552 Pain in left hip: Secondary | ICD-10-CM | POA: Insufficient documentation

## 2017-01-09 DIAGNOSIS — M546 Pain in thoracic spine: Secondary | ICD-10-CM | POA: Insufficient documentation

## 2017-01-09 DIAGNOSIS — M545 Low back pain: Secondary | ICD-10-CM | POA: Diagnosis present

## 2017-01-09 DIAGNOSIS — M25512 Pain in left shoulder: Secondary | ICD-10-CM | POA: Diagnosis not present

## 2017-01-09 DIAGNOSIS — M542 Cervicalgia: Secondary | ICD-10-CM | POA: Insufficient documentation
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
## 2017-01-14 ENCOUNTER — Encounter: Payer: Self-pay | Admitting: Physical Therapy

## 2017-01-14 ENCOUNTER — Ambulatory Visit: Payer: No Typology Code available for payment source | Admitting: Physical Therapy

## 2017-01-14 DIAGNOSIS — M545 Low back pain, unspecified: Secondary | ICD-10-CM

## 2017-01-14 DIAGNOSIS — M542 Cervicalgia: Secondary | ICD-10-CM

## 2017-01-14 DIAGNOSIS — M25552 Pain in left hip: Secondary | ICD-10-CM

## 2017-01-14 DIAGNOSIS — M25511 Pain in right shoulder: Secondary | ICD-10-CM

## 2017-01-14 DIAGNOSIS — M25512 Pain in left shoulder: Secondary | ICD-10-CM

## 2017-01-14 DIAGNOSIS — M546 Pain in thoracic spine: Secondary | ICD-10-CM
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
## 2017-01-21 ENCOUNTER — Ambulatory Visit (INDEPENDENT_AMBULATORY_CARE_PROVIDER_SITE_OTHER): Payer: Self-pay | Admitting: Internal Medicine

## 2017-01-21 ENCOUNTER — Other Ambulatory Visit: Payer: Self-pay | Admitting: Internal Medicine

## 2017-01-21 VITALS — BP 129/77 | HR 87 | Temp 97.7°F | Ht 65.0 in | Wt 282.3 lb

## 2017-01-21 DIAGNOSIS — L819 Disorder of pigmentation, unspecified: Secondary | ICD-10-CM

## 2017-01-21 DIAGNOSIS — E1121 Type 2 diabetes mellitus with diabetic nephropathy: Secondary | ICD-10-CM

## 2017-01-21 DIAGNOSIS — N941 Unspecified dyspareunia: Secondary | ICD-10-CM

## 2017-01-21 DIAGNOSIS — E1165 Type 2 diabetes mellitus with hyperglycemia: Principal | ICD-10-CM

## 2017-01-21 DIAGNOSIS — M545 Low back pain, unspecified: Secondary | ICD-10-CM

## 2017-01-21 DIAGNOSIS — Z9119 Patient's noncompliance with other medical treatment and regimen: Secondary | ICD-10-CM

## 2017-01-21 DIAGNOSIS — I10 Essential (primary) hypertension: Secondary | ICD-10-CM

## 2017-01-21 DIAGNOSIS — E118 Type 2 diabetes mellitus with unspecified complications: Secondary | ICD-10-CM

## 2017-01-21 DIAGNOSIS — N898 Other specified noninflammatory disorders of vagina: Secondary | ICD-10-CM

## 2017-01-21 DIAGNOSIS — Z87891 Personal history of nicotine dependence: Secondary | ICD-10-CM

## 2017-01-21 DIAGNOSIS — Z79899 Other long term (current) drug therapy: Secondary | ICD-10-CM

## 2017-01-21 DIAGNOSIS — Z8049 Family history of malignant neoplasm of other genital organs: Secondary | ICD-10-CM

## 2017-01-21 DIAGNOSIS — Z7984 Long term (current) use of oral hypoglycemic drugs: Secondary | ICD-10-CM

## 2017-01-21 DIAGNOSIS — IMO0002 Reserved for concepts with insufficient information to code with codable children: Secondary | ICD-10-CM

## 2017-01-21 DIAGNOSIS — Z803 Family history of malignant neoplasm of breast: Secondary | ICD-10-CM

## 2017-01-21 DIAGNOSIS — N915 Oligomenorrhea, unspecified: Secondary | ICD-10-CM

## 2017-01-21 LAB — GLUCOSE, CAPILLARY: Glucose-Capillary: 177 mg/dL — ABNORMAL HIGH (ref 65–99)

## 2017-01-21 LAB — POCT GLYCOSYLATED HEMOGLOBIN (HGB A1C): HEMOGLOBIN A1C: 8.7

## 2017-01-21 MED ORDER — SITAGLIPTIN PHOS-METFORMIN HCL 50-1000 MG PO TABS: 1.0000 | ORAL_TABLET | Freq: Two times a day (BID) | ORAL | 2 refills | Status: DC

## 2017-01-21 MED ORDER — NAPROXEN 500 MG PO TABS: 500.0000 mg | ORAL_TABLET | Freq: Two times a day (BID) | ORAL | 0 refills | Status: DC

## 2017-01-21 NOTE — Progress Notes (Signed)
   CC: For follow-up of her diabetes, hypertension and vaginal dryness.  HPI:  Ms.Pam Jerilynn Mages Eppard is a 46 y.o. lady with past medical history as listed below came to the clinic for follow-up of her diabetes, hypertension and vaginal dryness.  According to patient there is some improvement in her vaginal itching with Replen, she was also complaining of started getting mild dyspareunia and hot flashes. She is having irregular periods, at this time her period comes after 3-6 month. She had a family history of breast cancer in her mother and aunt and uterine cancer in her sister.  She was also complaining of mild intermittent lower abdominal pain, there was no pain during current office visit. She had an history of chronic constipation, for which she uses over-the-counter smooth move tea which helps. She denies any urinary symptoms.  Please see assessment and plan for her chronic problems.  Past Medical History:  Diagnosis Date  . Allergic rhinitis   . Diabetes mellitus   . GERD (gastroesophageal reflux disease)   . Hip pain, bilateral    ? lipomas  . Hypertension   . Irregular menstruation   . Lymphedema    chronic, since age 88  . Mammogram abnormal    06, s/p biopsy of mass and neg work up for neoplasm  . Obesity   . Polyarthropathy    inflammatory  . Right ovarian cyst    s/p resection of r ovary 06  . Wears glasses    Review of Systems:  As per HPI.  Physical Exam:  Vitals:   01/21/17 0945 01/21/17 0952  BP: 129/77   Pulse: 87   Temp: 97.7 F (36.5 C)   TempSrc: Oral   Height:  5\' 5"  (1.651 m)    General: Vital signs reviewed.  Patient is well-developed and well-nourished, in no acute distress and cooperative with exam.  Head: Normocephalic and atraumatic. Eyes: EOMI, conjunctivae normal, no scleral icterus.  Neck: hyperpigmentation covering her neck and extending to upper back and lower face.Supple, trachea midline, normal ROM, no JVD, masses, thyromegaly, or  carotid bruit present.  Cardiovascular: RRR, S1 normal, S2 normal, no murmurs, gallops, or rubs. Pulmonary/Chest: Clear to auscultation bilaterally, no wheezes, rales, or rhonchi. Abdominal: Soft, non-tender, non-distended, BS +, no masses, organomegaly, or guarding present.  Extremities: No lower extremity edema bilaterally,  pulses symmetric and intact bilaterally. No cyanosis or clubbing. Skin: Warm, dry and intact. No rashes or erythema. Psychiatric: Normal mood and affect. speech and behavior is normal. Cognition and memory are normal.  Assessment & Plan:   See Encounters Tab for problem based charting.  Patient discussed with Dr. Angelia Mould.

## 2017-01-21 NOTE — Telephone Encounter (Signed)
Pls call pt back about new Diabetes medicine.  Pt's states," This new medication is $1300.00 and she can not afford it."

## 2017-01-21 NOTE — Assessment & Plan Note (Signed)
Her A1c today was 8.7, elevated from her previous A1c done 8 months ago which was 7.4. According to patient she does not check her blood sugar at home, she was noncompliant with her diet for the last few months stating that she was having many reunions and parties over the summer.She frequently eats from fast food.  Patient might get benefit with Victoza as it can help with weight loss too. This option was discussed with patient and she refused stating that she do not want any needles. She was also very reluctant to make any changes to her medication stating that she will try following her diet more strictly. After discussing with patient we changed her metformin to Janumet 1000-50 twice daily and asked to continue glipizide at 5 mg twice daily. -follow-up in 3 month.

## 2017-01-21 NOTE — Patient Instructions (Addendum)
Thank you for visiting clinic today. As we discussed I'm starting you on a new medicine which is a combination pill for your uncontrolled diabetes called Janumet,please take it as directed. Please watch your diet very carefully. As we also discussed regarding your vaginal dryness, I will not start you on any hormonal replacement therapy because of fewer family history of cancer, try using the Replens more frequently and see if that will help. Please follow-up in 3 month.

## 2017-01-21 NOTE — Assessment & Plan Note (Signed)
BP Readings from Last 3 Encounters:  01/21/17 129/77  12/24/16 (!) 149/76  11/19/16 (!) 147/87   She was normotensive today.  Continue with enalapril 10 mg daily.

## 2017-01-21 NOTE — Assessment & Plan Note (Signed)
Her vaginal itching has improved after taking Diflucan. She continued to feel dryness and mild dyspareunia. She was not regularly using Replens which was advised during previous visit.  She has a strong family history of cancer which includes breast cancer in her mom and Aunt and uterine cancer in her sister.she will not be a good candidate for hormone replacement therapy.Risk and benefit was discussed with the patient and she do not want to increased her risk of cancer.she agreed using Replens regularly and see if it works for her.

## 2017-01-22 NOTE — Telephone Encounter (Signed)
Pt states she has no insurance, medicaid was only family planning nothing else, possibly the IM program?, dr Maudie Mercury will not be back in office until Monday 8/27

## 2017-01-24 ENCOUNTER — Other Ambulatory Visit: Payer: Self-pay | Admitting: Internal Medicine

## 2017-01-24 MED ORDER — METFORMIN HCL 1000 MG PO TABS: 1000.0000 mg | ORAL_TABLET | Freq: Two times a day (BID) | ORAL | 2 refills | Status: DC

## 2017-01-24 NOTE — Telephone Encounter (Signed)
Spoke w/ dr Shan Levans, called in metformin to [facility] with dr Daryll Drown as prescriber

## 2017-01-25 NOTE — Telephone Encounter (Signed)
Agree and discussed with Dr. Shan Levans

## 2017-01-27 NOTE — Progress Notes (Signed)
Internal Medicine Clinic Attending  Case discussed with Dr. Amin at the time of the visit.  We reviewed the resident's history and exam and pertinent patient test results.  I agree with the assessment, diagnosis, and plan of care documented in the resident's note.    

## 2017-04-08 ENCOUNTER — Encounter: Payer: Medicaid Other | Admitting: Internal Medicine

## 2017-04-08 ENCOUNTER — Encounter: Payer: Self-pay | Admitting: Internal Medicine

## 2017-05-03 ENCOUNTER — Other Ambulatory Visit: Payer: Self-pay | Admitting: Internal Medicine

## 2017-05-03 DIAGNOSIS — I10 Essential (primary) hypertension: Secondary | ICD-10-CM

## 2017-06-25 ENCOUNTER — Ambulatory Visit: Payer: Self-pay | Admitting: Internal Medicine

## 2017-06-25 ENCOUNTER — Other Ambulatory Visit: Payer: Self-pay

## 2017-06-25 ENCOUNTER — Ambulatory Visit (HOSPITAL_COMMUNITY)
Admission: RE | Admit: 2017-06-25 | Discharge: 2017-06-25 | Disposition: A | Discharge status: Home / Self Care | Payer: Self-pay | Source: Ambulatory Visit | Attending: Internal Medicine | Admitting: Internal Medicine

## 2017-06-25 VITALS — BP 139/86 | HR 89 | Temp 98.1°F | Wt 279.6 lb

## 2017-06-25 DIAGNOSIS — Z872 Personal history of diseases of the skin and subcutaneous tissue: Secondary | ICD-10-CM

## 2017-06-25 DIAGNOSIS — Z79899 Other long term (current) drug therapy: Secondary | ICD-10-CM

## 2017-06-25 DIAGNOSIS — E1165 Type 2 diabetes mellitus with hyperglycemia: Secondary | ICD-10-CM

## 2017-06-25 DIAGNOSIS — Z7984 Long term (current) use of oral hypoglycemic drugs: Secondary | ICD-10-CM

## 2017-06-25 DIAGNOSIS — Z809 Family history of malignant neoplasm, unspecified: Secondary | ICD-10-CM

## 2017-06-25 DIAGNOSIS — I10 Essential (primary) hypertension: Secondary | ICD-10-CM

## 2017-06-25 DIAGNOSIS — L723 Sebaceous cyst: Secondary | ICD-10-CM

## 2017-06-25 DIAGNOSIS — M542 Cervicalgia: Secondary | ICD-10-CM

## 2017-06-25 DIAGNOSIS — R002 Palpitations: Secondary | ICD-10-CM | POA: Insufficient documentation

## 2017-06-25 DIAGNOSIS — N921 Excessive and frequent menstruation with irregular cycle: Secondary | ICD-10-CM

## 2017-06-25 DIAGNOSIS — K219 Gastro-esophageal reflux disease without esophagitis: Secondary | ICD-10-CM

## 2017-06-25 DIAGNOSIS — N898 Other specified noninflammatory disorders of vagina: Secondary | ICD-10-CM

## 2017-06-25 DIAGNOSIS — E1121 Type 2 diabetes mellitus with diabetic nephropathy: Secondary | ICD-10-CM

## 2017-06-25 DIAGNOSIS — E1140 Type 2 diabetes mellitus with diabetic neuropathy, unspecified: Secondary | ICD-10-CM

## 2017-06-25 DIAGNOSIS — Z87891 Personal history of nicotine dependence: Secondary | ICD-10-CM

## 2017-06-25 DIAGNOSIS — D649 Anemia, unspecified: Secondary | ICD-10-CM

## 2017-06-25 DIAGNOSIS — H5500 Unspecified nystagmus: Secondary | ICD-10-CM

## 2017-06-25 DIAGNOSIS — R42 Dizziness and giddiness: Secondary | ICD-10-CM | POA: Insufficient documentation

## 2017-06-25 DIAGNOSIS — I890 Lymphedema, not elsewhere classified: Secondary | ICD-10-CM

## 2017-06-25 DIAGNOSIS — IMO0002 Reserved for concepts with insufficient information to code with codable children: Secondary | ICD-10-CM

## 2017-06-25 HISTORY — DX: Cervicalgia: M54.2

## 2017-06-25 HISTORY — DX: Dizziness and giddiness: R42

## 2017-06-25 LAB — GLUCOSE, CAPILLARY: GLUCOSE-CAPILLARY: 128 mg/dL — AB (ref 65–99)

## 2017-06-25 LAB — POCT GLYCOSYLATED HEMOGLOBIN (HGB A1C): Hemoglobin A1C: 8

## 2017-06-25 MED ORDER — ENALAPRIL MALEATE 10 MG PO TABS: 10.0000 mg | ORAL_TABLET | Freq: Every day | ORAL | 0 refills | Status: DC

## 2017-06-25 MED ORDER — NAPROXEN 500 MG PO TABS: 500.0000 mg | ORAL_TABLET | Freq: Two times a day (BID) | ORAL | 0 refills | Status: DC

## 2017-06-25 MED ORDER — GLIPIZIDE 5 MG PO TABS: 5.0000 mg | ORAL_TABLET | Freq: Two times a day (BID) | ORAL | 1 refills | Status: DC

## 2017-06-25 MED ORDER — METFORMIN HCL 1000 MG PO TABS: 1000.0000 mg | ORAL_TABLET | Freq: Two times a day (BID) | ORAL | 2 refills | Status: DC

## 2017-06-25 NOTE — Assessment & Plan Note (Signed)
Well-controlled  Plan --Refill enalapril 10 mg daily

## 2017-06-25 NOTE — Progress Notes (Signed)
CC: neck pain  HPI:  [name] is a 47 y.o. with a PMH of T2DM, lymphedema, GERD, HTN, presenting to clinic for dizziness, right sided neck pain, neck cyst, and palpitations.  Dizziness Palpitations Patient reports several month history of mild dizziness and weakness specifically after eating anything canned. She also reports one-day history of different dizziness where she feels like she is drunk especially when changing positions like up from sitting down. She denies head injury, unsteady gait, focal weakness, focal numbness, dysphasia, dysarthria, headaches, vision changes. She does have also several month history of palpitations which occur several times a day. She does not think these are related to her feelings of dizziness. She denies chest pain. She denies feeling as if she is about to pass out during episodes of palpitations; palpitations occur at rest and with activity; palpitations have never woken her up at night. She denies polyuria, polydipsia, nausea, vomiting. She denies taking sedating medications; she does take gabapentin for diabetic neuropathy however this is only about once a week.   Right-sided neck pain Patient reports several month history of right-sided neck pain. She says she was given physical therapy for this but it did not help. She denies neck trauma, arm weakness, arm tingling, radiation of pain. She has not tried heat, Tylenol, ibuprofen.  History of sebaceous cyst Patient with history of sebaceous cyst which was previously infected on her posterior neck that is status post excision by general surgery (Dr. Ninfa Linden). She states she's felt that has flared up from time to time that Dr. Rush Farmer have told her to come back if this occurred. She denies fevers, current drainage. She does endorse some tenderness in pain every once in a while when area does get bigger.   Please see problem based Assessment and Plan for status of patients chronic  conditions.  Past Medical History:  Diagnosis Date  . Allergic rhinitis   . Diabetes mellitus   . GERD (gastroesophageal reflux disease)   . Hip pain, bilateral    ? lipomas  . Hypertension   . Irregular menstruation   . Lymphedema    chronic, since age 59  . Mammogram abnormal    06, s/p biopsy of mass and neg work up for neoplasm  . Obesity   . Polyarthropathy    inflammatory  . Right ovarian cyst    s/p resection of r ovary 06  . Wears glasses     Review of Systems:   ROS Per HPI  Physical Exam:  Vitals:   06/25/17 1602  BP: 139/86  Pulse: 89  Temp: 98.1 F (36.7 C)  TempSrc: Oral  SpO2: 100%  Weight: 279 lb 9.6 oz (126.8 kg)   GENERAL- alert, co-operative, appears as stated age, not in any distress. HEENT- Atraumatic, normocephalic, PERRL, EOMI, oral mucosa appears moist; right eye nystagmus to the right with lateral eye movement; negative head impulse test and negative skew test. Bil TMI intact w/o effusion Point tenderness to palpation of right trap/supraspinatus.  Well healed scar with what feels like subq scar tissue - nontender, no induration, no drainage CARDIAC- RRR, no murmurs, rubs or gallops. RESP- Moving equal volumes of air, and clear to auscultation bilaterally, no wheezes or crackles. ABDOMEN- Soft, nontender, bowel sounds present. NEURO- CN 2-12 intact; strength and sensation intact throughout, no gait abnormality. EXTREMITIES- pulse 2+ PT, symmetric. Chronic, stable bil LE edema (lymphedema) SKIN- Warm, dry. PSYCH- Normal mood and affect, appropriate thought content and speech.  Assessment & Plan:  See Encounters Tab for problem based charting.   Patient discussed with Dr. Gerrit Friends, MD Internal Medicine PGY2

## 2017-06-25 NOTE — Patient Instructions (Signed)
FOLLOW-UP INSTRUCTIONS When: 1-2 month For: dizziness, diabetes What to bring: your medicines  For your neck pain, use heat 3-4 times per day to help relax the muscle and do light neck stretches. You can use tylenol or ibuprofen on days that it is not relieved with pain.  For your cyst, I will place a referral to Dr. Ninfa Linden.  For your dizziness and heart fluttering, I am checking some labs and have placed an order for a 2 day heart monitor to check for any irregular heart rhythms.

## 2017-06-26 ENCOUNTER — Encounter: Payer: Self-pay | Admitting: Internal Medicine

## 2017-06-26 DIAGNOSIS — D500 Iron deficiency anemia secondary to blood loss (chronic): Secondary | ICD-10-CM | POA: Insufficient documentation

## 2017-06-26 LAB — BMP8+ANION GAP
Anion Gap: 15 mmol/L (ref 10.0–18.0)
BUN/Creatinine Ratio: 10 (ref 9–23)
BUN: 6 mg/dL (ref 6–24)
CALCIUM: 8.8 mg/dL (ref 8.7–10.2)
CHLORIDE: 104 mmol/L (ref 96–106)
CO2: 23 mmol/L (ref 20–29)
Creatinine, Ser: 0.58 mg/dL (ref 0.57–1.00)
GFR, EST AFRICAN AMERICAN: 126 mL/min/{1.73_m2} (ref >59)
GFR, EST NON AFRICAN AMERICAN: 109 mL/min/{1.73_m2} (ref >59)
Glucose: 135 mg/dL — ABNORMAL HIGH (ref 65–99)
Potassium: 4.4 mmol/L (ref 3.5–5.2)
Sodium: 142 mmol/L (ref 134–144)

## 2017-06-26 LAB — CBC
HEMATOCRIT: 34.9 % (ref 34.0–46.6)
HEMOGLOBIN: 10.9 g/dL — AB (ref 11.1–15.9)
MCH: 26.9 pg (ref 26.6–33.0)
MCHC: 31.2 g/dL — AB (ref 31.5–35.7)
MCV: 86 fL (ref 79–97)
Platelets: 304 10*3/uL (ref 150–379)
RBC: 4.05 x10E6/uL (ref 3.77–5.28)
RDW: 16.7 % — ABNORMAL HIGH (ref 12.3–15.4)
WBC: 8.5 10*3/uL (ref 3.4–10.8)

## 2017-06-26 LAB — TSH: TSH: 2.31 u[IU]/mL (ref 0.450–4.500)

## 2017-06-26 NOTE — Assessment & Plan Note (Signed)
Patient reports several month history of right-sided neck pain. She says she was given physical therapy for this but it did not help. She denies neck trauma, arm weakness, arm tingling, radiation of pain. She has not tried heat, Tylenol, ibuprofen.  Exam reveals point tenderness of trap/supraspinatous mm. This is likely from muscle spasm; no shoulder tenderness.  Plan: --advised frequent use of heat to area as well as tylenol/ibuprofen if heat not enough to control symptoms

## 2017-06-26 NOTE — Assessment & Plan Note (Signed)
Patient reports several month history of mild dizziness and weakness specifically after eating anything canned. She also reports one-day history of different dizziness where she feels like she is drunk especially when changing positions like up from sitting down. She denies head injury, unsteady gait, focal weakness, focal numbness, dysphasia, dysarthria, headaches, vision changes. She does have also several month history of palpitations which occur several times a day. She does not think these are related to her feelings of dizziness. She denies chest pain. She denies feeling as if she is about to pass out during episodes of palpitations; palpitations occur at rest and with activity; palpitations have never woken her up at night. She denies polyuria, polydipsia, nausea, vomiting. She denies taking sedating medications; she does take gabapentin for diabetic neuropathy however this is only about once a week.  Orthostatic vital signs in office were negative.  Neuro exam was not concerning for CVA. Not taking sedative medications frequently enough to be implicated in her dizziness.  Plan: --A1c - 8.0; likely not hyper or hypoglycemia induced dizziness --EKG NSR --halter monitor ordered --if no etiology found on halter monitor, and symptoms not improving, can send for vestibular PT

## 2017-06-26 NOTE — Assessment & Plan Note (Signed)
Patient with several month h/o palpitations multiple times a day with no associated symptoms, but also dizziness which she states occurs at different times.  Plan: --EKG and rhythm strip - personally reviewed - NSR, no aberrant  Beats, no ischemic changes --CBC - Hgb 10.9 from 12.2 one year prior; would not expect this level of anemia to be causing palpitations --Bmet - renal function and electrolytes wnl --TSH wnl --referral placed for 48hr halter monitor --if halter monitor negative for arrhythmias, could consider anxiety as etiology as symptoms only occur during waking hours.

## 2017-06-26 NOTE — Assessment & Plan Note (Signed)
A1c 8.0 today - improved. Patient endorses compliance with metformin 1000mg  BID and glipizide 5mg  BID. Wanted to work on diet as she had not kept up with it during the holiday season.  Plan: --refills provided for metformin and glipizide --f/u in 3 month for repeat A1c

## 2017-06-26 NOTE — Progress Notes (Signed)
Internal Medicine Clinic Attending  Case discussed with Dr. Svalina  at the time of the visit.  We reviewed the resident's history and exam and pertinent patient test results.  I agree with the assessment, diagnosis, and plan of care documented in the resident's note.  

## 2017-06-26 NOTE — Assessment & Plan Note (Signed)
New noted on CBC with Hgb to 10.9 from 12.2 one year ago. Normal MCV and elevated RDW. This could be related to early iron deficiency, folate or B12 deficiency. Patient endorses irregular, heavy periods about once every 3 months. She denies hematochezia or melena.  Plan: --add on ferritin, iron, TIBC if enough blood available --patient to f/u in 1 month for further eval if above unremarkable or unable to be obtained, and pap smear

## 2017-06-26 NOTE — Assessment & Plan Note (Signed)
On calling patient with her lab results she stated that she takes OTC Azo yeast formulation daily to prevent yeast infections as she states she gets them if she stops. Symptoms are vaginal itching, dryness. On last speculum exam and wet prep she was noted to have vaginal dryness and wet prep was negative for bacterial and yeast causes of discomfort. She was recommended to start a vaginal lubricant due to significant cancer hx in her female family members and risk of use of estrogen replacement.  Azo yeast formulation contains candida albicans colonies, Kreosotum (distilled wood tar), natrium muritican (~salt), and sulfur; from prelim search no studies on long term use.  Plan: --advised patient to stop taking daily --advised to take plain greek yogurt and use vaginal lubricants as previously discussed --patient to f/u in 1 month for pap (cervical os was not able to be found during previous speculum exam).

## 2017-06-27 LAB — IRON AND TIBC
IRON SATURATION: 10 % — AB (ref 15–55)
Iron: 35 ug/dL (ref 27–159)
TIBC: 337 ug/dL (ref 250–450)
UIBC: 302 ug/dL (ref 131–425)

## 2017-06-27 LAB — FERRITIN: FERRITIN: 28 ng/mL (ref 15–150)

## 2017-06-27 LAB — SPECIMEN STATUS REPORT

## 2017-07-10 ENCOUNTER — Telehealth: Payer: Self-pay | Admitting: *Deleted

## 2017-07-10 NOTE — Telephone Encounter (Signed)
SPOKE WITH PATIENT REGARDING HER GENERAL  SURGERY REFERRAL, AND HER INSURANCE COVERAGE. PATIENT STATES SHE SHOULD KNOW SOMETHING IN ABOUT 2 WEEKS, AND TO HOLD HER REFERRAL AND SHE WILL CONTACT OUR OFFICE WHEN SHE GET COVERAGE.

## 2017-07-24 ENCOUNTER — Telehealth: Payer: Self-pay | Admitting: *Deleted

## 2017-07-24 NOTE — Telephone Encounter (Signed)
SPOKE WITH PATIENT REGARDING HER GENERAL SURGERY. PATIENT STATES SHE HAS NOT YET GOTTEN INSURANCE COVERAGE. SHE IS STILL LOOKING FOR INSURANCE COVERAGE THAT THEY CAN AFFORD. SHE STATES SHE WILL GIVE OUR OFFICE A CALL WHEN EVER THEY GET COVERAGE. REQUESTING THAT WE PUT HER REFERRAL ON  HOLD UNTIL THEN.

## 2017-07-29 NOTE — Addendum Note (Signed)
Addended by: Hulan Fray on: 07/29/2017 04:22 PM   Modules accepted: Orders

## 2017-07-31 ENCOUNTER — Encounter: Payer: Self-pay | Admitting: Internal Medicine

## 2017-08-16 ENCOUNTER — Encounter: Payer: Self-pay | Admitting: Internal Medicine

## 2017-08-16 ENCOUNTER — Ambulatory Visit: Payer: Self-pay

## 2017-09-05 ENCOUNTER — Other Ambulatory Visit: Payer: Self-pay | Admitting: Internal Medicine

## 2017-09-05 DIAGNOSIS — E1165 Type 2 diabetes mellitus with hyperglycemia: Principal | ICD-10-CM

## 2017-09-05 DIAGNOSIS — E1121 Type 2 diabetes mellitus with diabetic nephropathy: Secondary | ICD-10-CM

## 2017-09-05 DIAGNOSIS — I10 Essential (primary) hypertension: Secondary | ICD-10-CM

## 2017-09-05 DIAGNOSIS — IMO0002 Reserved for concepts with insufficient information to code with codable children: Secondary | ICD-10-CM

## 2017-09-05 NOTE — Telephone Encounter (Signed)
refilled 

## 2018-03-19 LAB — GLUCOSE, POCT (MANUAL RESULT ENTRY): POC Glucose: 196 mg/dl — AB (ref 70–99)

## 2018-04-04 ENCOUNTER — Other Ambulatory Visit: Payer: Self-pay

## 2018-04-04 DIAGNOSIS — E1165 Type 2 diabetes mellitus with hyperglycemia: Principal | ICD-10-CM

## 2018-04-04 DIAGNOSIS — I10 Essential (primary) hypertension: Secondary | ICD-10-CM

## 2018-04-04 DIAGNOSIS — E1121 Type 2 diabetes mellitus with diabetic nephropathy: Secondary | ICD-10-CM

## 2018-04-04 DIAGNOSIS — IMO0002 Reserved for concepts with insufficient information to code with codable children: Secondary | ICD-10-CM

## 2018-04-04 NOTE — Telephone Encounter (Signed)
glipiZIDE (GLUCOTROL) 5 MG tablet,  enalapril (VASOTEC) 10 MG tablet,  metFORMIN (GLUCOPHAGE) 1000 MG tablet, REFILL REQUEST @  [facility] [address] [place] [phone] (Phone) [phone] (Fax)

## 2018-04-05 ENCOUNTER — Other Ambulatory Visit: Payer: Self-pay | Admitting: Internal Medicine

## 2018-04-05 DIAGNOSIS — I10 Essential (primary) hypertension: Secondary | ICD-10-CM

## 2018-04-06 MED ORDER — ENALAPRIL MALEATE 10 MG PO TABS: 10.0000 mg | ORAL_TABLET | Freq: Every day | ORAL | 0 refills | Status: DC

## 2018-04-06 MED ORDER — METFORMIN HCL 1000 MG PO TABS: 1000.0000 mg | ORAL_TABLET | Freq: Two times a day (BID) | ORAL | 0 refills | Status: DC

## 2018-04-06 MED ORDER — GLIPIZIDE 5 MG PO TABS: 5.0000 mg | ORAL_TABLET | Freq: Two times a day (BID) | ORAL | 0 refills | Status: DC

## 2018-04-06 NOTE — Telephone Encounter (Signed)
refilled 

## 2018-04-08 NOTE — Telephone Encounter (Signed)
Already ordered on Sunday

## 2018-04-11 ENCOUNTER — Ambulatory Visit: Payer: Medicaid Other

## 2018-04-21 ENCOUNTER — Ambulatory Visit (INDEPENDENT_AMBULATORY_CARE_PROVIDER_SITE_OTHER): Payer: Self-pay | Admitting: Internal Medicine

## 2018-04-21 ENCOUNTER — Encounter (INDEPENDENT_AMBULATORY_CARE_PROVIDER_SITE_OTHER): Payer: Self-pay

## 2018-04-21 ENCOUNTER — Encounter: Payer: Self-pay | Admitting: Internal Medicine

## 2018-04-21 VITALS — BP 142/79 | HR 89 | Temp 98.6°F | Wt 286.4 lb

## 2018-04-21 DIAGNOSIS — E785 Hyperlipidemia, unspecified: Secondary | ICD-10-CM

## 2018-04-21 DIAGNOSIS — Z7984 Long term (current) use of oral hypoglycemic drugs: Secondary | ICD-10-CM

## 2018-04-21 DIAGNOSIS — E118 Type 2 diabetes mellitus with unspecified complications: Secondary | ICD-10-CM

## 2018-04-21 DIAGNOSIS — Z79899 Other long term (current) drug therapy: Secondary | ICD-10-CM

## 2018-04-21 DIAGNOSIS — E669 Obesity, unspecified: Secondary | ICD-10-CM

## 2018-04-21 DIAGNOSIS — N6320 Unspecified lump in the left breast, unspecified quadrant: Secondary | ICD-10-CM

## 2018-04-21 DIAGNOSIS — Z87891 Personal history of nicotine dependence: Secondary | ICD-10-CM

## 2018-04-21 DIAGNOSIS — I10 Essential (primary) hypertension: Secondary | ICD-10-CM

## 2018-04-21 DIAGNOSIS — IMO0001 Reserved for inherently not codable concepts without codable children: Secondary | ICD-10-CM

## 2018-04-21 DIAGNOSIS — E1165 Type 2 diabetes mellitus with hyperglycemia: Secondary | ICD-10-CM

## 2018-04-21 DIAGNOSIS — Z6841 Body Mass Index (BMI) 40.0 and over, adult: Secondary | ICD-10-CM

## 2018-04-21 DIAGNOSIS — D500 Iron deficiency anemia secondary to blood loss (chronic): Secondary | ICD-10-CM

## 2018-04-21 LAB — GLUCOSE, CAPILLARY: Glucose-Capillary: 105 mg/dL — ABNORMAL HIGH (ref 70–99)

## 2018-04-21 LAB — POCT GLYCOSYLATED HEMOGLOBIN (HGB A1C): HEMOGLOBIN A1C: 7.1 % — AB (ref 4.0–5.6)

## 2018-04-21 MED ORDER — ROSUVASTATIN CALCIUM 10 MG PO TABS: 10.0000 mg | ORAL_TABLET | Freq: Every day | ORAL | 11 refills | Status: DC

## 2018-04-21 MED ORDER — ENALAPRIL MALEATE 20 MG PO TABS: 20.0000 mg | ORAL_TABLET | Freq: Every day | ORAL | 3 refills | Status: DC

## 2018-04-21 MED ORDER — GABAPENTIN 300 MG PO CAPS: 300.0000 mg | ORAL_CAPSULE | Freq: Every evening | ORAL | 5 refills | Status: DC | PRN

## 2018-04-21 MED ORDER — FERROUS GLUCONATE 324 (38 FE) MG PO TABS: 324.0000 mg | ORAL_TABLET | Freq: Every day | ORAL | 3 refills | Status: DC

## 2018-04-21 MED FILL — ROSUVASTATIN CALCIUM 10 MG: 10 | 30 days supply | Qty: 30 | Fill #0

## 2018-04-21 MED FILL — ENALAPRIL MALEATE 20 MG TAB: 20 | 30 days supply | Qty: 30 | Fill #0

## 2018-04-21 NOTE — Patient Instructions (Signed)
Sharon Fisher, we have started you on crestor 10mg  daily for your cholesterol.  We also increased your enalapril to 20mg  daily for your high blood pressure.  I have started you on iron for your iron deficiency anemia.  I have sent for a mammogram to assess your breast lump.  Keep up the good work with your diabetes.  I will see you in 3 months.

## 2018-04-21 NOTE — Progress Notes (Signed)
CC: Breast lymphadenopathy, iron deficiency anemia, T2DM, HLD  HPI:  [name] is a 47 y.o. female with PMH below.  Today we will address Breast lymphadenopathy, iron deficiency anemia, T2DM, HLD   Please see A&P for status of the patient's chronic medical conditions  Past Medical History:  Diagnosis Date  . Allergic rhinitis   . Diabetes mellitus   . Dizziness 06/25/2017  . Fatigue 04/15/2015  . FRACTURE, CLAVICLE, RIGHT 11/22/2008   Qualifier: Diagnosis of  By: Alfonse Alpers MD, Audelia Hives    . GERD (gastroesophageal reflux disease)   . Hip pain, bilateral    ? lipomas  . HIP PAIN, BILATERAL 04/05/2006   Annotation: since 1999 Qualifier: Diagnosis of  By: Conception Chancy MD, Hinton Dyer    . Hypertension   . Irregular menstruation   . Left-sided back pain 04/15/2015  . Lipoma of other skin and subcutaneous tissue 05/05/2008   Qualifier: Diagnosis of  By: Alfonse Alpers MD, Audelia Hives    . Lymphedema    chronic, since age 39  . Mammogram abnormal    06, s/p biopsy of mass and neg work up for neoplasm  . Neck pain on right side 06/25/2017  . Need for prophylactic vaccination with combined diphtheria-tetanus-pertussis (DTP) vaccine 07/15/2012  . Obesity   . Polyarthropathy    inflammatory  . Right ovarian cyst    s/p resection of r ovary 06  . RLQ PAIN 03/15/2009   Qualifier: Diagnosis of  By: Eyvonne Mechanic MD, Vijay    . Sebaceous cyst 07/15/2012  . Shoulder pain 07/26/2015  . Vaginal itching 12/27/2016  . Wears glasses    Review of Systems:  ROS: Pulmonary: pt denies increased work of breathing, shortness of breath,  Cardiac: pt denies palpitations, chest pain,  Abdominal: pt denies abdominal pain, nausea, vomiting, or diarrhea  Physical Exam:  Vitals:   04/21/18 1443 04/21/18 1547  BP: (!) 156/94 (!) 142/79  Pulse: 91 89  Temp: 98.6 F (37 C)   TempSrc: Oral   SpO2: 100%   Weight: 286 lb 6.4 oz (129.9 kg)    Cardiac: normal rate and rhythm, clear s1 and s2, no murmurs, rubs or  gallops Pulmonary: CTAB, not in distress Abdominal: non distended abdomen, soft and nontender Extremities: no LE edema Psych: Alert, conversant, in good spirits  Social History   Socioeconomic History  . Marital status: Married    Spouse name: Not on file  . Number of children: Not on file  . Years of education: 104  . Highest education level: Not on file  Occupational History  . Not on file  Social Needs  . Financial resource strain: Not on file  . Food insecurity:    Worry: Not on file    Inability: Not on file  . Transportation needs:    Medical: Not on file    Non-medical: Not on file  Tobacco Use  . Smoking status: Former Research scientist (life sciences)  . Smokeless tobacco: Former Systems developer  . Tobacco comment: Quit 1995.  Substance and Sexual Activity  . Alcohol use: No    Alcohol/week: 0.0 standard drinks  . Drug use: No  . Sexual activity: Not on file  Lifestyle  . Physical activity:    Days per week: Not on file    Minutes per session: Not on file  . Stress: Not on file  Relationships  . Social connections:    Talks on phone: Not on file    Gets together: Not on file    Attends religious service:  Not on file    Active member of club or organization: Not on file    Attends meetings of clubs or organizations: Not on file    Relationship status: Not on file  . Intimate partner violence:    Fear of current or ex partner: Not on file    Emotionally abused: Not on file    Physically abused: Not on file    Forced sexual activity: Not on file  Other Topics Concern  . Not on file  Social History Narrative   Works as a Building control surveyor.   Lives at home with husband and daughter.    Family History  Problem Relation Age of Onset  . Hypertension Mother   . Cancer Mother        breast cancer  . Anemia Mother   . Sickle cell trait Sister   . Cancer Maternal Uncle        breast cancer  . Cancer Paternal Grandmother        lung cancer  . Hypertension Paternal Grandmother   . Diabetes Paternal  Grandmother     Assessment & Plan:   See Encounters Tab for problem based charting.  Patient discussed with Dr. Daryll Drown

## 2018-04-22 ENCOUNTER — Encounter: Payer: Self-pay | Admitting: Internal Medicine

## 2018-04-22 NOTE — Assessment & Plan Note (Signed)
Iron deficiency anemia:  At her last visit to [facility] patient was found to be iron deficient.  Not started on iron replacement at that time.  Her LNMP was one month ago, she noted heavy bleeding occurring over one week.  Her periods have been every 2-3 months for the last two years.  Sounds like she may have PCOS.  Used to follow with OBGYN but has not in the last few years.  Had a leiomyomectomy procedure in 2006 but had residual fibroids left over.   -CBC shows hgb 11.1, ferritin <50 consistent with iron deficiency -start pt on ferrous gluconate 324mg  daily

## 2018-04-22 NOTE — Assessment & Plan Note (Signed)
Pt diabetic, obese with HTN.  Her last LDL 2 years ago was 70.    -will start pt on crestor 10mg   -lipid panel today

## 2018-04-22 NOTE — Assessment & Plan Note (Signed)
Patient reports bilateral increased lymphadenopathy, she is supposed to have yearly mammograms due to an abnormal mammo with follow up biopsy for lymphadenopathy which came back benign.  She has not had a mammogram in over 4 years now.    -order diagnostic mammo bilateral

## 2018-04-22 NOTE — Assessment & Plan Note (Signed)
BP Readings from Last 3 Encounters:  04/21/18 (!) 142/79  03/19/18 135/81  06/25/17 139/86   BP elevated today on repeat measurement.    -will increase enalapril to 20mg  daily

## 2018-04-22 NOTE — Assessment & Plan Note (Signed)
  Lab Results  Component Value Date   HGBA1C 7.1 (A) 04/21/2018   Currently on metformin 1000mg  BID and glipizide 5mg  BID.  No insurance and no orange card.    -A1C today 7.1 will continue current therapy

## 2018-04-23 LAB — LIPID PANEL
CHOL/HDL RATIO: 4.2 ratio (ref 0.0–4.4)
CHOLESTEROL TOTAL: 143 mg/dL (ref 100–199)
HDL: 34 mg/dL — ABNORMAL LOW (ref >39)
LDL Calculated: 81 mg/dL (ref 0–99)
Triglycerides: 140 mg/dL (ref 0–149)
VLDL Cholesterol Cal: 28 mg/dL (ref 5–40)

## 2018-04-23 LAB — CBC
Hematocrit: 34.1 % (ref 34.0–46.6)
Hemoglobin: 11.1 g/dL (ref 11.1–15.9)
MCH: 27.5 pg (ref 26.6–33.0)
MCHC: 32.6 g/dL (ref 31.5–35.7)
MCV: 84 fL (ref 79–97)
PLATELETS: 352 10*3/uL (ref 150–450)
RBC: 4.04 x10E6/uL (ref 3.77–5.28)
RDW: 14.8 % (ref 12.3–15.4)
WBC: 8.1 10*3/uL (ref 3.4–10.8)

## 2018-04-23 LAB — BMP8+ANION GAP
ANION GAP: 17 mmol/L (ref 10.0–18.0)
BUN/Creatinine Ratio: 11 (ref 9–23)
BUN: 7 mg/dL (ref 6–24)
CHLORIDE: 103 mmol/L (ref 96–106)
CO2: 18 mmol/L — AB (ref 20–29)
Calcium: 8.9 mg/dL (ref 8.7–10.2)
Creatinine, Ser: 0.61 mg/dL (ref 0.57–1.00)
GFR calc Af Amer: 123 mL/min/{1.73_m2} (ref >59)
GFR calc non Af Amer: 107 mL/min/{1.73_m2} (ref >59)
GLUCOSE: 88 mg/dL (ref 65–99)
Potassium: 3.9 mmol/L (ref 3.5–5.2)
SODIUM: 138 mmol/L (ref 134–144)

## 2018-04-23 LAB — FERRITIN: Ferritin: 25 ng/mL (ref 15–150)

## 2018-04-23 NOTE — Progress Notes (Signed)
Internal Medicine Clinic Attending  Case discussed with Dr. Winfrey  at the time of the visit.  We reviewed the resident's history and exam and pertinent patient test results.  I agree with the assessment, diagnosis, and plan of care documented in the resident's note.  

## 2018-04-28 ENCOUNTER — Other Ambulatory Visit: Payer: Self-pay | Admitting: Internal Medicine

## 2018-04-28 ENCOUNTER — Telehealth: Payer: Self-pay | Admitting: Internal Medicine

## 2018-04-28 DIAGNOSIS — N6320 Unspecified lump in the left breast, unspecified quadrant: Secondary | ICD-10-CM

## 2018-04-28 NOTE — Telephone Encounter (Signed)
pt doing well on new medications, went over results, she is taking iron as we discussed

## 2018-05-14 ENCOUNTER — Other Ambulatory Visit: Payer: Medicaid Other

## 2018-05-15 ENCOUNTER — Telehealth: Payer: Self-pay | Admitting: Internal Medicine

## 2018-05-26 MED FILL — ENALAPRIL MALEATE 20 MG TAB: 20 | 30 days supply | Qty: 30 | Fill #1

## 2018-05-26 MED FILL — ROSUVASTATIN CALCIUM 10 MG: 10 | 30 days supply | Qty: 30 | Fill #1

## 2018-06-17 ENCOUNTER — Encounter: Payer: Self-pay | Admitting: Internal Medicine

## 2018-06-27 MED FILL — ENALAPRIL MALEATE 20 MG TAB: 20 | 30 days supply | Qty: 30 | Fill #2

## 2018-06-27 MED FILL — ROSUVASTATIN CALCIUM 10 MG: 10 | 30 days supply | Qty: 30 | Fill #2

## 2018-07-01 ENCOUNTER — Other Ambulatory Visit: Payer: Self-pay | Admitting: *Deleted

## 2018-07-01 DIAGNOSIS — E1165 Type 2 diabetes mellitus with hyperglycemia: Principal | ICD-10-CM

## 2018-07-01 DIAGNOSIS — E1121 Type 2 diabetes mellitus with diabetic nephropathy: Secondary | ICD-10-CM

## 2018-07-01 DIAGNOSIS — IMO0002 Reserved for concepts with insufficient information to code with codable children: Secondary | ICD-10-CM

## 2018-07-02 ENCOUNTER — Other Ambulatory Visit: Payer: Self-pay | Admitting: *Deleted

## 2018-07-02 DIAGNOSIS — E1165 Type 2 diabetes mellitus with hyperglycemia: Principal | ICD-10-CM

## 2018-07-02 DIAGNOSIS — E1121 Type 2 diabetes mellitus with diabetic nephropathy: Secondary | ICD-10-CM

## 2018-07-02 DIAGNOSIS — IMO0002 Reserved for concepts with insufficient information to code with codable children: Secondary | ICD-10-CM

## 2018-07-02 MED ORDER — METFORMIN HCL 1000 MG PO TABS: 1000.0000 mg | ORAL_TABLET | Freq: Two times a day (BID) | ORAL | 0 refills | Status: DC

## 2018-07-02 MED ORDER — GLIPIZIDE 5 MG PO TABS: 5.0000 mg | ORAL_TABLET | Freq: Two times a day (BID) | ORAL | 0 refills | Status: DC

## 2018-07-02 NOTE — Telephone Encounter (Signed)
HAS BEEN REQUESTED

## 2018-07-02 NOTE — Telephone Encounter (Signed)
refilled 

## 2018-07-03 MED FILL — glipiZIDE 5 MG TABS: 5 | 30 days supply | Qty: 60 | Fill #0

## 2018-07-03 MED FILL — metFORMIN HCL 1000 MG TABS: 1000 | 30 days supply | Qty: 60 | Fill #0

## 2018-07-28 ENCOUNTER — Other Ambulatory Visit: Payer: Self-pay

## 2018-07-28 ENCOUNTER — Encounter: Payer: Medicaid Other | Admitting: Internal Medicine

## 2018-07-28 DIAGNOSIS — IMO0002 Reserved for concepts with insufficient information to code with codable children: Secondary | ICD-10-CM

## 2018-07-28 DIAGNOSIS — E785 Hyperlipidemia, unspecified: Secondary | ICD-10-CM

## 2018-07-28 DIAGNOSIS — E1165 Type 2 diabetes mellitus with hyperglycemia: Principal | ICD-10-CM

## 2018-07-28 DIAGNOSIS — E1121 Type 2 diabetes mellitus with diabetic nephropathy: Secondary | ICD-10-CM

## 2018-07-28 NOTE — Telephone Encounter (Signed)
Called pt - stated the last time she saw her doctor only 2 meds were sent to [facility]. ANd she's out of Glipizide and Metformin. Also include "IM Program" if applicable. Thanks

## 2018-07-28 NOTE — Telephone Encounter (Signed)
Requesting all meds to be filled @ outpatient pharmacy. Please call back.

## 2018-07-29 MED ORDER — METFORMIN HCL 1000 MG PO TABS: 1000.0000 mg | ORAL_TABLET | Freq: Two times a day (BID) | ORAL | 6 refills | Status: DC

## 2018-07-29 MED ORDER — GLIPIZIDE 5 MG PO TABS: 5.0000 mg | ORAL_TABLET | Freq: Two times a day (BID) | ORAL | 11 refills | Status: DC

## 2018-07-29 MED FILL — metFORMIN HCL 1000 MG TABS: 1000 | 30 days supply | Qty: 60 | Fill #0 | Status: TO

## 2018-07-29 MED FILL — glipiZIDE 5 MG TABS: 5 | 30 days supply | Qty: 60 | Fill #0 | Status: TO

## 2018-07-29 NOTE — Telephone Encounter (Signed)
refilled 

## 2018-08-01 ENCOUNTER — Other Ambulatory Visit: Payer: Self-pay | Admitting: Internal Medicine

## 2018-08-01 DIAGNOSIS — IMO0001 Reserved for inherently not codable concepts without codable children: Secondary | ICD-10-CM

## 2018-08-01 DIAGNOSIS — E1165 Type 2 diabetes mellitus with hyperglycemia: Secondary | ICD-10-CM

## 2018-08-01 DIAGNOSIS — E1142 Type 2 diabetes mellitus with diabetic polyneuropathy: Secondary | ICD-10-CM

## 2018-08-01 DIAGNOSIS — M545 Low back pain, unspecified: Secondary | ICD-10-CM

## 2018-08-01 DIAGNOSIS — M542 Cervicalgia: Secondary | ICD-10-CM

## 2018-08-01 NOTE — Telephone Encounter (Signed)
Pt needs refill on metFORMIN (GLUCOPHAGE) 1000 MG tablet glipiZIDE (GLUCOTROL) 5 MG tablet  naproxen (NAPROSYN) 500 MG tablet  ; pt contact [facility] [place], [place] - [address] [facility].

## 2018-08-02 MED FILL — ENALAPRIL MALEATE 20 MG TAB: 20 | 30 days supply | Qty: 30 | Fill #3

## 2018-08-04 MED ORDER — NAPROXEN 500 MG PO TABS: 500.0000 mg | ORAL_TABLET | Freq: Two times a day (BID) | ORAL | 0 refills | Status: DC

## 2018-08-04 MED FILL — NAPROXEN 500 MG TABLET: 500 | 30 days supply | Qty: 60 | Fill #0

## 2018-08-04 NOTE — Telephone Encounter (Addendum)
I apologize, Pt states last rx was sent to [facility] will call to cancel rx. Please send new rx to [place] if appropriate. Thank you.Despina Hidden Cassady3/2/20202:34 PM

## 2018-08-04 NOTE — Telephone Encounter (Signed)
Appears too early for gabapentin refill

## 2018-08-04 NOTE — Telephone Encounter (Signed)
Metformin last filled 2/25 Glipizide last filled 2/25 Spoke to patient-she is aware of metformin/glipizide refills, and she is now requesting naproxen and gabapentin refills sent to [facility].  Will send refill request to MD for review.Despina Hidden Cassady3/2/20201:54 PM

## 2018-08-05 MED ORDER — GABAPENTIN 300 MG PO CAPS: 300.0000 mg | ORAL_CAPSULE | Freq: Every evening | ORAL | 5 refills | Status: DC | PRN

## 2018-08-05 MED FILL — GABAPENTIN 300 MG CAPSULE: 300 | 30 days supply | Qty: 30 | Fill #0 | Status: TO

## 2018-08-05 NOTE — Telephone Encounter (Signed)
Gabapentin also sent to [facility].

## 2018-08-05 NOTE — Addendum Note (Signed)
Addended by: Oval Linsey D on: 08/05/2018 09:39 AM   Modules accepted: Orders

## 2018-08-15 ENCOUNTER — Telehealth: Payer: Self-pay | Admitting: *Deleted

## 2018-08-15 NOTE — Telephone Encounter (Signed)
RETURNED [place] / SHE NOW HAS INSURANCE COVERAGE. PATIENT IS TO CALL AND MAKE APPOINTMENT FOR REFERRAL ONLY VISIT WITH PCP FOR [facility].

## 2018-08-18 MED FILL — ROSUVASTATIN CALCIUM 10 MG: 10 | 30 days supply | Qty: 30 | Fill #3 | Status: TO

## 2018-08-20 ENCOUNTER — Encounter: Payer: Self-pay | Admitting: Internal Medicine

## 2018-08-20 ENCOUNTER — Ambulatory Visit: Payer: BLUE CROSS/BLUE SHIELD | Admitting: Internal Medicine

## 2018-08-20 ENCOUNTER — Encounter (INDEPENDENT_AMBULATORY_CARE_PROVIDER_SITE_OTHER): Payer: Self-pay

## 2018-08-20 ENCOUNTER — Other Ambulatory Visit: Payer: Self-pay

## 2018-08-20 VITALS — BP 131/81 | HR 94 | Temp 98.8°F | Ht 65.0 in | Wt 284.9 lb

## 2018-08-20 DIAGNOSIS — B373 Candidiasis of vulva and vagina: Secondary | ICD-10-CM | POA: Diagnosis not present

## 2018-08-20 DIAGNOSIS — E1142 Type 2 diabetes mellitus with diabetic polyneuropathy: Secondary | ICD-10-CM | POA: Diagnosis not present

## 2018-08-20 DIAGNOSIS — E1165 Type 2 diabetes mellitus with hyperglycemia: Secondary | ICD-10-CM

## 2018-08-20 DIAGNOSIS — B3731 Acute candidiasis of vulva and vagina: Secondary | ICD-10-CM

## 2018-08-20 LAB — POCT GLYCOSYLATED HEMOGLOBIN (HGB A1C): Hemoglobin A1C: 8.6 % — AB (ref 4.0–5.6)

## 2018-08-20 LAB — GLUCOSE, CAPILLARY: Glucose-Capillary: 198 mg/dL — ABNORMAL HIGH (ref 70–99)
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
## 2018-08-20 MED ORDER — FLUCONAZOLE 150 MG PO TABS: 150.0000 mg | ORAL_TABLET | ORAL | 0 refills | Status: DC

## 2018-08-20 MED ORDER — GLUCOSE BLOOD VI STRP: ORAL_STRIP | 4 refills | Status: AC

## 2018-08-20 MED ORDER — LANCETS MISC: 4 refills | Status: AC

## 2018-08-20 NOTE — Progress Notes (Signed)
   CC: yeast infection  HPI:  Sharon Fisher is a 48 y.o. with PMH as below presenting with what she states is a yeast infection.   Please see A&P for assessment of the patient's acute and chronic medical conditions.    Past Medical History:  Diagnosis Date  . Allergic rhinitis   . Diabetes mellitus   . Dizziness 06/25/2017  . Fatigue 04/15/2015  . FRACTURE, CLAVICLE, RIGHT 11/22/2008   Qualifier: Diagnosis of  By: Alfonse Alpers MD, Audelia Hives    . GERD (gastroesophageal reflux disease)   . Hip pain, bilateral    ? lipomas  . HIP PAIN, BILATERAL 04/05/2006   Annotation: since 1999 Qualifier: Diagnosis of  By: Conception Chancy MD, Hinton Dyer    . Hypertension   . Irregular menstruation   . Left-sided back pain 04/15/2015  . Lipoma of other skin and subcutaneous tissue 05/05/2008   Qualifier: Diagnosis of  By: Alfonse Alpers MD, Audelia Hives    . Lymphedema    chronic, since age 77  . Mammogram abnormal    06, s/p biopsy of mass and neg work up for neoplasm  . Neck pain on right side 06/25/2017  . Need for prophylactic vaccination with combined diphtheria-tetanus-pertussis (DTP) vaccine 07/15/2012  . Obesity   . Polyarthropathy    inflammatory  . Right ovarian cyst    s/p resection of r ovary 06  . RLQ PAIN 03/15/2009   Qualifier: Diagnosis of  By: Eyvonne Mechanic MD, Vijay    . Sebaceous cyst 07/15/2012  . Shoulder pain 07/26/2015  . Vaginal itching 12/27/2016  . Wears glasses    Review of Systems:   Review of Systems  Constitutional: Negative for chills and fever.  Gastrointestinal: Negative for abdominal pain, diarrhea and nausea.  Genitourinary: Negative for dysuria, frequency and urgency.  Skin: Negative for itching and rash.   Physical Exam:  Constitution: NAD, obese Respiratory: non-labored breathing Abdominal: NTTP, soft, non-distended  MSK: no edema, moving all extremities Neuro: a&o, normal affect  Skin: c/d/i    Vitals:   08/20/18 1535  BP: 131/81  Pulse: 94  Temp: 98.8 F (37.1 C)   TempSrc: Oral  SpO2: 100%  Weight: 284 lb 14.4 oz (129.2 kg)  Height: 5\' 5"  (1.651 m)    Assessment & Plan:   See Encounters Tab for problem based charting.  Patient discussed with Dr. Beryle Beams

## 2018-08-20 NOTE — Patient Instructions (Addendum)
Thank you for allowing [place] to provide your care today. Today we discussed your yeast infection and Type II diabetes.   I have ordered the following labs for you:  Hemoglobin A1c    I will call if any are abnormal.    Fluconazole (DIFLUCAN) 150 MG tablet once every three days.  Please start checking you glucose two times per day. Please keep a record of what your glucose levels are. We will call you in one week to discuss this, and change medications as needed.   Please follow-up if symptoms of yeast infection do not improve with medication.    Should you have any questions or concerns please call the internal medicine clinic at [phone].

## 2018-08-21 MED FILL — FLUCONAZOLE 150 MG TABS: 150 | 6 days supply | Qty: 2 | Fill #0

## 2018-08-22 NOTE — Assessment & Plan Note (Signed)
She does not check her glucose at home as her monitor has been broken. She takes glipizide 5mg  bid and metformin 1000 mg bid. Hemoglobin a1c today is 8.6 and in November was 7.1  - provided glucometer with instruction from [place], [place] - discussed that she needs increase in medication - we discussed stopping glipizide and starting either empagliflozin (although can increase risk for yeast infections) and a glp1 (which she would have to use a shot). She declines an increase in medication at this time and states she would like to take her blood sugar at home for the next week and try to improve her eating habits. Stressed that she would really need an increase in therapy to help decrease A1C, improve health and would likely continue to have yeast infections.  [name] and I will f/u one week to obtain records of glucose levels and will likely recommend increase in medication therapy.

## 2018-08-22 NOTE — Progress Notes (Signed)
Medicine attending: Medical history, presenting problems, physical findings, and medications, reviewed with resident physician Dr Molli Hazard on the day of the patient visit and I concur with her evaluation and management plan.

## 2018-08-22 NOTE — Assessment & Plan Note (Signed)
She states she has been getting recurrent yeast infections about once per month for the last several months that are straining her relationship with her husband. She denies pain, itching, abdominal pain, dysuria, or other changes in urination. She states there is only a thick white discharge when this happens and diflucan always resolves the issue. She does not check her glucose at home. She does not want to have a vaginal exam today.   - diflucan q72hours x 2 - explained that her yeast infections are an indication that her diabetes is not under control - see TIIDM

## 2018-08-27 ENCOUNTER — Encounter: Payer: Self-pay | Admitting: Dietician

## 2018-08-27 ENCOUNTER — Ambulatory Visit: Payer: BLUE CROSS/BLUE SHIELD | Admitting: Dietician

## 2018-08-27 DIAGNOSIS — E1142 Type 2 diabetes mellitus with diabetic polyneuropathy: Secondary | ICD-10-CM

## 2018-08-27 NOTE — Patient Instructions (Addendum)
Ms. Krogh,  You seem to be doing well with checking your blood sugars!!  You also mentioned you are trying to decrease the candy and your portions.   I am happy to assist you with the keto diet any time.   Here is the plan we discussed today to help you manage your blood sugars:  Plan-  1- call again to find out PM blood sugars- 230 pm or after in about 2 weeks  2- Check blood sugar in PM before eating or bedtime a few more times  3- Trying new medicine pill- oral semiglutide-  it may be expensive or not covered I'll speak to Drs. Winfery and Seawell.

## 2018-08-27 NOTE — Progress Notes (Addendum)
   Medical Nutrition Therapy:  Appt start time: 9480  end time:1340 . Visit # 1 Telephone visit due to COVID-19.  Assessment:  Primary concerns today: glycemic control.  Follow up call to see how she is doing with self monitoring and diabetes self care.   Trying to drink more liquids to help her eat less to lower her blood sugar. Wants to know if a Keto diet with vinegar daily would be help? Discussed that the keto diet sometimes helps with blood sugar control and if she';d like she can try it and see if she sees lower blood sugars when she checks. Encouraged eating solids rather than drinking her calories for more satisfaction and fullness.    Blood sugars- taking time to lance her finger, but she is managing. She is checking one time a day. 183-187 staying about that in am, after dinner last night it was in the 330s after 7 pm. She had forgotten to check earlier that day   Diet-  Breakfast- 8-830 am- hot chocolate x 2, coffee- not sure how much or what she oput sin it. On weekends fasts most of day with only 1 meal  Lunch- 1130- 1200 pm- chicken salad, grapefruit, weekend- fasts until 1pm- 2pm or later   Dinner- 5-530 on Saturday-cabbage, carrots, sausage, no bread, a sugar sweetened drink, ice cream sandwich  Snacks- ice cream, popsicles, put away chips, cookies, milkshakes  Candy bars- using snack size now and trying to eat less  Beverages- fruit punch drink- twisted blast small bottle 16 oz x1 per day, regular soda- 1/2 per day, water- 2 bottles/day   Medicine- metformin- 1000mg  two times a day- misses sometimes, but usually takes both late rin day spacing out as best she can. Gglipizide 5 mg two times a day. Not willing to try an injectable but wants to try Oral semiglutide. Education done about what it does and that it may not be covered or expensive.   Yeast infections- first pill - Thursday, took 2nd pill Monday. Not sure if she still has it. She did not have sex since, so  doesn't know because she did not have symptoms. She only knew she had a yeast infection from seeing discharge on his penis after they had intercourse.   Preferred Learning Style: No preference indicated  Learning Readiness: contemplative/change in progress  Progress Towards Goal(s):  Some progress.   Nutritional Diagnosis:  NB-1.4 Self-monitoring deficit As related to not having a meter and being afraid of needles/to lance herself.  As evidenced by her report.    Intervention:  Nutrition education about healthy food and beverage choices, self-monitoring. Action Goal:check blood sugar in evening more often  Outcome goal: increased knowledge about how food affects blood sugars, lower A1C Coordination of care: speak to Drs. Seawell and Winfrey about trail of oral semiglutide.   Teaching Method Utilized: Visual, Auditory,Hands on Handouts given during visit include: After visit summary Barriers to learning/adherence to lifestyle change: competing values, lack of support Demonstrated degree of understanding via:  Teach Back   Monitoring/Evaluation:  Dietary intake, exercise, meter, and body weight in 2- 3 week(s). Debera Lat, RD 08/27/2018 2:29 PM.

## 2018-09-03 ENCOUNTER — Other Ambulatory Visit: Payer: Self-pay | Admitting: Pharmacist

## 2018-09-03 DIAGNOSIS — E1142 Type 2 diabetes mellitus with diabetic polyneuropathy: Secondary | ICD-10-CM

## 2018-09-03 MED ORDER — SEMAGLUTIDE 3 MG PO TABS: 1.0000 | ORAL_TABLET | Freq: Every day | ORAL | 0 refills | Status: DC

## 2018-09-03 MED FILL — RYBELSUS 3 MG TABS: 3 | 30 days supply | Qty: 30 | Fill #0

## 2018-09-04 ENCOUNTER — Other Ambulatory Visit: Payer: Self-pay | Admitting: Internal Medicine

## 2018-09-04 DIAGNOSIS — I10 Essential (primary) hypertension: Secondary | ICD-10-CM

## 2018-09-04 MED FILL — glipiZIDE 5 MG TABS: 5 | 30 days supply | Qty: 60 | Fill #0

## 2018-09-04 MED FILL — metFORMIN HCL 1000 MG TABS: 1000 | 30 days supply | Qty: 60 | Fill #0

## 2018-09-04 MED FILL — ROSUVASTATIN CALCIUM 10 MG: 10 | 30 days supply | Qty: 30 | Fill #0

## 2018-09-05 NOTE — Telephone Encounter (Signed)
refilled 

## 2018-09-08 ENCOUNTER — Other Ambulatory Visit: Payer: Self-pay

## 2018-09-08 ENCOUNTER — Encounter: Payer: Self-pay | Admitting: Dietician

## 2018-09-08 ENCOUNTER — Ambulatory Visit (INDEPENDENT_AMBULATORY_CARE_PROVIDER_SITE_OTHER): Payer: BLUE CROSS/BLUE SHIELD | Admitting: Internal Medicine

## 2018-09-08 ENCOUNTER — Encounter: Payer: Self-pay | Admitting: Internal Medicine

## 2018-09-08 ENCOUNTER — Ambulatory Visit: Payer: BLUE CROSS/BLUE SHIELD | Admitting: Dietician

## 2018-09-08 DIAGNOSIS — E1142 Type 2 diabetes mellitus with diabetic polyneuropathy: Secondary | ICD-10-CM

## 2018-09-08 NOTE — Progress Notes (Signed)
Medicine attending: Medical history, presenting problems, and medications, reviewed with resident physician Dr William Winfrey on the day of the patient telephone consultation and I concur with his evaluation and management plan. 

## 2018-09-08 NOTE — Patient Instructions (Signed)
Hello Ms. Deerman,  So glad to hear you are doing better!!!  Here is what I think I heard you say today:  Your Action Goal: continue new medicine(Rybelsus) and decreased candy and soda, self monitoring(so you can see the improvement)    Your eventual Outcome goal: increased knowledge, improved health, self confidence and lower A1C   Talk to you in May.  See the information below for date and time of our appointment

## 2018-09-08 NOTE — Assessment & Plan Note (Signed)
   A: T2DM: recently approved for oral semaglutide, minimal side effects feels well with it.  Her sugars are improving staying mainly in the  and higher 100's with an occasional reading in the low 200's  Before this medicine was seeing readings in the 300's occasionally and more frequent readings in the 200s.  At this point sugar is really only high after dinner but she attributes this to what she is eating.  Overall she has improved her diet. Down to one soda per day, eating less carbs.  Losing some weight and abdominal girth.      P: Instructed her on proper way to take oral semaglutide 3mg  daily continue for 30 days and we can increase, she is reluctant to increase dose until we have repeat A1C  Continue metformin 1000mg  BID, glipizide 5mg  BID

## 2018-09-08 NOTE — Progress Notes (Signed)
   [facility] Telephone Encounter  Reason for call:   This telephone encounter was created for [name] on 09/08/2018 for the following purpose/cc T2DM   Pertinent Data:   Last A1C increased does not tolerate injectables got approval for oral semaglutide ROS: Pulmonary: pt denies increased work of breathing, shortness of breath,  Cardiac: pt denies palpitations, chest pain,   Abdominal: pt denies abdominal pain, nausea, vomiting, or diarrhea   Assessment / Plan / Recommendations:   A: T2DM: recently approved for oral semaglutide, minimal side effects feels well with it.  Her sugars are improving staying mainly in the  and higher 100's with an occasional reading in the low 200's  Before this medicine was seeing readings in the 300's occasionally and more frequent readings in the 200s.  At this point sugar is really only high after dinner but she attributes this to what she is eating.  Overall she has improved her diet. Down to one soda per day, eating less carbs.  Losing some weight and abdominal girth.      P: Instructed her on proper way to take oral semaglutide 3mg  daily continue for 30 days and we can increase, she is reluctant to increase dose until we have repeat A1C  Continue metformin 1000mg  BID, glipizide 5mg  BID  As always, pt is advised that if symptoms worsen or new symptoms arise, they should go to an urgent care facility or to to ER for further evaluation.   Consent and Medical Decision Making:   Patient discussed with Dr. Beryle Beams  This is a telephone encounter between Sharon Fisher and Vickki Muff on 09/08/2018 for T2DM. The visit was conducted with the patient located at home and Vickki Muff at [facility]. The patient's identity was confirmed using their DOB and current address. The patient has consented to being evaluated through a telephone encounter and understands the associated risks (an examination cannot be done and the  patient may need to come in for an appointment) / benefits (allows the patient to remain at home, decreasing exposure to coronavirus). I personally spent 15 minutes on medical discussion.

## 2018-09-08 NOTE — Progress Notes (Signed)
   Medical Nutrition Therapy:  Appt start time: 0905  end time:0915 Visit # 2 Telephone visit due to COVID-19. This visit was done from the office with Sharon Fisher in her home.   Assessment:  Primary concerns today: glycemic control.  Follow up call to see how she is doing with self monitoring and diabetes self care.  Sharon Fisher says she feels good, her stomach is less full. She has been taking the new medicine for 3 days. her copay is  10$.  She lists her family and sister and [employer] as supports for her diabetes self care.    Blood sugars-  checking more in the pms but it is still difficult for her to lance her finger. Lowest in past 2 weeks 159, most in lower 200s. She reports a decrease  Diet-   Candy bars- cut back on that "drasticly" Beverages- limiting herself to one soda per day, eating more grapefruit   Medicine- metformin- 1000mg  two times a day- misses sometimes, rybelsus weekly x3 days, Gglipizide 5 mg two times a day.   Preferred Learning Style: face to face Learning Readiness: contemplative/change in progress  Progress Towards Goal(s):  Some progress.   Nutritional Diagnosis:  NB-1.4 Self-monitoring deficit As related to not having a meter is improved and being afraid of needles/to lance herself is about the same As evidenced by her report.    Intervention:  Nutrition support provided today. Action Goal: continue new medicine(Rybelsus) and decreased candy and soda, self monitoring  Outcome goal: increased knowledge about how food affects blood sugars, lower A1C   Teaching Method Utilized: Auditory Handouts given during visit include: After visit summary Barriers to learning/adherence to lifestyle change: competing values Demonstrated degree of understanding via:  Teach Back   Monitoring/Evaluation:  Dietary intake, exercise, meter, and body weight in 4 week(s). Sharon Fisher, RD 09/08/2018 9:20 AM.

## 2018-09-24 MED FILL — ENALAPRIL MALEATE 20 MG TAB: 20 | 90 days supply | Qty: 90 | Fill #0

## 2018-10-04 ENCOUNTER — Other Ambulatory Visit: Payer: Self-pay | Admitting: Internal Medicine

## 2018-10-04 DIAGNOSIS — E1142 Type 2 diabetes mellitus with diabetic polyneuropathy: Secondary | ICD-10-CM

## 2018-10-04 MED FILL — glipiZIDE 5 MG TABS: 5 | 30 days supply | Qty: 60 | Fill #1

## 2018-10-04 MED FILL — metFORMIN HCL 1000 MG TABS: 1000 | 30 days supply | Qty: 60 | Fill #1

## 2018-10-04 MED FILL — ROSUVASTATIN CALCIUM 10 MG: 10 | 30 days supply | Qty: 30 | Fill #1

## 2018-10-06 ENCOUNTER — Other Ambulatory Visit: Payer: Self-pay | Admitting: *Deleted

## 2018-10-06 ENCOUNTER — Ambulatory Visit (INDEPENDENT_AMBULATORY_CARE_PROVIDER_SITE_OTHER): Payer: BLUE CROSS/BLUE SHIELD | Admitting: Dietician

## 2018-10-06 ENCOUNTER — Encounter: Payer: Self-pay | Admitting: Dietician

## 2018-10-06 DIAGNOSIS — E1142 Type 2 diabetes mellitus with diabetic polyneuropathy: Secondary | ICD-10-CM

## 2018-10-06 MED ORDER — SEMAGLUTIDE 3 MG PO TABS: 1.0000 | ORAL_TABLET | Freq: Every day | ORAL | 0 refills | Status: DC

## 2018-10-06 MED ORDER — SEMAGLUTIDE 7 MG PO TABS: 7.0000 mg | ORAL_TABLET | Freq: Every day | ORAL | 0 refills | Status: DC

## 2018-10-06 MED FILL — RYBELSUS 7 MG TABS: 7 | 30 days supply | Qty: 30 | Fill #0

## 2018-10-06 NOTE — Patient Instructions (Signed)
Ms. Rasnick,    You were correct about the RYBELSUS dose !! The increased dose is 7 mg! Please accept my apologies for incorrectly correcting you?    Way to go caring for your low blood sugar, checking your blood sugar. making healthier food and beverage choices and taking your medicine to care for your diabetes!  Talk to you in a month.  As always feel free to call my anytime.  [name] [phone]  How should I use Rybelsus?  Rybelsus (oral) is taken by mouth. Take this medicine on an empty stomach when you first wake up, at least 30 minutes before you eat or drink anything. Take this medicine with no more than 4 ounces of water. Swallow the tablet whole and do not crush, chew, or break it.  Rybelsus works best if you eat 30 to 60 minutes after taking it.  You may have low blood sugar (hypoglycemia) and feel very hungry, dizzy, irritable, confused, anxious, or shaky. To quickly treat hypoglycemia, eat or drink a fast-acting source of sugar (fruit juice, hard candy, crackers, raisins, or non-diet soda).  Also watch for signs of high blood sugar (hyperglycemia) such as increased thirst or urination.  Blood sugar levels can be affected by stress, illness, surgery, exercise, alcohol use, or skipping meals. Ask your doctor before changing your dose or medication schedule.  Call your doctor if you have ongoing vomiting or diarrhea, or if you are sweating more than usual. You can easily become dehydrated while taking Rybelsus. This can lead to kidney failure.   Oral Tablets: Initial dose: 3 mg orally once a day for 30 days; then 7 mg orally once a day If additional glycemic control is needed after at least 30 days at 7 mg/day, may increase to 14 mg orally once a day  Maximum dose: 14 mg/day (taking two 7 mg tablets to achieve a 14 mg dose is not recommended).

## 2018-10-06 NOTE — Addendum Note (Signed)
Addended by: Guadlupe Spanish B on: 10/06/2018 02:02 PM   Modules accepted: Orders

## 2018-10-06 NOTE — Progress Notes (Signed)
   Medical Nutrition Therapy:  Appt start time: 09010  end time:0922 Visit # 3 Telephone visit due to COVID-19. This visit was done from the office with Sharon Fisher in her home.   Assessment:  Primary concerns today: glycemic control. "when can I get my A1C done?" Doing "Awesome"- bad pain in right shoulder, comes and goes, front shoulder blade. She wonders if this is a side effect of the new medicine. Otherwise she likes it and is ready to increase her dose tomorrow. She had low blood sugar symptoms this past weekend with weakness and shy, dizzy whic she treated with a little juice and this alleviated her symptoms. Weight has come down.     Blood sugars-  highest 233- 187, 131, 157. Still does not like pricking finger, but knows does it to get the information Symptoms of low- one time this past  Weekend  Diet-   Candy bars- doing better liming them Beverages-not getting anymore; does not think about them as much.  Eating more grapefruit- cautioned to limit to 1 per day or less due to insteraction with statin therapy   Medicine- rybelsus daily ready to increase her dose tomorrow but needs a prescription sent in. , glipizide 5 mg two times a day. Suggest holding this whenn dose of rybelsus increased   Progress Towards Goal(s):  Some progress.   Nutritional Diagnosis:  NB-1.4 Self-monitoring deficit As related to not having a meter is resolved and being afraid of needles/to lance herself is about the same As evidenced by her report.    Intervention:  Nutrition support provided today. Action Goal: continue new medicine(Rybelsus) and decreased candy and soda, self monitoring  Outcome goal: increased knowledge about how food affects blood sugars, lower A1C  Coordination of care- consider discontinuing glipizide when possible  Teaching Method Utilized: Auditory Handouts given during visit include: After visit summary Barriers to learning/adherence to lifestyle change: competing  values Demonstrated degree of understanding via:  Teach Back   Monitoring/Evaluation:  Dietary intake, exercise, meter, and body weight in 4 week(s). Sharon Fisher, RD 10/06/2018 9:08 AM.

## 2018-10-06 NOTE — Progress Notes (Signed)
She did not say how low it went, she only described symptoms which were resolved with juice. I think it happened on a Saturday which is they day they attend church and usually go a long time without eating. Maybe she can be instructed to omit the glipizide on Saturday mornings?

## 2018-10-06 NOTE — Telephone Encounter (Signed)
Refilled for 7mg  daily as she is ready to increase dosage and will see if affordable.

## 2018-10-07 ENCOUNTER — Other Ambulatory Visit: Payer: Self-pay

## 2018-10-07 NOTE — Telephone Encounter (Signed)
Semaglutide (RYBELSUS) 7 MG TABS   Refill request @ [facility].

## 2018-10-07 NOTE — Telephone Encounter (Signed)
Called / informed pt rx was refilled and sent to [facility].

## 2018-10-28 ENCOUNTER — Other Ambulatory Visit: Payer: Self-pay | Admitting: Internal Medicine

## 2018-10-28 ENCOUNTER — Other Ambulatory Visit: Payer: Self-pay | Admitting: Dietician

## 2018-10-28 DIAGNOSIS — E1142 Type 2 diabetes mellitus with diabetic polyneuropathy: Secondary | ICD-10-CM

## 2018-10-28 MED ORDER — SEMAGLUTIDE 7 MG PO TABS: 7.0000 mg | ORAL_TABLET | Freq: Every day | ORAL | 0 refills | Status: DC

## 2018-10-28 NOTE — Telephone Encounter (Signed)
I tried to call the patient no answer and vm box full, was using 30 day supplies so we could titrate the dosage.  I will refill a 90 day supply of the 7mg  for now.

## 2018-10-28 NOTE — Telephone Encounter (Signed)
[name] would like a 90 day refill if possible. She says her medicine is "running out real fast". She says her blood sugars are still ding good on it.

## 2018-10-28 NOTE — Telephone Encounter (Signed)
Incoming call from pt - stated someone had called her;missed call. Informed  90 day of Semaglutide 7 mg has been sent to the pharmacy per Dr Shan Levans.

## 2018-10-28 NOTE — Telephone Encounter (Signed)
Sent script to [facility] as they are now open

## 2018-10-29 MED FILL — RYBELSUS 7 MG TABS: 7 | 90 days supply | Qty: 90 | Fill #0

## 2018-11-03 ENCOUNTER — Ambulatory Visit: Payer: BLUE CROSS/BLUE SHIELD | Admitting: Dietician

## 2018-11-04 ENCOUNTER — Ambulatory Visit (INDEPENDENT_AMBULATORY_CARE_PROVIDER_SITE_OTHER): Payer: BLUE CROSS/BLUE SHIELD | Admitting: Dietician

## 2018-11-04 ENCOUNTER — Encounter: Payer: Self-pay | Admitting: Dietician

## 2018-11-04 DIAGNOSIS — Z713 Dietary counseling and surveillance: Secondary | ICD-10-CM

## 2018-11-04 DIAGNOSIS — E1142 Type 2 diabetes mellitus with diabetic polyneuropathy: Secondary | ICD-10-CM

## 2018-11-04 NOTE — Progress Notes (Addendum)
   Medical Nutrition Therapy:  Appt start time: 09017  end [id number] Visit # 4 Telephone visit due to COVID-19. This visit was done from the office with Ms.Bartoszek in her home.   Assessment:  Primary concerns today: yeast infection and glycemic control  She wonders if her recurrent yeast infections is a side effect of Rybelsus. Yeast infection-wants to come in to see doctor for it if needed .    Blood sugars-  highest 221, lowest-162 .  Getting easier to check blood sugar. "Guess my finger is getting tougher". Symptoms of low- none Diet- Candy bars- every now and then; no chips Beverages- kicked the habit; beverages are water, oj, peach diet tea- suggested she check the labels for sugar and juice added sugar; consider diet, limit portions She is making more choices at dinner so she can eat what she wants Medicine- rybelsus 7mg /day, glipizide 5 mg two times a day. Metformin -wonders if tyhis should be cut out since there was a recall on it. We discussed this and that glipizide side effects are thought to be more problematic Activity- she works two jobs- a  full time job and part time job and both have her doing activity (she works with special needs adults)  Progress Towards Goal(s):  Some progress.   Nutritional Diagnosis:  NB-1.4 Self-monitoring deficit As related to not having a meter is resolved and being afraid of needles/to lance herself is improving As evidenced by her report.    Intervention:  Nutrition education about diabetes medicine side effects and yeast infections. Diabetes support provided today. Action Goal: continue new medicine(Rybelsus) and decreased candy and soda, self monitoring  Outcome goal: increased knowledge about how food affects blood sugars, lower A1C  Coordination of care- consider discontinuing glipizide when possible; retinal exam when she comes to the office  Teaching Method Utilized: Auditory Handouts given during visit include: After visit summary Barriers  to learning/adherence to lifestyle change: competing values Demonstrated degree of understanding via:  Teach Back   Monitoring/Evaluation:  Dietary intake, exercise, meter, and body weight in 6-12 months or sooner if needed. Debera Lat, RD 11/04/2018 9:17 AM.

## 2018-11-05 NOTE — Progress Notes (Signed)
Sharon Fisher  Unable reach pt on the phone, schedule appt in acc next week

## 2018-11-07 MED FILL — glipiZIDE 5 MG TABS: 5 | 30 days supply | Qty: 60 | Fill #2

## 2018-11-07 MED FILL — metFORMIN HCL 1000 MG TABS: 1000 | 30 days supply | Qty: 60 | Fill #2

## 2018-11-13 ENCOUNTER — Ambulatory Visit: Payer: BC Managed Care – PPO

## 2018-11-18 ENCOUNTER — Other Ambulatory Visit: Payer: Self-pay

## 2018-11-18 ENCOUNTER — Ambulatory Visit: Payer: BC Managed Care – PPO | Admitting: Internal Medicine

## 2018-11-18 VITALS — BP 149/87 | HR 97 | Temp 98.4°F | Wt 279.2 lb

## 2018-11-18 DIAGNOSIS — B373 Candidiasis of vulva and vagina: Secondary | ICD-10-CM | POA: Diagnosis not present

## 2018-11-18 DIAGNOSIS — E1142 Type 2 diabetes mellitus with diabetic polyneuropathy: Secondary | ICD-10-CM | POA: Diagnosis not present

## 2018-11-18 DIAGNOSIS — B3731 Acute candidiasis of vulva and vagina: Secondary | ICD-10-CM

## 2018-11-18 DIAGNOSIS — Z7984 Long term (current) use of oral hypoglycemic drugs: Secondary | ICD-10-CM

## 2018-11-18 DIAGNOSIS — Z8742 Personal history of other diseases of the female genital tract: Secondary | ICD-10-CM | POA: Diagnosis not present

## 2018-11-18 LAB — POCT URINALYSIS DIPSTICK
Bilirubin, UA: NEGATIVE
Glucose, UA: NEGATIVE
Ketones, UA: NEGATIVE
Leukocytes, UA: NEGATIVE
Nitrite, UA: NEGATIVE
Protein, UA: NEGATIVE
Spec Grav, UA: 1.02 (ref 1.010–1.025)
Urobilinogen, UA: 0.2 E.U./dL
pH, UA: 5 (ref 5.0–8.0)

## 2018-11-18 LAB — POCT GLYCOSYLATED HEMOGLOBIN (HGB A1C): Hemoglobin A1C: 7.2 % — AB (ref 4.0–5.6)

## 2018-11-18 LAB — GLUCOSE, CAPILLARY: Glucose-Capillary: 166 mg/dL — ABNORMAL HIGH (ref 70–99)

## 2018-11-18 MED ORDER — FLUCONAZOLE 150 MG PO TABS: 150.0000 mg | ORAL_TABLET | ORAL | 0 refills | Status: DC

## 2018-11-18 MED FILL — FLUCONAZOLE 150 MG TABS: 150 | 3 days supply | Qty: 2 | Fill #0

## 2018-11-18 NOTE — Progress Notes (Signed)
   CC: vaginal discharge   HPI:  Sharon Fisher is a 48 y.o. female with history of type II DM and recurrent yeast infections who presents for 2 weeks of thick white vaginal discharge. She denies vaginal pain, itching, abdominal pain, or urinary symptoms. This is similar to her previous yeast infections which have been successfully treated with Diflucan.  She is currently menstruating and declines pelvic exam at this time.   Past Medical History:  Diagnosis Date  . Allergic rhinitis   . Diabetes mellitus   . Dizziness 06/25/2017  . Fatigue 04/15/2015  . FRACTURE, CLAVICLE, RIGHT 11/22/2008   Qualifier: Diagnosis of  By: Alfonse Alpers MD, Audelia Hives    . GERD (gastroesophageal reflux disease)   . Hip pain, bilateral    ? lipomas  . HIP PAIN, BILATERAL 04/05/2006   Annotation: since 1999 Qualifier: Diagnosis of  By: Conception Chancy MD, Hinton Dyer    . Hypertension   . Irregular menstruation   . Left-sided back pain 04/15/2015  . Lipoma of other skin and subcutaneous tissue 05/05/2008   Qualifier: Diagnosis of  By: Alfonse Alpers MD, Audelia Hives    . Lymphedema    chronic, since age 16  . Mammogram abnormal    06, s/p biopsy of mass and neg work up for neoplasm  . Neck pain on right side 06/25/2017  . Need for prophylactic vaccination with combined diphtheria-tetanus-pertussis (DTP) vaccine 07/15/2012  . Obesity   . Polyarthropathy    inflammatory  . Right ovarian cyst    s/p resection of r ovary 06  . RLQ PAIN 03/15/2009   Qualifier: Diagnosis of  By: Eyvonne Mechanic MD, Vijay    . Sebaceous cyst 07/15/2012  . Shoulder pain 07/26/2015  . Vaginal itching 12/27/2016  . Wears glasses    Review of Systems:  Review of Systems  All other systems reviewed and are negative.   Physical Exam:  Vitals:   11/18/18 1507  BP: (!) 149/87  Pulse: 97  Temp: 98.4 F (36.9 C)  TempSrc: Oral  SpO2: 100%  Weight: 279 lb 3.2 oz (126.6 kg)   Physical Exam Constitutional:      General: She is not in acute distress.  Appearance: Normal appearance. She is obese.  Neck:     Musculoskeletal: Neck supple.  Cardiovascular:     Rate and Rhythm: Normal rate and regular rhythm.  Pulmonary:     Effort: Pulmonary effort is normal.     Breath sounds: Normal breath sounds.  Abdominal:     General: There is no distension.     Palpations: Abdomen is soft.     Tenderness: There is no abdominal tenderness.  Neurological:     Mental Status: She is alert.  Psychiatric:        Mood and Affect: Mood normal.        Behavior: Behavior normal.     Assessment & Plan:   See Encounters Tab for problem based charting.  Patient discussed with Dr. Angelia Mould

## 2018-11-18 NOTE — Patient Instructions (Signed)
Ms. Grennan, It was a pleasure meeting you.  I have sent in the prescription to treat your yeast infection, as well as placed the referral for a GYN visit.   We are checking your A1C today and will let you know the results.   Take care! Dr. Koleen Distance

## 2018-11-19 NOTE — Assessment & Plan Note (Signed)
Endorses 2 week history of thick white discharge that she primarily notices during intercourse with her husband. Patient has recurrent yeast infections in the setting of uncontrolled type II DM. A1C checked today which does show improvement from 8.6> 7.2. Better glycemic control should decrease infection frequency. Patient declines pelvic exam today. Requests referral to GYN.   - treat with Diflucan q 72 hours x 2

## 2018-11-23 ENCOUNTER — Encounter: Payer: Self-pay | Admitting: Internal Medicine

## 2018-11-23 NOTE — Assessment & Plan Note (Signed)
A1C improved to 7.2 since initiating semaglutide. Will continue current regimen of Semaglutide, Metformin and Glipizide.  She is also working with [name] on nutrition education.

## 2018-11-24 NOTE — Progress Notes (Signed)
Internal Medicine Clinic Attending  Case discussed with Dr. Bloomfield at the time of the visit.  We reviewed the resident's history and exam and pertinent patient test results.  I agree with the assessment, diagnosis, and plan of care documented in the resident's note.  

## 2018-12-02 ENCOUNTER — Encounter: Payer: Self-pay | Admitting: Internal Medicine

## 2018-12-02 ENCOUNTER — Ambulatory Visit (INDEPENDENT_AMBULATORY_CARE_PROVIDER_SITE_OTHER): Payer: BC Managed Care – PPO | Admitting: Internal Medicine

## 2018-12-02 ENCOUNTER — Other Ambulatory Visit: Payer: Self-pay

## 2018-12-02 VITALS — BP 147/81 | HR 86 | Temp 98.4°F | Ht 65.0 in | Wt 278.6 lb

## 2018-12-02 DIAGNOSIS — R110 Nausea: Secondary | ICD-10-CM | POA: Diagnosis not present

## 2018-12-02 DIAGNOSIS — R109 Unspecified abdominal pain: Secondary | ICD-10-CM | POA: Diagnosis not present

## 2018-12-02 DIAGNOSIS — R197 Diarrhea, unspecified: Secondary | ICD-10-CM | POA: Diagnosis not present

## 2018-12-02 DIAGNOSIS — R42 Dizziness and giddiness: Secondary | ICD-10-CM

## 2018-12-02 DIAGNOSIS — E119 Type 2 diabetes mellitus without complications: Secondary | ICD-10-CM

## 2018-12-02 DIAGNOSIS — D509 Iron deficiency anemia, unspecified: Secondary | ICD-10-CM

## 2018-12-02 DIAGNOSIS — I10 Essential (primary) hypertension: Secondary | ICD-10-CM

## 2018-12-02 NOTE — Patient Instructions (Addendum)
It was a pleasure to see you today [name]. Please make the following changes:   -Please stop keto max  -Please make sure to continue walking and Tai Chi -Follow with Sharon Fisher for dietary education  -Please continue using all of your current medication as shown on this form  -During this visit we also got some labs to check how your electrolytes, blood counts, and iron levels are doing. We will call you if there are any abnormalities   If you have any questions or concerns, please call our clinic at [phone] between 9am-5pm and after hours call [phone] and ask for the internal medicine resident on call. If you feel you are having a medical emergency please call 911.   Thank you, we look forward to help you remain healthy!  Lars Mage, MD Internal Medicine PGY2    Exercising to Lose Weight Exercise is structured, repetitive physical activity to improve fitness and health. Getting regular exercise is important for everyone. It is especially important if you are overweight. Being overweight increases your risk of heart disease, stroke, diabetes, high blood pressure, and several types of cancer. Reducing your calorie intake and exercising can help you lose weight. Exercise is usually categorized as moderate or vigorous intensity. To lose weight, most people need to do a certain amount of moderate-intensity or vigorous-intensity exercise each week. Moderate-intensity exercise  Moderate-intensity exercise is any activity that gets you moving enough to burn at least three times more energy (calories) than if you were sitting. Examples of moderate exercise include:  Walking a mile in 15 minutes.  Doing light yard work.  Biking at an easy pace. Most people should get at least 150 minutes (2 hours and 30 minutes) a week of moderate-intensity exercise to maintain their body weight. Vigorous-intensity exercise Vigorous-intensity exercise is any activity that gets you moving enough to  burn at least six times more calories than if you were sitting. When you exercise at this intensity, you should be working hard enough that you are not able to carry on a conversation. Examples of vigorous exercise include:  Running.  Playing a team sport, such as football, basketball, and soccer.  Jumping rope. Most people should get at least 75 minutes (1 hour and 15 minutes) a week of vigorous-intensity exercise to maintain their body weight. How can exercise affect me? When you exercise enough to burn more calories than you eat, you lose weight. Exercise also reduces body fat and builds muscle. The more muscle you have, the more calories you burn. Exercise also:  Improves mood.  Reduces stress and tension.  Improves your overall fitness, flexibility, and endurance.  Increases bone strength. The amount of exercise you need to lose weight depends on:  Your age.  The type of exercise.  Any health conditions you have.  Your overall physical ability. Talk to your health care provider about how much exercise you need and what types of activities are safe for you. What actions can I take to lose weight? Nutrition   Make changes to your diet as told by your health care provider or diet and nutrition specialist (dietitian). This may include: ? Eating fewer calories. ? Eating more protein. ? Eating less unhealthy fats. ? Eating a diet that includes fresh fruits and vegetables, whole grains, low-fat dairy products, and lean protein. ? Avoiding foods with added fat, salt, and sugar.  Drink plenty of water while you exercise to prevent dehydration or heat stroke. Activity  Choose an activity that you  enjoy and set realistic goals. Your health care provider can help you make an exercise plan that works for you.  Exercise at a moderate or vigorous intensity most days of the week. ? The intensity of exercise may vary from person to person. You can tell how intense a workout is for  you by paying attention to your breathing and heartbeat. Most people will notice their breathing and heartbeat get faster with more intense exercise.  Do resistance training twice each week, such as: ? Push-ups. ? Sit-ups. ? Lifting weights. ? Using resistance bands.  Getting short amounts of exercise can be just as helpful as long structured periods of exercise. If you have trouble finding time to exercise, try to include exercise in your daily routine. ? Get up, stretch, and walk around every 30 minutes throughout the day. ? Go for a walk during your lunch break. ? Park your car farther away from your destination. ? If you take public transportation, get off one stop early and walk the rest of the way. ? Make phone calls while standing up and walking around. ? Take the stairs instead of elevators or escalators.  Wear comfortable clothes and shoes with good support.  Do not exercise so much that you hurt yourself, feel dizzy, or get very short of breath. Where to find more information  U.S. Department of Health and Human Services: [employer]at  Centers for Disease Control and Prevention (CDC): http://www.wolf.info/ Contact a health care provider:  Before starting a new exercise program.  If you have questions or concerns about your weight.  If you have a medical problem that keeps you from exercising. Get help right away if you have any of the following while exercising:  Injury.  Dizziness.  Difficulty breathing or shortness of breath that does not go away when you stop exercising.  Chest pain.  Rapid heartbeat. Summary  Being overweight increases your risk of heart disease, stroke, diabetes, high blood pressure, and several types of cancer.  Losing weight happens when you burn more calories than you eat.  Reducing the amount of calories you eat in addition to getting regular moderate or vigorous exercise each week helps you lose weight. This information is not intended to  replace advice given to you by your health care provider. Make sure you discuss any questions you have with your health care provider. Document Released: 06/23/2010 Document Revised: 06/03/2017 Document Reviewed: 06/03/2017 Elsevier Patient Education  2020 [employer].

## 2018-12-02 NOTE — Assessment & Plan Note (Signed)
Presents with a 7-day history of worsening dizziness, lightheadedness, abdominal discomfort.  She states that her symptoms started last Tuesday, 11/25/2018 3 she took a dose of weight loss medication "Ketomax".  She notes a cramping/ bubbly type of feeling in her abdomen after taking the medication along with loose stools.  She also noted that her head was spinning and had difficulty turning her head.    Orthostatic vital signs were mildly positive in that the patient felt dizzy with positional changes and her blood pressure decreased from 147/81 when sitting to 130/78 when standing. She has lost 1 pound over the past 2 weeks.   Assessment and plan  The patient appears to be volume depleted secondary to using weight loss supplement and also do to decreased p.o. intake.  Of note, patient has a low iron level of 35, iron saturation 10, ferritin 25 in 2018.  Therefore, it is possible that the patient's deficiencies also contributing to her dizziness.  -Encouraged the patient to drink plenty of water up to 2 L daily -Stop keto Max weight loss supplementation -Continue semaglutide 7 mg daily which should help with weight loss -Continue working with Marveen Reeks on maintaining good weight loss diet -Continue exercising with walking and tai chi -We will get CBC, iron panel, BMP to look for other causes of her dizziness.

## 2018-12-02 NOTE — Progress Notes (Signed)
   CC: Dizziness  HPI:  [name] is a 48 y.o.  48 y.o female with diabetes mellitus 2, essential htn, iron deficiency anemia who presents for acute dizziness. Please see problem based charting for evaluation, assessment, and plan.  Past Medical History:  Diagnosis Date  . Allergic rhinitis   . Diabetes mellitus   . Dizziness 06/25/2017  . Fatigue 04/15/2015  . FRACTURE, CLAVICLE, RIGHT 11/22/2008   Qualifier: Diagnosis of  By: Alfonse Alpers MD, Audelia Hives    . GERD (gastroesophageal reflux disease)   . Hip pain, bilateral    ? lipomas  . HIP PAIN, BILATERAL 04/05/2006   Annotation: since 1999 Qualifier: Diagnosis of  By: Conception Chancy MD, Hinton Dyer    . Hypertension   . Irregular menstruation   . Left-sided back pain 04/15/2015  . Lipoma of other skin and subcutaneous tissue 05/05/2008   Qualifier: Diagnosis of  By: Alfonse Alpers MD, Audelia Hives    . Lymphedema    chronic, since age 67  . Mammogram abnormal    06, s/p biopsy of mass and neg work up for neoplasm  . Neck pain on right side 06/25/2017  . Need for prophylactic vaccination with combined diphtheria-tetanus-pertussis (DTP) vaccine 07/15/2012  . Obesity   . Polyarthropathy    inflammatory  . Right ovarian cyst    s/p resection of r ovary 06  . RLQ PAIN 03/15/2009   Qualifier: Diagnosis of  By: Eyvonne Mechanic MD, Vijay    . Sebaceous cyst 07/15/2012  . Shoulder pain 07/26/2015  . Vaginal itching 12/27/2016  . Wears glasses    Review of Systems:    Nausea, abdominal discomfort, dizziness, loose stool Denies vomiting  Physical Exam:  Vitals:   12/02/18 0900  BP: (!) 147/81  Pulse: 86  Temp: 98.4 F (36.9 C)  TempSrc: Oral  SpO2: 100%  Weight: 278 lb 9.6 oz (126.4 kg)  Height: 5\' 5"  (1.651 m)   Physical Exam  Constitutional: Appears well-developed and well-nourished. No distress.  HENT:  Head: Normocephalic and atraumatic.  Eyes: Conjunctivae are normal.  Cardiovascular: Normal rate, regular rhythm and normal heart sounds.   Respiratory: Effort normal and breath sounds normal. No respiratory distress. No wheezes.  GI: Soft. Bowel sounds are normal. No distension. There is no tenderness.  Musculoskeletal: Edema.  Neurological: Is alert. Subjective dizziness with positional change  Skin: Not diaphoretic. No erythema. Psychiatric: Normal mood and affect. Behavior is normal. Judgment and thought content normal.    Assessment & Plan:   See Encounters Tab for problem based charting.  Patient discussed with Dr. Rebeca Alert

## 2018-12-03 LAB — CBC WITH DIFFERENTIAL/PLATELET
Basophils Absolute: 0 10*3/uL (ref 0.0–0.2)
Basos: 1 %
EOS (ABSOLUTE): 0.2 10*3/uL (ref 0.0–0.4)
Eos: 3 %
Hematocrit: 35.1 % (ref 34.0–46.6)
Hemoglobin: 11.3 g/dL (ref 11.1–15.9)
Immature Grans (Abs): 0 10*3/uL (ref 0.0–0.1)
Immature Granulocytes: 0 %
Lymphocytes Absolute: 3.1 10*3/uL (ref 0.7–3.1)
Lymphs: 42 %
MCH: 28.3 pg (ref 26.6–33.0)
MCHC: 32.2 g/dL (ref 31.5–35.7)
MCV: 88 fL (ref 79–97)
Monocytes Absolute: 0.5 10*3/uL (ref 0.1–0.9)
Monocytes: 7 %
Neutrophils Absolute: 3.5 10*3/uL (ref 1.4–7.0)
Neutrophils: 47 %
Platelets: 326 10*3/uL (ref 150–450)
RBC: 4 x10E6/uL (ref 3.77–5.28)
RDW: 16.9 % — ABNORMAL HIGH (ref 11.7–15.4)
WBC: 7.4 10*3/uL (ref 3.4–10.8)

## 2018-12-03 LAB — BMP8+ANION GAP
Anion Gap: 14 mmol/L (ref 10.0–18.0)
BUN/Creatinine Ratio: 9 (ref 9–23)
BUN: 6 mg/dL (ref 6–24)
CO2: 23 mmol/L (ref 20–29)
Calcium: 9 mg/dL (ref 8.7–10.2)
Chloride: 104 mmol/L (ref 96–106)
Creatinine, Ser: 0.64 mg/dL (ref 0.57–1.00)
GFR calc Af Amer: 121 mL/min/{1.73_m2} (ref >59)
GFR calc non Af Amer: 105 mL/min/{1.73_m2} (ref >59)
Glucose: 89 mg/dL (ref 65–99)
Potassium: 4.4 mmol/L (ref 3.5–5.2)
Sodium: 141 mmol/L (ref 134–144)

## 2018-12-03 LAB — IRON AND TIBC
Iron Saturation: 12 % — ABNORMAL LOW (ref 15–55)
Iron: 40 ug/dL (ref 27–159)
Total Iron Binding Capacity: 331 ug/dL (ref 250–450)
UIBC: 291 ug/dL (ref 131–425)

## 2018-12-03 LAB — FERRITIN: Ferritin: 29 ng/mL (ref 15–150)

## 2018-12-08 MED FILL — ENALAPRIL MALEATE 20 MG TAB: 20 | 90 days supply | Qty: 90 | Fill #0

## 2018-12-08 MED FILL — metFORMIN HCL 1000 MG TABS: 1000 | 30 days supply | Qty: 60 | Fill #0

## 2018-12-08 MED FILL — glipiZIDE 5 MG TABS: 5 | 30 days supply | Qty: 60 | Fill #0

## 2018-12-10 NOTE — Progress Notes (Signed)
Internal Medicine Clinic Attending  Case discussed with Dr. Chundi at the time of the visit.  We reviewed the resident's history and exam and pertinent patient test results.  I agree with the assessment, diagnosis, and plan of care documented in the resident's note.  Alexander Raines, M.D., Ph.D.  

## 2018-12-26 MED FILL — ROSUVASTATIN CALCIUM 10 MG: 10 | 30 days supply | Qty: 30 | Fill #0

## 2019-01-08 ENCOUNTER — Telehealth: Payer: Self-pay | Admitting: Obstetrics & Gynecology

## 2019-01-08 NOTE — Telephone Encounter (Signed)
Called the patient and left a detailed voicemail of the covid19 restrictions as well as if she has been in close contact with someone who has had covid, diagnosed with [phone], or experienced any flu like symptoms such as fever, rash, sore throat, or shortness of breath please call our office to reschedule.

## 2019-01-09 ENCOUNTER — Other Ambulatory Visit: Payer: Self-pay

## 2019-01-09 ENCOUNTER — Encounter: Payer: BC Managed Care – PPO | Admitting: Family Medicine

## 2019-01-19 MED FILL — glipiZIDE 5 MG TABS: 5 | 30 days supply | Qty: 60 | Fill #1

## 2019-01-19 MED FILL — metFORMIN HCL 1000 MG TABS: 1000 | 30 days supply | Qty: 60 | Fill #1

## 2019-01-30 ENCOUNTER — Encounter: Payer: BC Managed Care – PPO | Admitting: Obstetrics and Gynecology

## 2019-02-02 ENCOUNTER — Other Ambulatory Visit: Payer: Self-pay | Admitting: Internal Medicine

## 2019-02-02 DIAGNOSIS — E1142 Type 2 diabetes mellitus with diabetic polyneuropathy: Secondary | ICD-10-CM

## 2019-02-05 MED FILL — RYBELSUS 7 MG TABS: 7 | 90 days supply | Qty: 90 | Fill #0

## 2019-02-05 NOTE — Telephone Encounter (Signed)
Opened in error

## 2019-02-19 ENCOUNTER — Telehealth: Payer: Self-pay | Admitting: Obstetrics and Gynecology

## 2019-02-19 NOTE — Telephone Encounter (Signed)
Called the patient to inform of the upcoming appointment. Informed the patient of no children or visitors due to covid18 restrictions. The patient answered no to the covid19 screening questions.

## 2019-02-20 ENCOUNTER — Other Ambulatory Visit (HOSPITAL_COMMUNITY)
Admission: RE | Admit: 2019-02-20 | Discharge: 2019-02-20 | Disposition: A | Discharge status: Home / Self Care | Payer: BC Managed Care – PPO | Source: Ambulatory Visit | Attending: Obstetrics and Gynecology | Admitting: Obstetrics and Gynecology

## 2019-02-20 ENCOUNTER — Encounter: Payer: Self-pay | Admitting: Obstetrics and Gynecology

## 2019-02-20 ENCOUNTER — Ambulatory Visit (INDEPENDENT_AMBULATORY_CARE_PROVIDER_SITE_OTHER): Payer: BC Managed Care – PPO | Admitting: Obstetrics and Gynecology

## 2019-02-20 ENCOUNTER — Other Ambulatory Visit: Payer: Self-pay

## 2019-02-20 VITALS — BP 142/88 | HR 83 | Wt 280.1 lb

## 2019-02-20 DIAGNOSIS — N898 Other specified noninflammatory disorders of vagina: Secondary | ICD-10-CM

## 2019-02-20 DIAGNOSIS — Z124 Encounter for screening for malignant neoplasm of cervix: Secondary | ICD-10-CM | POA: Diagnosis not present

## 2019-02-20 DIAGNOSIS — Z1231 Encounter for screening mammogram for malignant neoplasm of breast: Secondary | ICD-10-CM | POA: Diagnosis not present

## 2019-02-20 NOTE — Progress Notes (Signed)
GYNECOLOGY OFFICE VISIT NOTE  History:  48 y.o. G1P1 here today for chronic yeast infection. She reports she has chronic yeast infections, states her partners penis is covered with thick, white discharge or creamy white discharge. She rarely sees discharge in her underwear and reports she is unaware of discharge until she sees it on partner. Rarely has itching/burning. Has been going on 2-3 years total.  Has periods ever 3-4 months. Large clots and painful. Intercourse is uncomfortable. States she thinks she has scar tissue from prior sexual encounter.   Past Medical History:  Diagnosis Date  . Allergic rhinitis   . Diabetes mellitus   . Dizziness 06/25/2017  . Fatigue 04/15/2015  . FRACTURE, CLAVICLE, RIGHT 11/22/2008   Qualifier: Diagnosis of  By: Alfonse Alpers MD, Audelia Hives    . GERD (gastroesophageal reflux disease)   . Hip pain, bilateral    ? lipomas  . HIP PAIN, BILATERAL 04/05/2006   Annotation: since 1999 Qualifier: Diagnosis of  By: Conception Chancy MD, Hinton Dyer    . Hypertension   . Irregular menstruation   . Left-sided back pain 04/15/2015  . Lipoma of other skin and subcutaneous tissue 05/05/2008   Qualifier: Diagnosis of  By: Alfonse Alpers MD, Audelia Hives    . Lymphedema    chronic, since age 65  . Mammogram abnormal    06, s/p biopsy of mass and neg work up for neoplasm  . Neck pain on right side 06/25/2017  . Need for prophylactic vaccination with combined diphtheria-tetanus-pertussis (DTP) vaccine 07/15/2012  . Obesity   . Polyarthropathy    inflammatory  . Right ovarian cyst    s/p resection of r ovary 06  . RLQ PAIN 03/15/2009   Qualifier: Diagnosis of  By: Eyvonne Mechanic MD, Vijay    . Sebaceous cyst 07/15/2012  . Shoulder pain 07/26/2015  . Vaginal itching 12/27/2016  . Wears glasses     Past Surgical History:  Procedure Laterality Date  . BREAST REDUCTION SURGERY  1996  . DILATION AND CURETTAGE OF UTERUS    . MASS EXCISION N/A 02/27/2013   Procedure: CYST REMOVAL NECK;  Surgeon: Harl Bowie, MD;  Location: [place];  Service: General;  Laterality: N/A;  Excision chronic posterior neck cyst  . MYOMECTOMY ABDOMINAL APPROACH    . surgery of fibroids     but still has fibroids  . TONSILLECTOMY       Current Outpatient Medications:  .  acetaminophen (TYLENOL) 500 MG tablet, Take 2 tablets (1,000 mg total) by mouth every 8 (eight) hours as needed (pain)., Disp: 30 tablet, Rfl: 0 .  enalapril (VASOTEC) 20 MG tablet, TAKE 1 TABLET BY MOUTH DAILY., Disp: 90 tablet, Rfl: 1 .  ferrous gluconate (FERGON) 324 MG tablet, Take 1 tablet (324 mg total) by mouth daily with breakfast., Disp: 30 tablet, Rfl: 3 .  gabapentin (NEURONTIN) 300 MG capsule, Take 1 capsule (300 mg total) by mouth at bedtime as needed (neuropathy in feet)., Disp: 30 capsule, Rfl: 5 .  glipiZIDE (GLUCOTROL) 5 MG tablet, Take 1 tablet (5 mg total) by mouth 2 (two) times daily before a meal., Disp: 60 tablet, Rfl: 11 .  glucose blood (CONTOUR NEXT TEST) test strip, Use up to two times per day., Disp: 100 each, Rfl: 4 .  Lancets MISC, Use up to two times per day, Disp: 100 each, Rfl: 4 .  metFORMIN (GLUCOPHAGE) 1000 MG tablet, Take 1 tablet (1,000 mg total) by mouth 2 (two) times daily with a meal., Disp:  180 tablet, Rfl: 6 .  naproxen (NAPROSYN) 500 MG tablet, Take 1 tablet (500 mg total) by mouth 2 (two) times daily with a meal., Disp: 60 tablet, Rfl: 0 .  rosuvastatin (CRESTOR) 10 MG tablet, Take 1 tablet (10 mg total) by mouth at bedtime., Disp: 30 tablet, Rfl: 11 .  RYBELSUS 7 MG TABS, TAKE 1 TABLET BY MOUTH DAILY., Disp: 90 tablet, Rfl: 0 .  fluconazole (DIFLUCAN) 150 MG tablet, Take 1 tablet (150 mg total) by mouth every 3 (three) days. (Patient not taking: Reported on 02/20/2019), Disp: 2 tablet, Rfl: 0 .  propranolol (INDERAL) 40 MG tablet, Take 1 tablet (40 mg total) by mouth as needed. For anxiety, Disp: 30 tablet, Rfl: 0  The following portions of the patient's history were reviewed and  updated as appropriate: allergies, current medications, past family history, past medical history, past social history, past surgical history and problem list.   Health Maintenance:  Last pap: "its been a while", no history of abnormal Last mammogram: 10 years ago, reports abnormal lymph nodes  Review of Systems:  Pertinent items noted in HPI and remainder of comprehensive ROS otherwise negative.   Objective:  Physical Exam BP (!) 142/88   Pulse 83   Wt 280 lb 1.6 oz (127.1 kg)   LMP 01/30/2019 (Approximate)   BMI 46.61 kg/m  CONSTITUTIONAL: Well-developed, well-nourished female in no acute distress.  HENT:  Normocephalic, atraumatic. External right and left ear normal. Oropharynx is clear and moist EYES: Conjunctivae and EOM are normal. Pupils are equal, round, and reactive to light. No scleral icterus.  NECK: Normal range of motion, supple, no masses SKIN: Skin is warm and dry. No rash noted. Not diaphoretic. No erythema. No pallor. NEUROLOGIC: Alert and oriented to person, place, and time. Normal reflexes, muscle tone coordination. No cranial nerve deficit noted. PSYCHIATRIC: Normal mood and affect. Normal behavior. Normal judgment and thought content. CARDIOVASCULAR: Normal heart rate noted RESPIRATORY: Effort normal, no problems with respiration noted ABDOMEN: Soft, no distention noted.   PELVIC: Normal appearing external genitalia; normal appearing vaginal mucosa and cervix.  Moderate amount thick white discharge. Pap smear obtained.  pelvic cultures obtained. Normal uterine size, no other palpable masses, no uterine or adnexal tenderness. MUSCULOSKELETAL: Normal range of motion. No edema noted.  Exam done with chaperone present.  Labs and Imaging No results found.  Assessment & Plan:   1. Visit for screening mammogram - MM Digital Screening; Future  2. Cervical cancer screening Pap done today  3. Vaginal discharge Appearance of yeast Wet prep sent Reviewed  suppressive medication as needed Will have patient come in for self-swab next time she has symptoms, will eval for resistant yeast or other infection, then decide on suppressive versus recurrent treatment   Routine preventative health maintenance measures emphasized. Please refer to After Visit Summary for other counseling recommendations.   Return for will contact patient with results for follow up.   Total face-to-face time with patient: 30 minutes. Over 50% of encounter was spent on counseling and coordination of care.   Feliz Beam, M.D. Attending Center for [employer])

## 2019-02-23 LAB — CERVICOVAGINAL ANCILLARY ONLY
Bacterial Vaginitis (gardnerella): POSITIVE — AB
Candida Glabrata: NEGATIVE
Candida Vaginitis: NEGATIVE
Molecular Disclaimer: NEGATIVE
Molecular Disclaimer: NEGATIVE
Molecular Disclaimer: NORMAL

## 2019-02-24 LAB — CYTOLOGY - PAP
Diagnosis: NEGATIVE
High risk HPV: POSITIVE — AB
Molecular Disclaimer: 56
Molecular Disclaimer: DETECTED
Molecular Disclaimer: NORMAL

## 2019-02-27 ENCOUNTER — Telehealth: Payer: Self-pay | Admitting: *Deleted

## 2019-02-27 NOTE — Telephone Encounter (Addendum)
-----   Message from Sloan Leiter, MD sent at 02/27/2019 12:49 PM EDT ----- Patient needs f/u pap with HPV in 1 year  9/25  1310  Called pt and informed her Pap results and need for repeat Pap test in 1 year with testing for HPV. Pt voiced understanding.

## 2019-03-03 ENCOUNTER — Other Ambulatory Visit: Payer: Self-pay | Admitting: Internal Medicine

## 2019-03-03 DIAGNOSIS — I10 Essential (primary) hypertension: Secondary | ICD-10-CM

## 2019-03-04 MED FILL — ENALAPRIL MALEATE 20 MG TAB: 20 | 90 days supply | Qty: 90 | Fill #0

## 2019-03-04 NOTE — Telephone Encounter (Signed)
refilled 

## 2019-03-18 ENCOUNTER — Telehealth (INDEPENDENT_AMBULATORY_CARE_PROVIDER_SITE_OTHER): Payer: BC Managed Care – PPO

## 2019-03-18 DIAGNOSIS — N760 Acute vaginitis: Secondary | ICD-10-CM

## 2019-03-18 DIAGNOSIS — B9689 Other specified bacterial agents as the cause of diseases classified elsewhere: Secondary | ICD-10-CM

## 2019-03-18 MED ORDER — METRONIDAZOLE 500 MG PO TABS: 500.0000 mg | ORAL_TABLET | Freq: Two times a day (BID) | ORAL | 0 refills | Status: DC

## 2019-03-18 MED FILL — metroNIDAZOLE 500 MG TABS: 500 | 7 days supply | Qty: 14 | Fill #0

## 2019-03-18 MED FILL — glipiZIDE 5 MG TABS: 5 | 30 days supply | Qty: 60 | Fill #2

## 2019-03-18 NOTE — Telephone Encounter (Signed)
Pt tested positive for BV on 02/20/19, no treatment at that time. Flagyl 500 mg PO BID for 7 days rx sent to pt's pharmacy.  Pt called and notified of positive BV result and that rx has been sent to pharmacy. Reviewed administration instructions with pt. Pt verbalizes understanding.

## 2019-05-06 ENCOUNTER — Other Ambulatory Visit: Payer: Self-pay | Admitting: Internal Medicine

## 2019-05-06 DIAGNOSIS — E1142 Type 2 diabetes mellitus with diabetic polyneuropathy: Secondary | ICD-10-CM

## 2019-05-06 DIAGNOSIS — E785 Hyperlipidemia, unspecified: Secondary | ICD-10-CM

## 2019-05-06 MED FILL — RYBELSUS 7 MG TABS: 7 | 60 days supply | Qty: 60 | Fill #0

## 2019-05-06 MED FILL — ROSUVASTATIN CALCIUM 10 MG: 10 | 30 days supply | Qty: 30 | Fill #0

## 2019-05-06 MED FILL — glipiZIDE 5 MG TABS: 5 | 30 days supply | Qty: 60 | Fill #3

## 2019-05-06 NOTE — Telephone Encounter (Signed)
refilled 

## 2019-05-27 MED FILL — metFORMIN HCL 1000 MG TABS: 1000 | 30 days supply | Qty: 60 | Fill #3

## 2019-07-14 ENCOUNTER — Ambulatory Visit (HOSPITAL_COMMUNITY)
Admission: RE | Admit: 2019-07-14 | Discharge: 2019-07-14 | Disposition: A | Discharge status: Home / Self Care | Payer: BC Managed Care – PPO | Source: Ambulatory Visit | Attending: Internal Medicine | Admitting: Internal Medicine

## 2019-07-14 ENCOUNTER — Encounter: Payer: Self-pay | Admitting: Internal Medicine

## 2019-07-14 ENCOUNTER — Ambulatory Visit: Payer: BC Managed Care – PPO | Admitting: Internal Medicine

## 2019-07-14 ENCOUNTER — Other Ambulatory Visit: Payer: Self-pay

## 2019-07-14 ENCOUNTER — Other Ambulatory Visit: Payer: Self-pay | Admitting: Internal Medicine

## 2019-07-14 VITALS — BP 160/94 | HR 88 | Temp 98.2°F | Ht 65.0 in | Wt 277.0 lb

## 2019-07-14 DIAGNOSIS — I890 Lymphedema, not elsewhere classified: Secondary | ICD-10-CM | POA: Diagnosis not present

## 2019-07-14 DIAGNOSIS — I10 Essential (primary) hypertension: Secondary | ICD-10-CM | POA: Insufficient documentation

## 2019-07-14 DIAGNOSIS — M6289 Other specified disorders of muscle: Secondary | ICD-10-CM | POA: Diagnosis not present

## 2019-07-14 DIAGNOSIS — N926 Irregular menstruation, unspecified: Secondary | ICD-10-CM | POA: Diagnosis not present

## 2019-07-14 DIAGNOSIS — Z0000 Encounter for general adult medical examination without abnormal findings: Secondary | ICD-10-CM

## 2019-07-14 DIAGNOSIS — R5383 Other fatigue: Secondary | ICD-10-CM

## 2019-07-14 DIAGNOSIS — K219 Gastro-esophageal reflux disease without esophagitis: Secondary | ICD-10-CM | POA: Diagnosis not present

## 2019-07-14 DIAGNOSIS — D500 Iron deficiency anemia secondary to blood loss (chronic): Secondary | ICD-10-CM

## 2019-07-14 DIAGNOSIS — E119 Type 2 diabetes mellitus without complications: Secondary | ICD-10-CM | POA: Diagnosis not present

## 2019-07-14 DIAGNOSIS — L83 Acanthosis nigricans: Secondary | ICD-10-CM

## 2019-07-14 DIAGNOSIS — D509 Iron deficiency anemia, unspecified: Secondary | ICD-10-CM | POA: Diagnosis not present

## 2019-07-14 DIAGNOSIS — Z6841 Body Mass Index (BMI) 40.0 and over, adult: Secondary | ICD-10-CM

## 2019-07-14 MED ORDER — NAPROXEN 500 MG PO TABS: 500.0000 mg | ORAL_TABLET | Freq: Two times a day (BID) | ORAL | 0 refills | Status: AC

## 2019-07-14 MED FILL — NAPROXEN 500 MG TABLET: 500 | 7 days supply | Qty: 14 | Fill #0

## 2019-07-14 MED FILL — ENALAPRIL MALEATE 20 MG TAB: 20 | 90 days supply | Qty: 90 | Fill #1

## 2019-07-14 MED FILL — glipiZIDE 5 MG TABS: 5 | 30 days supply | Qty: 60 | Fill #4

## 2019-07-14 MED FILL — RYBELSUS 7 MG TABS: 7 | 60 days supply | Qty: 60 | Fill #1

## 2019-07-14 MED FILL — metFORMIN HCL 1000 MG TABS: 1000 | 30 days supply | Qty: 60 | Fill #4

## 2019-07-14 NOTE — Progress Notes (Signed)
   CC: Fatigue, "Muscle tightness"   HPI:  Sharon Fisher is a 49 y.o. African-American woman with medical history significant for iron deficiency anemia, irregular menstruation, morbid obesity, diabetes mellitus and hypertension who presents today for evaluation of fatigue and muscle tightness.  Please see problem based charting for further details.  Past Medical History:  Diagnosis Date  . Allergic rhinitis   . Diabetes mellitus   . Dizziness 06/25/2017  . Fatigue 04/15/2015  . FRACTURE, CLAVICLE, RIGHT 11/22/2008   Qualifier: Diagnosis of  By: Alfonse Alpers MD, Audelia Hives    . GERD (gastroesophageal reflux disease)   . Hip pain, bilateral    ? lipomas  . HIP PAIN, BILATERAL 04/05/2006   Annotation: since 1999 Qualifier: Diagnosis of  By: Conception Chancy MD, Hinton Dyer    . Hypertension   . Irregular menstruation   . Left-sided back pain 04/15/2015  . Lipoma of other skin and subcutaneous tissue 05/05/2008   Qualifier: Diagnosis of  By: Alfonse Alpers MD, Audelia Hives    . Lymphedema    chronic, since age 39  . Mammogram abnormal    06, s/p biopsy of mass and neg work up for neoplasm  . Neck pain on right side 06/25/2017  . Need for prophylactic vaccination with combined diphtheria-tetanus-pertussis (DTP) vaccine 07/15/2012  . Obesity   . Polyarthropathy    inflammatory  . Right ovarian cyst    s/p resection of r ovary 06  . RLQ PAIN 03/15/2009   Qualifier: Diagnosis of  By: Eyvonne Mechanic MD, Vijay    . Sebaceous cyst 07/15/2012  . Shoulder pain 07/26/2015  . Vaginal itching 12/27/2016  . Wears glasses    Review of Systems:  As per HPI  Physical Exam:  Vitals:   07/14/19 0855  BP: (!) 160/94  Pulse: 88  Temp: 98.2 F (36.8 C)  TempSrc: Oral  SpO2: 100%  Weight: 277 lb (125.6 kg)  Height: 5\' 5"  (1.651 m)   Physical Exam  Constitutional: She is oriented to person, place, and time and well-developed, well-nourished, and in no distress.  HENT:  Head: Normocephalic and atraumatic.  Eyes: Conjunctivae  are normal.  Cardiovascular: Normal rate, regular rhythm and normal heart sounds.  Pulmonary/Chest: Effort normal. She has no wheezes.  Musculoskeletal:        General: Normal range of motion.     Cervical back: Normal range of motion and neck supple.  Neurological: She is alert and oriented to person, place, and time. She has normal strength. Cranial nerve deficit: No motor deficits bilaterally, sensation intact bilaterally at the upper extremitites. Gait normal. GCS score is 15.  Skin:  Acanthosis niagrans  Psychiatric: Mood and affect normal.    Assessment & Plan:   See Encounters Tab for problem based charting.  Patient discussed with Dr. Philipp Ovens

## 2019-07-14 NOTE — Patient Instructions (Signed)
Ms. Ollis,   Thanks for seeing [place] today. I am obtaining an EKG and also getting blood work. I will call you with the results.   Take care! Dr. Eileen Stanford  Please call the internal medicine center clinic if you have any questions or concerns, we may be able to help and keep you from a long and expensive emergency room wait. Our clinic and after hours phone number is [phone], the best time to call is Monday through Friday 9 am to 4 pm but there is always someone available 24/7 if you have an emergency. If you need medication refills please notify your pharmacy one week in advance and they will send [place] a request.

## 2019-07-14 NOTE — Progress Notes (Signed)
Internal Medicine Clinic Attending  Case discussed with Dr. Agyei at the time of the visit.  We reviewed the resident's history and exam and pertinent patient test results.  I agree with the assessment, diagnosis, and plan of care documented in the resident's note.    

## 2019-07-14 NOTE — Assessment & Plan Note (Addendum)
Fatigue: Sharon Fisher reports that she was in her usual state of health until 3 days ago when she began experiencing fatigue and sleepiness.  She states that she was walking around and all of a sudden became tired, drained, weak with associated sensation of a pinched nerve in her shoulders bilaterally as well as muscle tightness in her bilateral extremities and lower extremities (at the hip).  This episode lasted from Saturday to Monday morning and improved. She denies neck pain, chest pain, shortness of breath, nausea, vomiting, abdominal pain, headaches, presyncope or syncopal episode.  Prior to this sensation of weakness, she states that she experience "heart fluttering.  "This is the first time that she is experienced such an episode.  Currently, she states that her fatigue has significantly improved but she still has ongoing "muscle tightness "upper extremities bilaterally that improves with movement of her fingers.  She denies any weakness in her pelvic muscles.  She states she has not been told that she snores but she does breathe heavily at night.  At about 12 to 1 pm PM every day, she will start to feel tired but denies morning headaches.   EKG performed in clinic unremarkable. Complete physical exam benign  Assessment: Atypical chest pain in a woman versus sequelae of iron deficiency anemia versus undiagnosed obstructive sleep apnea versus musculoskeletal etiology.  Plan: -CBC -Iron, ferritin, TIBC -If fatigue persists, she would warrant an official sleep study.  -If she experiences paresthesia, she would benefit from nerve conduction studies -Conservative management with short course of naproxen for now.  ADDENDUM: -Iron: 51 -Sat: 15 -Ferritin: 20  Consistent with her prior Iron deficiency anemia 2/2. She tells me that has not been on oral iron supplements. I prescribed/refilled Fergon 324mg  daily

## 2019-07-15 LAB — CBC
Hematocrit: 34.6 % (ref 34.0–46.6)
Hemoglobin: 10.9 g/dL — ABNORMAL LOW (ref 11.1–15.9)
MCH: 27.1 pg (ref 26.6–33.0)
MCHC: 31.5 g/dL (ref 31.5–35.7)
MCV: 86 fL (ref 79–97)
Platelets: 295 10*3/uL (ref 150–450)
RBC: 4.02 x10E6/uL (ref 3.77–5.28)
RDW: 16.7 % — ABNORMAL HIGH (ref 11.7–15.4)
WBC: 5.8 10*3/uL (ref 3.4–10.8)

## 2019-07-15 LAB — IRON AND TIBC
Iron Saturation: 15 % (ref 15–55)
Iron: 51 ug/dL (ref 27–159)
Total Iron Binding Capacity: 348 ug/dL (ref 250–450)
UIBC: 297 ug/dL (ref 131–425)

## 2019-07-15 LAB — FERRITIN: Ferritin: 20 ng/mL (ref 15–150)

## 2019-07-17 ENCOUNTER — Other Ambulatory Visit: Payer: Self-pay | Admitting: Internal Medicine

## 2019-07-17 DIAGNOSIS — D500 Iron deficiency anemia secondary to blood loss (chronic): Secondary | ICD-10-CM

## 2019-07-17 MED ORDER — FERROUS GLUCONATE 324 (38 FE) MG PO TABS: 324.0000 mg | ORAL_TABLET | Freq: Every day | ORAL | 11 refills | Status: DC

## 2019-07-17 NOTE — Progress Notes (Signed)
Called Sharon Fisher to discuss labs. She states that she has not been on iron supplements. I prescribed Fergon 324mg  1 pill/day

## 2019-08-17 ENCOUNTER — Ambulatory Visit: Payer: BC Managed Care – PPO

## 2019-08-17 ENCOUNTER — Other Ambulatory Visit: Payer: Self-pay | Admitting: Internal Medicine

## 2019-08-17 ENCOUNTER — Ambulatory Visit
Admission: RE | Admit: 2019-08-17 | Discharge: 2019-08-17 | Disposition: A | Discharge status: Home / Self Care | Payer: BC Managed Care – PPO | Source: Ambulatory Visit | Attending: Internal Medicine | Admitting: Internal Medicine

## 2019-08-17 ENCOUNTER — Other Ambulatory Visit: Payer: Self-pay

## 2019-08-17 DIAGNOSIS — E1121 Type 2 diabetes mellitus with diabetic nephropathy: Secondary | ICD-10-CM

## 2019-08-17 DIAGNOSIS — Z0000 Encounter for general adult medical examination without abnormal findings: Secondary | ICD-10-CM

## 2019-08-17 DIAGNOSIS — IMO0002 Reserved for concepts with insufficient information to code with codable children: Secondary | ICD-10-CM

## 2019-08-17 DIAGNOSIS — Z1231 Encounter for screening mammogram for malignant neoplasm of breast: Secondary | ICD-10-CM | POA: Diagnosis not present

## 2019-08-17 MED FILL — ROSUVASTATIN CALCIUM 10 MG: 10 | 30 days supply | Qty: 30 | Fill #1

## 2019-08-19 ENCOUNTER — Other Ambulatory Visit: Payer: Self-pay | Admitting: Internal Medicine

## 2019-08-19 DIAGNOSIS — R928 Other abnormal and inconclusive findings on diagnostic imaging of breast: Secondary | ICD-10-CM

## 2019-08-20 MED FILL — glipiZIDE 5 MG TABS: 5 | 90 days supply | Qty: 180 | Fill #0

## 2019-08-20 MED FILL — metFORMIN HCL 1000 MG TABS: 1000 | 90 days supply | Qty: 180 | Fill #0

## 2019-08-25 ENCOUNTER — Ambulatory Visit: Payer: BC Managed Care – PPO | Admitting: Internal Medicine

## 2019-08-25 VITALS — BP 157/83 | HR 98 | Temp 98.6°F | Ht 65.0 in | Wt 283.7 lb

## 2019-08-25 DIAGNOSIS — R221 Localized swelling, mass and lump, neck: Secondary | ICD-10-CM

## 2019-08-25 DIAGNOSIS — D1739 Benign lipomatous neoplasm of skin and subcutaneous tissue of other sites: Secondary | ICD-10-CM | POA: Diagnosis not present

## 2019-08-25 DIAGNOSIS — D171 Benign lipomatous neoplasm of skin and subcutaneous tissue of trunk: Secondary | ICD-10-CM

## 2019-08-25 DIAGNOSIS — D179 Benign lipomatous neoplasm, unspecified: Secondary | ICD-10-CM | POA: Insufficient documentation

## 2019-08-25 NOTE — Assessment & Plan Note (Addendum)
Patient presents with 2 week history of a new neck mass. She states it had significant swelling and fluctuance. Last week she started doing daily warm alcohol soaks at night and about 2 days ago it began to drain significant purulent and serosanguinous fluid. She has a video but does not have the phone it was recorded on with her. The site was initially warm with mild erythema and made her head difficult to move. No significant warmth nor erythema and her ROM is adequate for her body habitus.   She had an abscess previously  On her right neck drained multiple times. She states she had this tract on the left at that time too, but it never got as bad nor required drainage. Suspect abscess of unclear origin in her left neck. It may originated from debris entering the tract through the crease in her neck. - No active infection, no antibiotics at this time - Referral to General Surgery.

## 2019-08-25 NOTE — Patient Instructions (Addendum)
Thank you for allowing [place] to care for you  For your neck mass - This is consistent with abscess - Referral place to General Surgery

## 2019-08-25 NOTE — Assessment & Plan Note (Signed)
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 On exam a solid structure can be palpated, but it feels a litter deeper than expected. Will include this in her referral to general surgery as she states it has become tender to lay on and is starting to disrupt her daily life. - Referral to general surgery

## 2019-08-25 NOTE — Progress Notes (Signed)
   CC: Neck mass, Lipoma  HPI:  [name] is a 49 y.o. F with PMHx listed below presenting for left neck mass. Please see the A&P for the status of the patient's chronic medical problems.  Past Medical History:  Diagnosis Date  . Allergic rhinitis   . Diabetes mellitus   . Dizziness 06/25/2017  . Fatigue 04/15/2015  . FRACTURE, CLAVICLE, RIGHT 11/22/2008   Qualifier: Diagnosis of  By: Alfonse Alpers MD, Audelia Hives    . GERD (gastroesophageal reflux disease)   . Hip pain, bilateral    ? lipomas  . HIP PAIN, BILATERAL 04/05/2006   Annotation: since 1999 Qualifier: Diagnosis of  By: Conception Chancy MD, Hinton Dyer    . Hypertension   . Irregular menstruation   . Left-sided back pain 04/15/2015  . Lipoma of other skin and subcutaneous tissue 05/05/2008   Qualifier: Diagnosis of  By: Alfonse Alpers MD, Audelia Hives    . Lymphedema    chronic, since age 80  . Mammogram abnormal    06, s/p biopsy of mass and neg work up for neoplasm  . Neck pain on right side 06/25/2017  . Need for prophylactic vaccination with combined diphtheria-tetanus-pertussis (DTP) vaccine 07/15/2012  . Obesity   . Polyarthropathy    inflammatory  . Right ovarian cyst    s/p resection of r ovary 06  . RLQ PAIN 03/15/2009   Qualifier: Diagnosis of  By: Eyvonne Mechanic MD, Vijay    . Sebaceous cyst 07/15/2012  . Shoulder pain 07/26/2015  . Vaginal itching 12/27/2016  . Wears glasses    Review of Systems:  Performed and all others negative.  Physical Exam:  Vitals:   08/25/19 1433  BP: (!) 157/83  Pulse: 98  Temp: 98.6 F (37 C)  TempSrc: Oral  SpO2: (!) 79%  Weight: 283 lb 11.2 oz (128.7 kg)  Height: 5\' 5"  (1.651 m)   Physical Exam Constitutional:      General: She is not in acute distress.    Appearance: Normal appearance. She is obese.  Cardiovascular:     Rate and Rhythm: Normal rate and regular rhythm.     Pulses: Normal pulses.     Heart sounds: Normal heart sounds.  Pulmonary:     Effort: Pulmonary effort is normal. No  respiratory distress.     Breath sounds: Normal breath sounds.  Abdominal:     General: Bowel sounds are normal. There is no distension.     Palpations: Abdomen is soft.     Tenderness: There is no abdominal tenderness.  Musculoskeletal:        General: No swelling or deformity.     Comments: Fluctuance at Left anterior neck. Firmness and ?flucutuance at left posterior neck. Tract opens at crease of left posterior neck. Unable to express puss today. Chronic hyperpigmentation of neck (likely acanthosis nigricans).  POCUS shows discrete fluid pocket at left anterior neck (at site of fluctuance) and from there the tract heading toward her posterior neck is heterogeneous with hypoechoic area with what appear to be multiple septation.  Solid mass palpated on left hip.  Skin:    General: Skin is warm and dry.  Neurological:     General: No focal deficit present.     Mental Status: Mental status is at baseline.    Assessment & Plan:   See Encounters Tab for problem based charting.  Patient discussed with Dr. Rebeca Alert

## 2019-08-26 NOTE — Progress Notes (Signed)
Internal Medicine Clinic Attending  Case discussed with Dr. Melvin at the time of the visit.  We reviewed the resident's history and exam and pertinent patient test results.  I agree with the assessment, diagnosis, and plan of care documented in the resident's note.  Jenise Iannelli, M.D., Ph.D.  

## 2019-09-04 ENCOUNTER — Other Ambulatory Visit: Payer: Self-pay

## 2019-09-04 ENCOUNTER — Ambulatory Visit
Admission: RE | Admit: 2019-09-04 | Discharge: 2019-09-04 | Disposition: A | Discharge status: Home / Self Care | Payer: BC Managed Care – PPO | Source: Ambulatory Visit | Attending: Internal Medicine | Admitting: Internal Medicine

## 2019-09-04 DIAGNOSIS — R921 Mammographic calcification found on diagnostic imaging of breast: Secondary | ICD-10-CM | POA: Diagnosis not present

## 2019-09-04 DIAGNOSIS — R928 Other abnormal and inconclusive findings on diagnostic imaging of breast: Secondary | ICD-10-CM

## 2019-09-21 ENCOUNTER — Other Ambulatory Visit: Payer: Self-pay | Admitting: Surgery

## 2019-09-21 DIAGNOSIS — L089 Local infection of the skin and subcutaneous tissue, unspecified: Secondary | ICD-10-CM | POA: Diagnosis not present

## 2019-09-21 DIAGNOSIS — R2242 Localized swelling, mass and lump, left lower limb: Secondary | ICD-10-CM | POA: Diagnosis not present

## 2019-09-21 DIAGNOSIS — L723 Sebaceous cyst: Secondary | ICD-10-CM | POA: Diagnosis not present

## 2019-09-21 MED FILL — RYBELSUS 7 MG TABS: 7 | 60 days supply | Qty: 60 | Fill #2

## 2019-09-21 MED FILL — DOXYCYCLINE HYCLATE 100 MG: 100 | 10 days supply | Qty: 20 | Fill #0

## 2019-09-24 ENCOUNTER — Encounter: Payer: Self-pay | Admitting: *Deleted

## 2019-09-28 ENCOUNTER — Encounter (HOSPITAL_BASED_OUTPATIENT_CLINIC_OR_DEPARTMENT_OTHER): Payer: Self-pay | Admitting: Surgery

## 2019-09-28 ENCOUNTER — Other Ambulatory Visit: Payer: Self-pay

## 2019-10-02 ENCOUNTER — Other Ambulatory Visit (HOSPITAL_COMMUNITY)
Admission: RE | Admit: 2019-10-02 | Discharge: 2019-10-02 | Disposition: A | Discharge status: Home / Self Care | Payer: BC Managed Care – PPO | Source: Ambulatory Visit | Attending: Surgery | Admitting: Surgery

## 2019-10-02 ENCOUNTER — Encounter (HOSPITAL_BASED_OUTPATIENT_CLINIC_OR_DEPARTMENT_OTHER)
Admission: RE | Admit: 2019-10-02 | Discharge: 2019-10-02 | Disposition: A | Discharge status: Home / Self Care | Payer: BC Managed Care – PPO | Source: Ambulatory Visit

## 2019-10-02 DIAGNOSIS — M064 Inflammatory polyarthropathy: Secondary | ICD-10-CM | POA: Diagnosis not present

## 2019-10-02 DIAGNOSIS — Z818 Family history of other mental and behavioral disorders: Secondary | ICD-10-CM | POA: Diagnosis not present

## 2019-10-02 DIAGNOSIS — Z79899 Other long term (current) drug therapy: Secondary | ICD-10-CM | POA: Diagnosis not present

## 2019-10-02 DIAGNOSIS — Z01812 Encounter for preprocedural laboratory examination: Secondary | ICD-10-CM | POA: Diagnosis not present

## 2019-10-02 DIAGNOSIS — K219 Gastro-esophageal reflux disease without esophagitis: Secondary | ICD-10-CM | POA: Diagnosis not present

## 2019-10-02 DIAGNOSIS — I10 Essential (primary) hypertension: Secondary | ICD-10-CM | POA: Diagnosis not present

## 2019-10-02 DIAGNOSIS — Z8049 Family history of malignant neoplasm of other genital organs: Secondary | ICD-10-CM | POA: Diagnosis not present

## 2019-10-02 DIAGNOSIS — Z8261 Family history of arthritis: Secondary | ICD-10-CM | POA: Diagnosis not present

## 2019-10-02 DIAGNOSIS — Z833 Family history of diabetes mellitus: Secondary | ICD-10-CM | POA: Diagnosis not present

## 2019-10-02 DIAGNOSIS — Z803 Family history of malignant neoplasm of breast: Secondary | ICD-10-CM | POA: Diagnosis not present

## 2019-10-02 DIAGNOSIS — Z20822 Contact with and (suspected) exposure to covid-19: Secondary | ICD-10-CM | POA: Diagnosis not present

## 2019-10-02 DIAGNOSIS — F419 Anxiety disorder, unspecified: Secondary | ICD-10-CM | POA: Diagnosis not present

## 2019-10-02 DIAGNOSIS — L723 Sebaceous cyst: Secondary | ICD-10-CM | POA: Diagnosis not present

## 2019-10-02 DIAGNOSIS — Z6841 Body Mass Index (BMI) 40.0 and over, adult: Secondary | ICD-10-CM | POA: Diagnosis not present

## 2019-10-02 DIAGNOSIS — Z87891 Personal history of nicotine dependence: Secondary | ICD-10-CM | POA: Diagnosis not present

## 2019-10-02 DIAGNOSIS — E669 Obesity, unspecified: Secondary | ICD-10-CM | POA: Diagnosis not present

## 2019-10-02 DIAGNOSIS — M1990 Unspecified osteoarthritis, unspecified site: Secondary | ICD-10-CM | POA: Diagnosis not present

## 2019-10-02 DIAGNOSIS — D171 Benign lipomatous neoplasm of skin and subcutaneous tissue of trunk: Secondary | ICD-10-CM | POA: Diagnosis not present

## 2019-10-02 DIAGNOSIS — E119 Type 2 diabetes mellitus without complications: Secondary | ICD-10-CM | POA: Diagnosis not present

## 2019-10-02 LAB — BASIC METABOLIC PANEL
Anion gap: 9 (ref 5–15)
BUN: 9 mg/dL (ref 6–20)
CO2: 25 mmol/L (ref 22–32)
Calcium: 8.9 mg/dL (ref 8.9–10.3)
Chloride: 105 mmol/L (ref 98–111)
Creatinine, Ser: 0.59 mg/dL (ref 0.44–1.00)
GFR calc Af Amer: >60 mL/min (ref >60)
GFR calc non Af Amer: >60 mL/min (ref >60)
Glucose, Bld: 174 mg/dL — ABNORMAL HIGH (ref 70–99)
Potassium: 4.1 mmol/L (ref 3.5–5.1)
Sodium: 139 mmol/L (ref 135–145)

## 2019-10-02 LAB — SARS CORONAVIRUS 2 (TAT 6-24 HRS): SARS Coronavirus 2: NEGATIVE

## 2019-10-02 LAB — POCT PREGNANCY, URINE: Preg Test, Ur: NEGATIVE

## 2019-10-02 MED ORDER — ENSURE PRE-SURGERY PO LIQD: 296.0000 mL | Freq: Once | ORAL | Status: DC

## 2019-10-02 NOTE — Progress Notes (Signed)

## 2019-10-04 NOTE — H&P (Signed)
Sharon Fisher Documented: 09/21/2019 4:17 PM Location: [facility] Patient #: [id number] DOB: 04/10/1971 Married / Language: English / Race: Black or African American Female   History of Present Illness (Sharon Wernick A. Ninfa Linden MD; 09/21/2019 4:39 PM) The patient is a 49 year old female who presents with a complaint of Mass.  Chief complaint: Chronically draining sebaceous cyst of the posterior neck and left hip mass.  This is a 49 year old female with multiple comorbidities including diabetes, obesity, and hypertension who has had previous infected sebaceous cyst in the past. She has developed another one on her posterior neck which is actively draining purulence right now. It is improving but she is not on antibiotics. She also complains of a mass that is been present for many years on her left hip that is being larger and now causing increasing discomfort especially when she tries to lay on her side.   Past Surgical History Malachi Bonds, CMA; 09/21/2019 4:17 PM) Breast Augmentation  Bilateral. Breast Biopsy  Left.  Diagnostic Studies History Malachi Bonds, CMA; 09/21/2019 4:17 PM) Colonoscopy  never Mammogram  within last year Pap Smear  1-5 years ago  Allergies Malachi Bonds, CMA; 09/21/2019 4:17 PM) No Known Drug Allergies  [09/21/2019]:  Medication History Malachi Bonds, CMA; 09/21/2019 4:18 PM) Enalapril Maleate (20MG  Tablet, Oral) Active. glipiZIDE (5MG  Tablet, Oral) Active. metFORMIN HCl (1000MG  Tablet, Oral) Active. Rybelsus (7MG  Tablet, Oral) Active. Medications Reconciled  Social History Malachi Bonds, CMA; 09/21/2019 4:17 PM) Caffeine use  Carbonated beverages. No alcohol use  No drug use  Tobacco use  Former smoker.  Family History Malachi Bonds, [place]; 09/21/2019 4:17 PM) Arthritis  Mother, Sister. Breast Cancer  Mother. Cervical Cancer  Sister. Depression  Daughter, Sister. Diabetes Mellitus  Mother.  Pregnancy / Birth  History Malachi Bonds, CMA; 09/21/2019 4:17 PM) Age at menarche  14 years. Irregular periods  Maternal age  22-25 Para  1  Other Problems Malachi Bonds, CMA; 09/21/2019 4:17 PM) Arthritis  Diabetes Mellitus     Review of Systems Malachi Bonds CMA; 09/21/2019 4:17 PM) General Not Present- Appetite Loss, Chills, Fatigue, Fever, Night Sweats, Weight Gain and Weight Loss. Skin Not Present- Change in Wart/Mole, Dryness, Hives, Jaundice, New Lesions, Non-Healing Wounds, Rash and Ulcer. HEENT Present- Seasonal Allergies. Not Present- Earache, Hearing Loss, Hoarseness, Nose Bleed, Oral Ulcers, Ringing in the Ears, Sinus Pain, Sore Throat, Visual Disturbances, Wears glasses/contact lenses and Yellow Eyes. Respiratory Present- Snoring. Not Present- Bloody sputum, Chronic Cough, Difficulty Breathing and Wheezing. Breast Not Present- Breast Mass, Breast Pain, Nipple Discharge and Skin Changes. Gastrointestinal Not Present- Abdominal Pain, Bloating, Bloody Stool, Change in Bowel Habits, Chronic diarrhea, Constipation, Difficulty Swallowing, Excessive gas, Gets full quickly at meals, Hemorrhoids, Indigestion, Nausea, Rectal Pain and Vomiting. Psychiatric Not Present- Anxiety, Bipolar, Change in Sleep Pattern, Depression, Fearful and Frequent crying. Endocrine Present- Hot flashes. Not Present- Cold Intolerance, Excessive Hunger, Hair Changes, Heat Intolerance and New Diabetes.  Vitals (Chemira Jones CMA; 09/21/2019 4:17 PM) 09/21/2019 4:17 PM Weight: 286.6 lb Height: 65.5in Body Surface Area: 2.32 m Body Mass Index: 46.97 kg/m  BP: 160/90(Sitting, Left Arm, Standard)       Physical Exam (Betsaida [place] A. Ninfa Linden MD; 09/21/2019 4:40 PM) The physical exam findings are as follows: Note: On exam, there is a chronically infected sebaceous cyst on her posterior neck in a skin fold which is actively draining a small amount of purulence. It is almost nontender now. There are chronic skin changes  around  She also has a  7 cm mass in subcutaneous tissue over the left hip. It is soft, mildly tender but mobile. It is nonpulsatile and there are no skin changes    Assessment & Plan (Sanam Marmo A. Ninfa Linden MD; 09/21/2019 4:42 PM) INFECTED SEBACEOUS CYST (L72.3) HIP MASS, LEFT (R22.42) Impression: I have discussed the diagnosis of the chronically infected sebaceous cyst with her. Surgical excision of this is strongly recommended to prevent ongoing infection especially given her diabetes. She has had a similar procedure in the past and is wanting to proceed with the surgery on this. We again discussed the diagnosis. I discussed the surgical procedure in detail including the risk of bleeding, infection, and recurrence. I will place her mailbox preoperatively. I suspected the left hip mass is a lipoma given the physical examination. It is quite large and symptomatic so complete surgical excision is recommended to relieve her symptoms and for histologic evaluation to rule out malignancy. I again discussed the procedure with her regarding the hip mass. I discussed the risk of bleeding and infection as well as poor wound healing given her diabetes, obesity, and hypertension. She understands and wishes to proceed with surgery which will be scheduled for excision of the chronically infected posterior neck mass and left hip mass. This patient encounter took 30 minutes today to perform the following: take history, perform exam, review outside records, interpret imaging, counsel the patient on their diagnosis and document encounter, findings & plan in the EHR Current Plans

## 2019-10-05 ENCOUNTER — Ambulatory Visit (HOSPITAL_BASED_OUTPATIENT_CLINIC_OR_DEPARTMENT_OTHER): Payer: BC Managed Care – PPO | Admitting: Anesthesiology

## 2019-10-05 ENCOUNTER — Encounter (HOSPITAL_BASED_OUTPATIENT_CLINIC_OR_DEPARTMENT_OTHER): Payer: Self-pay | Admitting: Surgery

## 2019-10-05 ENCOUNTER — Encounter (HOSPITAL_BASED_OUTPATIENT_CLINIC_OR_DEPARTMENT_OTHER)
Admission: RE | Disposition: A | Discharge status: Home / Self Care | Payer: Self-pay | Source: Home / Self Care | Attending: Surgery

## 2019-10-05 ENCOUNTER — Ambulatory Visit (HOSPITAL_BASED_OUTPATIENT_CLINIC_OR_DEPARTMENT_OTHER)
Admission: RE | Admit: 2019-10-05 | Discharge: 2019-10-05 | Disposition: A | Discharge status: Home / Self Care | Payer: BC Managed Care – PPO | Attending: Surgery | Admitting: Surgery

## 2019-10-05 DIAGNOSIS — E669 Obesity, unspecified: Secondary | ICD-10-CM | POA: Insufficient documentation

## 2019-10-05 DIAGNOSIS — E119 Type 2 diabetes mellitus without complications: Secondary | ICD-10-CM | POA: Insufficient documentation

## 2019-10-05 DIAGNOSIS — K219 Gastro-esophageal reflux disease without esophagitis: Secondary | ICD-10-CM | POA: Diagnosis not present

## 2019-10-05 DIAGNOSIS — Z8261 Family history of arthritis: Secondary | ICD-10-CM | POA: Insufficient documentation

## 2019-10-05 DIAGNOSIS — Z803 Family history of malignant neoplasm of breast: Secondary | ICD-10-CM | POA: Diagnosis not present

## 2019-10-05 DIAGNOSIS — Z818 Family history of other mental and behavioral disorders: Secondary | ICD-10-CM | POA: Insufficient documentation

## 2019-10-05 DIAGNOSIS — Z8049 Family history of malignant neoplasm of other genital organs: Secondary | ICD-10-CM | POA: Insufficient documentation

## 2019-10-05 DIAGNOSIS — Z833 Family history of diabetes mellitus: Secondary | ICD-10-CM | POA: Diagnosis not present

## 2019-10-05 DIAGNOSIS — J45909 Unspecified asthma, uncomplicated: Secondary | ICD-10-CM | POA: Diagnosis not present

## 2019-10-05 DIAGNOSIS — Z79899 Other long term (current) drug therapy: Secondary | ICD-10-CM | POA: Diagnosis not present

## 2019-10-05 DIAGNOSIS — I10 Essential (primary) hypertension: Secondary | ICD-10-CM | POA: Diagnosis not present

## 2019-10-05 DIAGNOSIS — Z6841 Body Mass Index (BMI) 40.0 and over, adult: Secondary | ICD-10-CM | POA: Insufficient documentation

## 2019-10-05 DIAGNOSIS — R2242 Localized swelling, mass and lump, left lower limb: Secondary | ICD-10-CM | POA: Diagnosis not present

## 2019-10-05 DIAGNOSIS — F419 Anxiety disorder, unspecified: Secondary | ICD-10-CM | POA: Diagnosis not present

## 2019-10-05 DIAGNOSIS — E785 Hyperlipidemia, unspecified: Secondary | ICD-10-CM | POA: Diagnosis not present

## 2019-10-05 DIAGNOSIS — M1990 Unspecified osteoarthritis, unspecified site: Secondary | ICD-10-CM | POA: Insufficient documentation

## 2019-10-05 DIAGNOSIS — D171 Benign lipomatous neoplasm of skin and subcutaneous tissue of trunk: Secondary | ICD-10-CM | POA: Diagnosis not present

## 2019-10-05 DIAGNOSIS — Z87891 Personal history of nicotine dependence: Secondary | ICD-10-CM | POA: Diagnosis not present

## 2019-10-05 DIAGNOSIS — L723 Sebaceous cyst: Secondary | ICD-10-CM | POA: Diagnosis not present

## 2019-10-05 DIAGNOSIS — M064 Inflammatory polyarthropathy: Secondary | ICD-10-CM | POA: Insufficient documentation

## 2019-10-05 HISTORY — PX: MASS EXCISION: SHX2000

## 2019-10-05 LAB — GLUCOSE, CAPILLARY
Glucose-Capillary: 78 mg/dL (ref 70–99)
Glucose-Capillary: 91 mg/dL (ref 70–99)

## 2019-10-05 LAB — POCT PREGNANCY, URINE: Preg Test, Ur: NEGATIVE

## 2019-10-05 SURGERY — EXCISION MASS
Anesthesia: General | Site: Hip | Laterality: Left

## 2019-10-05 MED ORDER — CELECOXIB 200 MG PO CAPS
ORAL_CAPSULE | ORAL | Status: AC
  Filled 2019-10-05: qty 1

## 2019-10-05 MED ORDER — FENTANYL CITRATE (PF) 100 MCG/2ML IJ SOLN
INTRAMUSCULAR | Status: DC | PRN
  Administered 2019-10-05: 100 ug via INTRAVENOUS

## 2019-10-05 MED ORDER — LACTATED RINGERS IV SOLN: INTRAVENOUS | Status: DC

## 2019-10-05 MED ORDER — BUPIVACAINE HCL (PF) 0.5 % IJ SOLN
INTRAMUSCULAR | Status: DC | PRN
  Administered 2019-10-05: 20 mL

## 2019-10-05 MED ORDER — ONDANSETRON HCL 4 MG/2ML IJ SOLN
INTRAMUSCULAR | Status: DC | PRN
  Administered 2019-10-05: 4 mg via INTRAVENOUS

## 2019-10-05 MED ORDER — ALBUTEROL SULFATE HFA 108 (90 BASE) MCG/ACT IN AERS
INHALATION_SPRAY | RESPIRATORY_TRACT | Status: DC | PRN
  Administered 2019-10-05: 8 via RESPIRATORY_TRACT

## 2019-10-05 MED ORDER — LIDOCAINE 2% (20 MG/ML) 5 ML SYRINGE
INTRAMUSCULAR | Status: DC | PRN
  Administered 2019-10-05: 60 mg via INTRAVENOUS

## 2019-10-05 MED ORDER — OXYCODONE HCL 5 MG PO TABS: 5.0000 mg | ORAL_TABLET | Freq: Four times a day (QID) | ORAL | 0 refills | Status: DC | PRN

## 2019-10-05 MED ORDER — PROPOFOL 10 MG/ML IV BOLUS
INTRAVENOUS | Status: DC | PRN
  Administered 2019-10-05: 200 mg via INTRAVENOUS
  Administered 2019-10-05: 50 mg via INTRAVENOUS

## 2019-10-05 MED ORDER — CHLORHEXIDINE GLUCONATE CLOTH 2 % EX PADS: 6.0000 | MEDICATED_PAD | Freq: Once | CUTANEOUS | Status: DC

## 2019-10-05 MED ORDER — ACETAMINOPHEN 500 MG PO TABS
ORAL_TABLET | ORAL | Status: AC
  Filled 2019-10-05: qty 1

## 2019-10-05 MED ORDER — BACITRACIN ZINC 500 UNIT/GM EX OINT
TOPICAL_OINTMENT | CUTANEOUS | Status: AC
  Filled 2019-10-05: qty 0.9

## 2019-10-05 MED ORDER — MIDAZOLAM HCL 2 MG/2ML IJ SOLN: 1.0000 mg | INTRAMUSCULAR | Status: DC | PRN

## 2019-10-05 MED ORDER — SUCCINYLCHOLINE CHLORIDE 20 MG/ML IJ SOLN
INTRAMUSCULAR | Status: DC | PRN
  Administered 2019-10-05: 120 mg via INTRAVENOUS

## 2019-10-05 MED ORDER — DEXAMETHASONE SODIUM PHOSPHATE 10 MG/ML IJ SOLN
INTRAMUSCULAR | Status: AC
  Filled 2019-10-05: qty 3

## 2019-10-05 MED ORDER — MIDAZOLAM HCL 5 MG/5ML IJ SOLN
INTRAMUSCULAR | Status: DC | PRN
  Administered 2019-10-05: 2 mg via INTRAVENOUS

## 2019-10-05 MED ORDER — LIDOCAINE 2% (20 MG/ML) 5 ML SYRINGE
INTRAMUSCULAR | Status: AC
  Filled 2019-10-05: qty 10

## 2019-10-05 MED ORDER — ACETAMINOPHEN 500 MG PO TABS
ORAL_TABLET | ORAL | Status: AC
  Filled 2019-10-05: qty 2

## 2019-10-05 MED ORDER — GABAPENTIN 300 MG PO CAPS
300.0000 mg | ORAL_CAPSULE | ORAL | Status: AC
  Administered 2019-10-05: 300 mg via ORAL

## 2019-10-05 MED ORDER — CELECOXIB 200 MG PO CAPS
400.0000 mg | ORAL_CAPSULE | ORAL | Status: AC
  Administered 2019-10-05: 400 mg via ORAL

## 2019-10-05 MED ORDER — FENTANYL CITRATE (PF) 100 MCG/2ML IJ SOLN
INTRAMUSCULAR | Status: AC
  Filled 2019-10-05: qty 2

## 2019-10-05 MED ORDER — OXYCODONE HCL 5 MG/5ML PO SOLN: 5.0000 mg | Freq: Once | ORAL | Status: DC | PRN

## 2019-10-05 MED ORDER — DEXAMETHASONE SODIUM PHOSPHATE 10 MG/ML IJ SOLN
INTRAMUSCULAR | Status: DC | PRN
  Administered 2019-10-05: 4 mg via INTRAVENOUS

## 2019-10-05 MED ORDER — PROMETHAZINE HCL 25 MG/ML IJ SOLN: 6.2500 mg | INTRAMUSCULAR | Status: DC | PRN

## 2019-10-05 MED ORDER — ONDANSETRON HCL 4 MG/2ML IJ SOLN
INTRAMUSCULAR | Status: AC
  Filled 2019-10-05: qty 8

## 2019-10-05 MED ORDER — CEFAZOLIN SODIUM-DEXTROSE 2-4 GM/100ML-% IV SOLN
INTRAVENOUS | Status: AC
  Filled 2019-10-05: qty 200

## 2019-10-05 MED ORDER — MIDAZOLAM HCL 2 MG/2ML IJ SOLN
INTRAMUSCULAR | Status: AC
  Filled 2019-10-05: qty 2

## 2019-10-05 MED ORDER — EPHEDRINE 5 MG/ML INJ
INTRAVENOUS | Status: AC
  Filled 2019-10-05: qty 10

## 2019-10-05 MED ORDER — FENTANYL CITRATE (PF) 100 MCG/2ML IJ SOLN: 25.0000 ug | INTRAMUSCULAR | Status: DC | PRN

## 2019-10-05 MED ORDER — GABAPENTIN 300 MG PO CAPS
ORAL_CAPSULE | ORAL | Status: AC
  Filled 2019-10-05: qty 1

## 2019-10-05 MED ORDER — FENTANYL CITRATE (PF) 100 MCG/2ML IJ SOLN: 50.0000 ug | INTRAMUSCULAR | Status: DC | PRN

## 2019-10-05 MED ORDER — ACETAMINOPHEN 500 MG PO TABS
1000.0000 mg | ORAL_TABLET | ORAL | Status: AC
  Administered 2019-10-05: 1000 mg via ORAL

## 2019-10-05 MED ORDER — OXYCODONE HCL 5 MG PO TABS: 5.0000 mg | ORAL_TABLET | Freq: Once | ORAL | Status: DC | PRN

## 2019-10-05 MED ORDER — SUCCINYLCHOLINE CHLORIDE 200 MG/10ML IV SOSY
PREFILLED_SYRINGE | INTRAVENOUS | Status: AC
  Filled 2019-10-05: qty 20

## 2019-10-05 MED ORDER — PROPOFOL 500 MG/50ML IV EMUL
INTRAVENOUS | Status: AC
  Filled 2019-10-05: qty 50

## 2019-10-05 MED ORDER — DEXTROSE 5 % IV SOLN
3.0000 g | INTRAVENOUS | Status: AC
  Administered 2019-10-05: 13:00:00 3 g via INTRAVENOUS
  Filled 2019-10-05: qty 3000

## 2019-10-05 MED FILL — oxyCODONE HCL 5 MG TABS: 5 | 7 days supply | Qty: 30 | Fill #0

## 2019-10-05 SURGICAL SUPPLY — 47 items
BLADE CLIPPER SURG (BLADE) IMPLANT
BLADE SURG 15 STRL LF DISP TIS (BLADE) ×1 IMPLANT
BLADE SURG 15 STRL SS (BLADE) ×3
BNDG ELASTIC 3X5.8 VLCR STR LF (GAUZE/BANDAGES/DRESSINGS) IMPLANT
BNDG ELASTIC 4X5.8 VLCR STR LF (GAUZE/BANDAGES/DRESSINGS) IMPLANT
CANISTER SUCT 1200ML W/VALVE (MISCELLANEOUS) IMPLANT
CHLORAPREP W/TINT 26 (MISCELLANEOUS) ×3 IMPLANT
COVER BACK TABLE 60X90IN (DRAPES) ×3 IMPLANT
COVER MAYO STAND STRL (DRAPES) ×3 IMPLANT
COVER WAND RF STERILE (DRAPES) IMPLANT
DECANTER SPIKE VIAL GLASS SM (MISCELLANEOUS) IMPLANT
DERMABOND ADVANCED (GAUZE/BANDAGES/DRESSINGS) ×2
DERMABOND ADVANCED .7 DNX12 (GAUZE/BANDAGES/DRESSINGS) ×1 IMPLANT
DRAPE LAPAROTOMY 100X72 PEDS (DRAPES) ×3 IMPLANT
DRAPE UTILITY XL STRL (DRAPES) ×3 IMPLANT
DRSG COVADERM PLUS 2X2 (GAUZE/BANDAGES/DRESSINGS) ×2 IMPLANT
ELECT REM PT RETURN 9FT ADLT (ELECTROSURGICAL) ×3
ELECTRODE REM PT RTRN 9FT ADLT (ELECTROSURGICAL) ×1 IMPLANT
GLOVE BIO SURGEON STRL SZ 6.5 (GLOVE) ×1 IMPLANT
GLOVE BIO SURGEONS STRL SZ 6.5 (GLOVE) ×1
GLOVE BIOGEL PI IND STRL 7.0 (GLOVE) IMPLANT
GLOVE BIOGEL PI INDICATOR 7.0 (GLOVE) ×6
GLOVE SURG SIGNA 7.5 PF LTX (GLOVE) ×3 IMPLANT
GOWN STRL REUS W/ TWL LRG LVL3 (GOWN DISPOSABLE) ×1 IMPLANT
GOWN STRL REUS W/ TWL XL LVL3 (GOWN DISPOSABLE) ×1 IMPLANT
GOWN STRL REUS W/TWL LRG LVL3 (GOWN DISPOSABLE) ×3
GOWN STRL REUS W/TWL XL LVL3 (GOWN DISPOSABLE) ×3
NDL HYPO 25X1 1.5 SAFETY (NEEDLE) ×1 IMPLANT
NEEDLE HYPO 25X1 1.5 SAFETY (NEEDLE) ×3 IMPLANT
NS IRRIG 1000ML POUR BTL (IV SOLUTION) ×2 IMPLANT
PENCIL SMOKE EVACUATOR (MISCELLANEOUS) ×3 IMPLANT
SET BASIN DAY SURGERY F.S. (CUSTOM PROCEDURE TRAY) ×3 IMPLANT
SLEEVE SCD COMPRESS KNEE MED (MISCELLANEOUS) ×2 IMPLANT
SPONGE LAP 4X18 RFD (DISPOSABLE) ×3 IMPLANT
SUT ETHILON 2 0 FS 18 (SUTURE) ×6 IMPLANT
SUT MNCRL AB 4-0 PS2 18 (SUTURE) ×2 IMPLANT
SUT VIC AB 2-0 SH 27 (SUTURE)
SUT VIC AB 2-0 SH 27XBRD (SUTURE) IMPLANT
SUT VIC AB 3-0 SH 27 (SUTURE) ×6
SUT VIC AB 3-0 SH 27X BRD (SUTURE) IMPLANT
SYR BULB EAR ULCER 3OZ GRN STR (SYRINGE) IMPLANT
SYR CONTROL 10ML LL (SYRINGE) ×3 IMPLANT
TOWEL GREEN STERILE FF (TOWEL DISPOSABLE) ×3 IMPLANT
TRAY DSU PREP LF (CUSTOM PROCEDURE TRAY) ×2 IMPLANT
TUBE CONNECTING 20'X1/4 (TUBING)
TUBE CONNECTING 20X1/4 (TUBING) IMPLANT
YANKAUER SUCT BULB TIP NO VENT (SUCTIONS) IMPLANT

## 2019-10-05 NOTE — Transfer of Care (Signed)
Immediate Anesthesia Transfer of Care Note  Patient: Sharon Fisher  Procedure(s) Performed: EXCISION POSTERIOR NECK SEBACEOUS CYST AND LEFT HIP MASS (Left Hip)  Patient Location: PACU  Anesthesia Type:General  Level of Consciousness: awake, alert  and oriented  Airway & Oxygen Therapy: Patient Spontanous Breathing and Patient connected to face mask oxygen  Post-op Assessment: Report given to RN and Post -op Vital signs reviewed and stable  Post vital signs: Reviewed and stable  Last Vitals:  Vitals Value Taken Time  BP 135/84 10/05/19 1345  Temp    Pulse 88 10/05/19 1349  Resp 19 10/05/19 1349  SpO2 100 % 10/05/19 1349  Vitals shown include unvalidated device data.  Last Pain:  Vitals:   10/05/19 1207  TempSrc: Temporal  PainSc: 0-No pain         Complications: No apparent anesthesia complications

## 2019-10-05 NOTE — Discharge Instructions (Signed)
It is okay to shower starting tomorrow  Expect some drainage from the neck incision  You may also use ice pack, Tylenol, and ibuprofen also for pain  No vigorous activity until the sutures are removed  Call the office and set up an appointment to see me for suture removal next Friday, May 14   Post Anesthesia Home Care Instructions  Activity: Get plenty of rest for the remainder of the day. A responsible individual must stay with you for 24 hours following the procedure.  For the next 24 hours, DO NOT: -Drive a car -Paediatric nurse -Drink alcoholic beverages -Take any medication unless instructed by your physician -Make any legal decisions or sign important papers.  Meals: Start with liquid foods such as gelatin or soup. Progress to regular foods as tolerated. Avoid greasy, spicy, heavy foods. If nausea and/or vomiting occur, drink only clear liquids until the nausea and/or vomiting subsides. Call your physician if vomiting continues.  Special Instructions/Symptoms: Your throat may feel dry or sore from the anesthesia or the breathing tube placed in your throat during surgery. If this causes discomfort, gargle with warm salt water. The discomfort should disappear within 24 hours.     Call your surgeon if you experience:   1.  Fever over 101.0. 2.  Inability to urinate. 3.  Nausea and/or vomiting. 4.  Extreme swelling or bruising at the surgical site. 5.  Continued bleeding from the incision. 6.  Increased pain, redness or drainage from the incision. 7.  Problems related to your pain medication. 8.  Any problems and/or concerns  May take Tylenol after 5pm today 10/05/19 May take Ibuprofen after 7pm today 10/05/19

## 2019-10-05 NOTE — Interval H&P Note (Signed)
History and Physical Interval Note: no change in H and P  10/05/2019 12:02 PM  Sharon Fisher  has presented today for surgery, with the diagnosis of CHRONICALLY INFECTED POSTERIOR NECK SEBACEOUS CYST AND LEFT HIP MASS.  The various methods of treatment have been discussed with the patient and family. After consideration of risks, benefits and other options for treatment, the patient has consented to  Procedure(s): EXCISION POSTERIOR NECK SEBACEOUS CYST AND LEFT HIP MASS (N/A) as a surgical intervention.  The patient's history has been reviewed, patient examined, no change in status, stable for surgery.  I have reviewed the patient's chart and labs.  Questions were answered to the patient's satisfaction.     Coralie Keens

## 2019-10-05 NOTE — Anesthesia Postprocedure Evaluation (Signed)
Anesthesia Post Note  Patient: Sharon Fisher  Procedure(s) Performed: EXCISION POSTERIOR NECK SEBACEOUS CYST AND LEFT HIP MASS (Left Hip)     Patient location during evaluation: PACU Anesthesia Type: General Level of consciousness: awake and alert Pain management: pain level controlled Vital Signs Assessment: post-procedure vital signs reviewed and stable Respiratory status: spontaneous breathing, nonlabored ventilation and respiratory function stable Cardiovascular status: blood pressure returned to baseline and stable Postop Assessment: no apparent nausea or vomiting Anesthetic complications: no    Last Vitals:  Vitals:   10/05/19 1400 10/05/19 1415  BP: (!) 148/87 (!) 142/84  Pulse: 87 87  Resp: 19 18  Temp:    SpO2: 100% 97%    Last Pain:  Vitals:   10/05/19 1415  TempSrc:   PainSc: 0-No pain                 Audry Pili

## 2019-10-05 NOTE — Op Note (Signed)
EXCISION POSTERIOR NECK SEBACEOUS CYST AND LEFT HIP MASS  Procedure Note  WENDI RIZZARDI 10/05/2019   Pre-op Diagnosis: CHRONICALLY INFECTED POSTERIOR NECK SEBACEOUS CYST AND LEFT HIP MASS     Post-op Diagnosis: SAME  Procedure(s): EXCISION POSTERIOR NECK SEBACEOUS CYST ( 2 CM) AND EXCISION OF LEFT HIP MASS (10 CM)  Surgeon(s): Coralie Keens, MD  Anesthesia: General  Staff:  Circulator: Ted Mcalpine, RN Relief Circulator: Maurene Capes, RN Scrub Person: Flavia Shipper, CST OR Clinical Technician: Adelene Amas  Estimated Blood Loss: Minimal               Findings: The patient had a 2 cm chronically infected sebaceous cyst on her posterior neck and a 10 cm subcutaneous lipoma on her left hip area  Procedure: The patient was brought to the operating room and identifies correct patient.  She is placed upon the operating table and general anesthesia was induced.  She was then moved into the right lateral decubitus position.  Her left hip and posterior neck were then prepped and draped in usual sterile fashion.  I anesthetized the skin over the palpable mass with Marcaine.  I made a longitudinal incision with a scalpel.  I then dissected into the subcutaneous tissue electrocautery.  The patient had a large, lobulated lipoma.  I excised it in its entirety taken down several attachments circumferentially with electrocautery.  Once it was removed, I achieved hemostasis with the cautery.  I then closed the deep subcutaneous tissue and upper subcutaneous tissue with interrupted 3-0 Vicryl sutures and then closed the skin with a running 4-0 Monocryl.  Dermabond was then applied.  I next anesthetized the posterior neck.  I performed elliptical incision around the sebaceous cyst excising 2 cm of the skin and cyst.  I completed the excision with electrocautery removing all of what appeared to be the capsule of the cyst.  I then reapproximated the skin with interrupted 2-0 nylon sutures.   The patient tolerated the procedure well.  All counts were correct at the end of the procedure.  The patient was then placed back into the supine position, was extubated in the operating room, and taken in stable condition to the recovery room.          Coralie Keens   Date: 10/05/2019  Time: 1:31 PM

## 2019-10-05 NOTE — Anesthesia Procedure Notes (Signed)
Procedure Name: Intubation Date/Time: 10/05/2019 12:36 PM Performed by: Lavonia Dana, CRNA Pre-anesthesia Checklist: Patient identified, Emergency Drugs available, Suction available and Patient being monitored Patient Re-evaluated:Patient Re-evaluated prior to induction Oxygen Delivery Method: Circle system utilized Preoxygenation: Pre-oxygenation with 100% oxygen Induction Type: IV induction Laryngoscope Size: Mac and 4 Grade View: Grade II Tube type: Oral Tube size: 7.0 mm Number of attempts: 1 Airway Equipment and Method: Stylet and Patient positioned with wedge pillow Placement Confirmation: ETT inserted through vocal cords under direct vision,  positive ETCO2 and breath sounds checked- equal and bilateral Secured at: 22 cm Tube secured with: Tape Dental Injury: Teeth and Oropharynx as per pre-operative assessment

## 2019-10-05 NOTE — Anesthesia Preprocedure Evaluation (Addendum)
Anesthesia Evaluation  Patient identified by MRN, date of birth, ID band Patient awake    Reviewed: Allergy & Precautions, NPO status , Patient's Chart, lab work & pertinent test results  History of Anesthesia Complications Negative for: history of anesthetic complications  Airway Mallampati: II  TM Distance: >3 FB Neck ROM: Full    Dental  (+) Dental Advisory Given, Teeth Intact   Pulmonary former smoker,    Pulmonary exam normal        Cardiovascular hypertension, Pt. on medications Normal cardiovascular exam     Neuro/Psych PSYCHIATRIC DISORDERS Anxiety negative neurological ROS     GI/Hepatic Neg liver ROS, GERD  Controlled,  Endo/Other  diabetes, Type 2, Oral Hypoglycemic AgentsMorbid obesity  Renal/GU negative Renal ROS     Musculoskeletal  (+) Arthritis ,  Inflammatory polyarthropathy    Abdominal (+) + obese,   Peds  Hematology negative hematology ROS (+)   Anesthesia Other Findings Covid neg 4/30   Reproductive/Obstetrics                            Anesthesia Physical Anesthesia Plan  ASA: III  Anesthesia Plan: General   Post-op Pain Management:    Induction: Intravenous  PONV Risk Score and Plan: 3 and Treatment may vary due to age or medical condition, Ondansetron, Dexamethasone and Midazolam  Airway Management Planned: Oral ETT  Additional Equipment: None  Intra-op Plan:   Post-operative Plan: Extubation in OR  Informed Consent: I have reviewed the patients History and Physical, chart, labs and discussed the procedure including the risks, benefits and alternatives for the proposed anesthesia with the patient or authorized representative who has indicated his/her understanding and acceptance.     Dental advisory given  Plan Discussed with: CRNA and Anesthesiologist  Anesthesia Plan Comments:        Anesthesia Quick Evaluation

## 2019-10-06 ENCOUNTER — Encounter: Payer: Self-pay | Admitting: *Deleted

## 2019-10-17 ENCOUNTER — Other Ambulatory Visit (HOSPITAL_COMMUNITY): Payer: BC Managed Care – PPO

## 2019-11-12 ENCOUNTER — Other Ambulatory Visit: Payer: Self-pay | Admitting: Internal Medicine

## 2019-11-12 DIAGNOSIS — E1142 Type 2 diabetes mellitus with diabetic polyneuropathy: Secondary | ICD-10-CM

## 2020-02-02 ENCOUNTER — Other Ambulatory Visit: Payer: Self-pay | Admitting: Student

## 2020-02-02 DIAGNOSIS — IMO0002 Reserved for concepts with insufficient information to code with codable children: Secondary | ICD-10-CM

## 2020-02-02 MED FILL — ROSUVASTATIN CALCIUM 10 MG: 10 | 30 days supply | Qty: 30 | Fill #3

## 2020-02-02 MED FILL — RYBELSUS 7 MG TABS: 7 | 90 days supply | Qty: 90 | Fill #1

## 2020-02-02 MED FILL — metFORMIN HCL 1000 MG TABS: 1000 | 90 days supply | Qty: 180 | Fill #0

## 2020-02-02 MED FILL — glipiZIDE 5 MG TABS: 5 | 90 days supply | Qty: 180 | Fill #0

## 2020-02-02 MED FILL — ENALAPRIL MALEATE 20 MG TAB: 20 | 90 days supply | Qty: 90 | Fill #3

## 2020-02-22 MED FILL — RYBELSUS 7 MG TABS: 7 | 90 days supply | Qty: 90 | Fill #1

## 2020-03-09 ENCOUNTER — Other Ambulatory Visit: Payer: Self-pay

## 2020-03-09 DIAGNOSIS — B379 Candidiasis, unspecified: Secondary | ICD-10-CM

## 2020-03-10 ENCOUNTER — Telehealth: Payer: Self-pay

## 2020-03-10 ENCOUNTER — Other Ambulatory Visit: Payer: Self-pay | Admitting: Obstetrics & Gynecology

## 2020-03-10 MED ORDER — FLUCONAZOLE 150 MG PO TABS: 150.0000 mg | ORAL_TABLET | Freq: Once | ORAL | 0 refills | Status: DC

## 2020-03-10 MED FILL — FLUCONAZOLE 150 MG TABS: 150 | 1 days supply | Qty: 1 | Fill #0

## 2020-03-10 NOTE — Telephone Encounter (Signed)
Pt called stating having symptoms of Yeast Infection, irritation & D/C. Pt has chronic Yeast Infection, so sent Rx for Diflucan. Advised if does not clear up to call back to make an appointment. Pt verbalized understanding.

## 2020-04-05 MED FILL — ROSUVASTATIN CALCIUM 10 MG: 10 | 30 days supply | Qty: 30 | Fill #4

## 2020-05-02 ENCOUNTER — Other Ambulatory Visit: Payer: Self-pay | Admitting: Internal Medicine

## 2020-05-02 ENCOUNTER — Telehealth: Payer: Self-pay | Admitting: Student

## 2020-05-02 DIAGNOSIS — I10 Essential (primary) hypertension: Secondary | ICD-10-CM

## 2020-05-02 MED FILL — ROSUVASTATIN CALCIUM 10 MG: 10 | 30 days supply | Qty: 30 | Fill #5

## 2020-05-02 MED FILL — glipiZIDE 5 MG TABS: 5 | 90 days supply | Qty: 180 | Fill #0

## 2020-05-02 MED FILL — ENALAPRIL MALEATE 20 MG TAB: 20 | 90 days supply | Qty: 90 | Fill #0

## 2020-05-02 MED FILL — METFORMIN HCL 1000 MG TABS: 1000 | 90 days supply | Qty: 180 | Fill #0

## 2020-05-10 NOTE — Telephone Encounter (Signed)
Just spoke with patient.  She agreed to an appointment on 05/23/20 at 3:45 pm with Dr. Wynetta Emery.  Forwarding back to [employer].

## 2020-05-12 ENCOUNTER — Other Ambulatory Visit (HOSPITAL_COMMUNITY): Payer: Self-pay | Admitting: Student

## 2020-05-12 DIAGNOSIS — Z09 Encounter for follow-up examination after completed treatment for conditions other than malignant neoplasm: Secondary | ICD-10-CM | POA: Diagnosis not present

## 2020-05-12 MED FILL — oxyCODONE HCL 5 MG TABS: 5 | 2 days supply | Qty: 5 | Fill #0

## 2020-05-13 ENCOUNTER — Other Ambulatory Visit (HOSPITAL_COMMUNITY): Payer: Self-pay | Admitting: Student

## 2020-05-13 MED FILL — DOXYCYCLINE HYCLATE 100 MG: 100 | 7 days supply | Qty: 14 | Fill #0

## 2020-05-17 ENCOUNTER — Other Ambulatory Visit (HOSPITAL_COMMUNITY): Payer: Self-pay | Admitting: Student

## 2020-05-17 MED FILL — oxyCODONE HCL 5 MG TABS: 5 | 3 days supply | Qty: 10 | Fill #0

## 2020-05-23 ENCOUNTER — Ambulatory Visit (INDEPENDENT_AMBULATORY_CARE_PROVIDER_SITE_OTHER): Payer: BC Managed Care – PPO | Admitting: Student

## 2020-05-23 ENCOUNTER — Other Ambulatory Visit: Payer: Self-pay | Admitting: Student

## 2020-05-23 ENCOUNTER — Other Ambulatory Visit: Payer: Self-pay

## 2020-05-23 ENCOUNTER — Encounter: Payer: Self-pay | Admitting: Student

## 2020-05-23 VITALS — BP 139/79 | HR 86 | Temp 98.3°F | Ht 65.0 in | Wt 289.7 lb

## 2020-05-23 DIAGNOSIS — Z0000 Encounter for general adult medical examination without abnormal findings: Secondary | ICD-10-CM

## 2020-05-23 DIAGNOSIS — N898 Other specified noninflammatory disorders of vagina: Secondary | ICD-10-CM | POA: Diagnosis not present

## 2020-05-23 DIAGNOSIS — B373 Candidiasis of vulva and vagina: Secondary | ICD-10-CM

## 2020-05-23 DIAGNOSIS — E1142 Type 2 diabetes mellitus with diabetic polyneuropathy: Secondary | ICD-10-CM

## 2020-05-23 DIAGNOSIS — I10 Essential (primary) hypertension: Secondary | ICD-10-CM

## 2020-05-23 DIAGNOSIS — B3731 Acute candidiasis of vulva and vagina: Secondary | ICD-10-CM

## 2020-05-23 LAB — POCT GLYCOSYLATED HEMOGLOBIN (HGB A1C): Hemoglobin A1C: 7.7 % — AB (ref 4.0–5.6)

## 2020-05-23 LAB — GLUCOSE, CAPILLARY: Glucose-Capillary: 235 mg/dL — ABNORMAL HIGH (ref 70–99)

## 2020-05-23 MED ORDER — CHLORTHALIDONE 25 MG PO TABS: 25.0000 mg | ORAL_TABLET | Freq: Every day | ORAL | 0 refills | Status: DC

## 2020-05-23 MED ORDER — FLUCONAZOLE 150 MG PO TABS: 150.0000 mg | ORAL_TABLET | ORAL | 0 refills | Status: DC

## 2020-05-23 MED ORDER — LISINOPRIL 20 MG PO TABS: 20.0000 mg | ORAL_TABLET | Freq: Every day | ORAL | 3 refills | Status: DC

## 2020-05-23 MED ORDER — RYBELSUS 14 MG PO TABS: 1.0000 | ORAL_TABLET | Freq: Every day | ORAL | 0 refills | Status: DC

## 2020-05-23 MED FILL — FLUCONAZOLE 150 MG TABS: 150 | 3 days supply | Qty: 2 | Fill #0

## 2020-05-23 MED FILL — LISINOPRIL 20 MG TABLET: 20 | 90 days supply | Qty: 90 | Fill #0

## 2020-05-23 MED FILL — CHLORTHALIDONE 25 MG TABS: 25 | 30 days supply | Qty: 30 | Fill #0

## 2020-05-23 MED FILL — RYBELSUS 14 MG TABS: 14 | 90 days supply | Qty: 90 | Fill #0

## 2020-05-23 NOTE — Assessment & Plan Note (Signed)
Patient's blood pressure elevated to 157/85 then 139/79. Current regimen includes enalapril 20mg  daily. Patient concerned about cost of her medication and requests help with lower cost options. Patient denies chest pain, headaches, shortness of breath. -Switch enalapril to lisinopril 20mg  daily -Start chlorthalidone 25mg  daily -Check BMP at follow-up visit in a few weeks -Advised to check blood pressure at home

## 2020-05-23 NOTE — Assessment & Plan Note (Addendum)
Patient's hemoglobin A1c today mildly elevated to 7.7 from prior of 7.2 in June of 2020. Patient has been taking metformin 1000mg  twice daily, glipizide 5mg  twice daily and semaglutide 7mg  daily. She reports dietary indiscretion as the cause of her recent elevation in her HbA1c around the holidays. She would like to be below a hemoglobin of 7 and also would like to lose some weight. -Increase semaglutide to 14mg  daily -Continue metformin 1000mg  twice daily -Continue glipizide 5mg  twice daily -Continue checking blood sugars for next few weeks and bring log to next visit -Advised to complete eye exam at [facility] -HbA1c in three months

## 2020-05-23 NOTE — Progress Notes (Signed)
   CC: Follow-up  HPI:  Sharon Fisher is a 49 y.o. with past medical history significant for HTN, T2DM, HLD, IDA, and recurrent vulvovaginal candidiasis who presents to clinic for routine follow-up. Refer to problem list for charting of this encounter.  Past Medical History:  Diagnosis Date  . Allergic rhinitis   . Diabetes mellitus   . Dizziness 06/25/2017  . Fatigue 04/15/2015  . FRACTURE, CLAVICLE, RIGHT 11/22/2008   Qualifier: Diagnosis of  By: Alfonse Alpers MD, Audelia Hives    . GERD (gastroesophageal reflux disease)   . Hip pain, bilateral    ? lipomas  . HIP PAIN, BILATERAL 04/05/2006   Annotation: since 1999 Qualifier: Diagnosis of  By: Conception Chancy MD, Hinton Dyer    . Hypertension   . Irregular menstruation   . Left-sided back pain 04/15/2015  . Lipoma of other skin and subcutaneous tissue 05/05/2008   Qualifier: Diagnosis of  By: Alfonse Alpers MD, Audelia Hives    . Lymphedema    chronic, since age 5  . Mammogram abnormal    06, s/p biopsy of mass and neg work up for neoplasm  . Neck pain on right side 06/25/2017  . Need for prophylactic vaccination with combined diphtheria-tetanus-pertussis (DTP) vaccine 07/15/2012  . Obesity   . Polyarthropathy    inflammatory  . Right ovarian cyst    s/p resection of r ovary 06  . RLQ PAIN 03/15/2009   Qualifier: Diagnosis of  By: Eyvonne Mechanic MD, Vijay    . Sebaceous cyst 07/15/2012  . Shoulder pain 07/26/2015  . Vaginal itching 12/27/2016  . Wears glasses    Review of Systems:  Endorses vaginal itching. Denies vaginal burning, pain, or discharge. Denies chest pain, shortness of breath, headaches, nausea, vomiting.  Physical Exam:  Vitals:   05/23/20 1525 05/23/20 1527 05/23/20 1614  BP:  (!) 157/85 139/79  Pulse:  91 86  Temp:  98.3 F (36.8 C)   TempSrc:  Oral   SpO2:  100%   Weight: 289 lb 11.2 oz (131.4 kg)    Height: 5\' 5"  (1.651 m)    Physical Exam Vitals reviewed.  Cardiovascular:     Rate and Rhythm: Normal rate and regular rhythm.     Pulses:  Normal pulses.     Heart sounds: Normal heart sounds.  Pulmonary:     Effort: Pulmonary effort is normal.     Breath sounds: Normal breath sounds.  Abdominal:     General: Abdomen is flat. Bowel sounds are normal.     Palpations: Abdomen is soft.     Tenderness: There is no abdominal tenderness.    Assessment & Plan:   See Encounters Tab for problem based charting.  Patient seen with Dr. Heber

## 2020-05-23 NOTE — Assessment & Plan Note (Signed)
Patient endorses vaginal itching for two days without significant vaginal discharge. She reports that these symptoms are consistent with prior vulvovaginal candidiasis episodes which resolved with diflucan. Vaginal examination deferred at today's visit. -Fluconazole 150mg  today and again in three days

## 2020-05-23 NOTE — Assessment & Plan Note (Signed)
Patient declined influenza vaccination today.

## 2020-05-23 NOTE — Patient Instructions (Addendum)
Sharon Fisher,  It was a pleasure meeting you in clinic today.  For your high blood pressure: we will switch your enalapril to lisinopril 20mg  daily which should be a more affordable option. Also, we will start another medication called chlorthalidone which is another affordable and common medication to help lower blood pressure. We will need to see you back in a few weeks to assess your response to this therapy and check labs.  For your diabetes: We would like for you to continue taking metformin 1000mg  twice daily, glipizide 5mg  twice daily. We want you to take a higher dose of the Rybelsus (14mg ) which should help bring down your blood glucose and also may help with weight loss.  For your vaginal itching: We will prescribe two tablets of fluconazole 150mg . You should take these two pills three days apart which should help relieve the suspected fungal infection.  For your health maintenance: I strongly recommend that you obtain the influenza vaccination and COVID-19 vaccinations as soon as possible.   Sincerely, Dr. Paulla Dolly, MD

## 2020-05-25 NOTE — Progress Notes (Signed)
Internal Medicine Clinic Attending  I saw and evaluated the patient.  I personally confirmed the key portions of the history and exam documented by Dr. Johnson and I reviewed pertinent patient test results.  The assessment, diagnosis, and plan were formulated together and I agree with the documentation in the resident's note.  

## 2020-06-02 DIAGNOSIS — L7633 Postprocedural seroma of skin and subcutaneous tissue following a dermatologic procedure: Secondary | ICD-10-CM | POA: Diagnosis not present

## 2020-08-24 ENCOUNTER — Other Ambulatory Visit: Payer: Self-pay | Admitting: Internal Medicine

## 2020-08-24 ENCOUNTER — Other Ambulatory Visit: Payer: Self-pay | Admitting: Student

## 2020-08-24 DIAGNOSIS — E1142 Type 2 diabetes mellitus with diabetic polyneuropathy: Secondary | ICD-10-CM

## 2020-08-24 MED FILL — RYBELSUS 14 MG TABS: 14 | 90 days supply | Qty: 90 | Fill #0

## 2020-08-24 MED FILL — LISINOPRIL 20 MG TABLET: 20 | 90 days supply | Qty: 90 | Fill #1

## 2020-09-21 ENCOUNTER — Telehealth: Payer: Self-pay

## 2020-09-21 NOTE — Telephone Encounter (Signed)
ERROR

## 2020-10-14 ENCOUNTER — Other Ambulatory Visit: Payer: Self-pay | Admitting: Student

## 2020-10-14 ENCOUNTER — Other Ambulatory Visit (HOSPITAL_COMMUNITY): Payer: Self-pay

## 2020-10-14 DIAGNOSIS — I10 Essential (primary) hypertension: Secondary | ICD-10-CM

## 2020-10-14 MED FILL — Glipizide Tab 5 MG: ORAL | 90 days supply | Qty: 180 | Fill #0 | Status: AC

## 2020-10-14 MED FILL — Metformin HCl Tab 1000 MG: ORAL | 90 days supply | Qty: 180 | Fill #0 | Status: AC

## 2020-11-06 ENCOUNTER — Encounter: Payer: Self-pay | Admitting: *Deleted

## 2020-12-19 NOTE — Assessment & Plan Note (Addendum)
Current medications: Lisinopril 20mg , Chlorthalidone 25mg  Adverse effects- patient has been taking chlorthalidone about once weekly due to increased dizziness with medication.  Her blood pressure at home has been in the 130s/ 70s Blood pressure is 166/81 and continued to be elevated on repeat Plan -Bmp -Discontinued Chlorthalidone 25 mg and changed lisinopril 20 mg to Zestoretic 20-12.5 mg

## 2020-12-20 NOTE — Assessment & Plan Note (Addendum)
Medications: Glipizide 5 mg qac, Metformin 1000 mg, Semaglutide 14 mg   Last A1C: Improved to 6.3 from 7.7 (12/21) with addition of Semaglutide.  Patient has also lost about 10 lbs since her visit in 12/21. A/P: -continue medications as prescribed -follow-up in 3 months for A1c -referral to opthalmology

## 2020-12-20 NOTE — Assessment & Plan Note (Addendum)
Medications: Crestor 10 mg Last Cholesterol: 143  LDL: 81 A/P: -repeat lipid panel today, will make appropriate changes after results

## 2020-12-21 ENCOUNTER — Ambulatory Visit (INDEPENDENT_AMBULATORY_CARE_PROVIDER_SITE_OTHER): Payer: BC Managed Care – PPO | Admitting: Internal Medicine

## 2020-12-21 ENCOUNTER — Encounter: Payer: Self-pay | Admitting: Internal Medicine

## 2020-12-21 ENCOUNTER — Other Ambulatory Visit (HOSPITAL_COMMUNITY): Payer: Self-pay

## 2020-12-21 ENCOUNTER — Other Ambulatory Visit: Payer: Self-pay

## 2020-12-21 ENCOUNTER — Ambulatory Visit (HOSPITAL_COMMUNITY)
Admission: RE | Admit: 2020-12-21 | Discharge: 2020-12-21 | Disposition: A | Discharge status: Home / Self Care | Payer: BC Managed Care – PPO | Source: Ambulatory Visit | Attending: Internal Medicine | Admitting: Internal Medicine

## 2020-12-21 VITALS — BP 166/81 | HR 96 | Wt 279.9 lb

## 2020-12-21 DIAGNOSIS — M25552 Pain in left hip: Secondary | ICD-10-CM | POA: Diagnosis not present

## 2020-12-21 DIAGNOSIS — E1142 Type 2 diabetes mellitus with diabetic polyneuropathy: Secondary | ICD-10-CM

## 2020-12-21 DIAGNOSIS — E1121 Type 2 diabetes mellitus with diabetic nephropathy: Secondary | ICD-10-CM

## 2020-12-21 DIAGNOSIS — E785 Hyperlipidemia, unspecified: Secondary | ICD-10-CM | POA: Diagnosis not present

## 2020-12-21 DIAGNOSIS — E1165 Type 2 diabetes mellitus with hyperglycemia: Secondary | ICD-10-CM

## 2020-12-21 DIAGNOSIS — I10 Essential (primary) hypertension: Secondary | ICD-10-CM | POA: Diagnosis not present

## 2020-12-21 DIAGNOSIS — D500 Iron deficiency anemia secondary to blood loss (chronic): Secondary | ICD-10-CM | POA: Diagnosis not present

## 2020-12-21 DIAGNOSIS — Z0000 Encounter for general adult medical examination without abnormal findings: Secondary | ICD-10-CM

## 2020-12-21 DIAGNOSIS — IMO0002 Reserved for concepts with insufficient information to code with codable children: Secondary | ICD-10-CM

## 2020-12-21 DIAGNOSIS — M25559 Pain in unspecified hip: Secondary | ICD-10-CM

## 2020-12-21 DIAGNOSIS — Z86018 Personal history of other benign neoplasm: Secondary | ICD-10-CM | POA: Diagnosis not present

## 2020-12-21 DIAGNOSIS — I878 Other specified disorders of veins: Secondary | ICD-10-CM | POA: Diagnosis not present

## 2020-12-21 DIAGNOSIS — F419 Anxiety disorder, unspecified: Secondary | ICD-10-CM

## 2020-12-21 DIAGNOSIS — Z1211 Encounter for screening for malignant neoplasm of colon: Secondary | ICD-10-CM | POA: Diagnosis not present

## 2020-12-21 LAB — GLUCOSE, CAPILLARY: Glucose-Capillary: 126 mg/dL — ABNORMAL HIGH (ref 70–99)

## 2020-12-21 LAB — POCT GLYCOSYLATED HEMOGLOBIN (HGB A1C): Hemoglobin A1C: 6.3 % — AB (ref 4.0–5.6)

## 2020-12-21 MED ORDER — GLIPIZIDE 5 MG PO TABS
5.0000 mg | ORAL_TABLET | Freq: Every day | ORAL | 1 refills | Status: DC
  Filled 2020-12-21 – 2021-03-07 (×2): qty 90, 90d supply, fill #0

## 2020-12-21 MED ORDER — SEMAGLUTIDE 14 MG PO TABS
1.0000 | ORAL_TABLET | Freq: Every day | ORAL | 0 refills | Status: DC
  Filled 2020-12-21: qty 90, 90d supply, fill #0

## 2020-12-21 NOTE — Progress Notes (Signed)
   CC: follow-up for diabetes and increased hip pain  HPI:  Sharon Fisher is a 50 y.o. with medical history as below presenting to [facility] for follow-up of her diabetes as well as increased hip pain.  Please see problem-based list for further details, assessments, and plans.  Past Medical History:  Diagnosis Date   Allergic rhinitis    Diabetes mellitus    Dizziness 06/25/2017   Fatigue 04/15/2015   FRACTURE, CLAVICLE, RIGHT 11/22/2008   Qualifier: Diagnosis of  By: Alfonse Alpers MD, Audelia Hives     GERD (gastroesophageal reflux disease)    Hip pain, bilateral    ? lipomas   HIP PAIN, BILATERAL 04/05/2006   Annotation: since 1999 Qualifier: Diagnosis of  By: Conception Chancy MD, Hinton Dyer     Hypertension    Irregular menstruation    Left-sided back pain 04/15/2015   Lipoma of other skin and subcutaneous tissue 05/05/2008   Qualifier: Diagnosis of  By: Alfonse Alpers MD, Ruben     Lymphedema    chronic, since age 11   Mammogram abnormal    06, s/p biopsy of mass and neg work up for neoplasm   Neck pain on right side 06/25/2017   Need for prophylactic vaccination with combined diphtheria-tetanus-pertussis (DTP) vaccine 07/15/2012   Obesity    Polyarthropathy    inflammatory   Right ovarian cyst    s/p resection of r ovary 06   RLQ PAIN 03/15/2009   Qualifier: Diagnosis of  By: Eyvonne Mechanic MD, Vijay     Sebaceous cyst 07/15/2012   Shoulder pain 07/26/2015   Vaginal itching 12/27/2016   Wears glasses    Review of Systems:  As per HPI  Physical Exam:  Vitals:   12/21/20 1315  BP: (!) 166/81  Pulse: 96  SpO2: 98%  Weight: 279 lb 14.4 oz (127 kg)    General: well-developed, obese  HENT: NCAT, no scars, increased pigmentation to anterior neck Eyes: conjunctiva clear, no scleral icterus CV: normal rate, no murmurs Pulm: CTAB, pulmonary effort normal GI: no tenderness, normal bowel sounds MSK: decreased ROM in left hip in abduction and adduction. Skin: acanthosis nigricans at axilla bilaterally,  hyperpigmentation to anterior neck Neuro: vibratory sensation intact bilaterally in L5 and S1, antalgic gait due to hip Psych: normal mood and affect  Assessment & Plan:   See Encounters Tab for problem based charting.  Patient seen with Dr.  Jimmye Norman

## 2020-12-21 NOTE — Patient Instructions (Addendum)
Sharon Fisher, it was a pleasure seeing you today!  Today we discussed: Hip pain- we are getting an xray today of your left hip, will follow-up after getting those results  Diabetes- I am referring you to an ophthalmologist due to your history of diabetes. Your A1c was 6.3, which is excellent! We will continue Metformin 1000 mg BID and Rybelsus 14 mg once daily.  We are decreasing your glipizide 5 mg to once a day.  Hypertension- First we are getting labs to check on your kidneys. Then if kidney lab values are normal we will increase the Lisinopril to 40 mg once a day and discontinuing the Chlorthalidone completely. Please continue to monitor your blood pressure at home.   High Cholesterol- We are rechecking your cholesterol today. I will call and update you on results, for now continue with the Crestor 20mg   I have ordered the following labs today:  Lab Orders  Lipid Profile  BMP8+Anion Gap  Glucose, capillary  CBC no Diff  POC Hbg A1C    Tests ordered today:  A1c, BMP, Lipid panel, CBC. X-ray hip  Referrals ordered today:   Referral Orders  Ambulatory referral to Gastroenterology  Ambulatory referral to Ophthalmology     I have ordered the following medication/changed the following medications:   Stop the following medications: Medications Discontinued During This Encounter  Medication Reason   glipiZIDE (GLUCOTROL) 5 MG tablet    chlorthalidone (HYGROTON) 25 MG tablet Discontinued by provider   Semaglutide 14 MG TABS Reorder     Start the following medications: Meds ordered this encounter  Medications   glipiZIDE (GLUCOTROL) 5 MG tablet    Sig: Take 1 tablet (5 mg total) by mouth daily before breakfast.    Dispense:  180 tablet    Refill:  1   Semaglutide 14 MG TABS    Sig: TAKE 1 TABLET BY MOUTH DAILY.    Dispense:  90 tablet    Refill:  0     Follow-up: 3 months   Please make sure to arrive 15 minutes prior to your next appointment. If you arrive  late, you may be asked to reschedule.   We look forward to seeing you next time. Please call our clinic at [phone] if you have any questions or concerns. The best time to call is Monday-Friday from 9am-4pm, but there is someone available 24/7. If after hours or the weekend, call the main hospital number and ask for the Internal Medicine Resident On-Call. If you need medication refills, please notify your pharmacy one week in advance and they will send [place] a request.  Thank you for letting us take part in your care. Wishing you the best!  Thank you, Dr. Howie Ill

## 2020-12-22 ENCOUNTER — Other Ambulatory Visit: Payer: Self-pay | Admitting: Internal Medicine

## 2020-12-22 ENCOUNTER — Other Ambulatory Visit (HOSPITAL_COMMUNITY): Payer: Self-pay

## 2020-12-22 LAB — BMP8+ANION GAP
Anion Gap: 14 mmol/L (ref 10.0–18.0)
BUN/Creatinine Ratio: 8 — ABNORMAL LOW (ref 9–23)
BUN: 5 mg/dL — ABNORMAL LOW (ref 6–24)
CO2: 26 mmol/L (ref 20–29)
Calcium: 9.1 mg/dL (ref 8.7–10.2)
Chloride: 101 mmol/L (ref 96–106)
Creatinine, Ser: 0.64 mg/dL (ref 0.57–1.00)
Glucose: 118 mg/dL — ABNORMAL HIGH (ref 65–99)
Potassium: 4.1 mmol/L (ref 3.5–5.2)
Sodium: 141 mmol/L (ref 134–144)
eGFR: 107 mL/min/{1.73_m2} (ref >59)

## 2020-12-22 LAB — LIPID PANEL
Chol/HDL Ratio: 4.3 ratio (ref 0.0–4.4)
Cholesterol, Total: 163 mg/dL (ref 100–199)
HDL: 38 mg/dL — ABNORMAL LOW (ref >39)
LDL Chol Calc (NIH): 110 mg/dL — ABNORMAL HIGH (ref 0–99)
Triglycerides: 81 mg/dL (ref 0–149)
VLDL Cholesterol Cal: 15 mg/dL (ref 5–40)

## 2020-12-22 LAB — CBC
Hematocrit: 37.1 % (ref 34.0–46.6)
Hemoglobin: 12.7 g/dL (ref 11.1–15.9)
MCH: 31.8 pg (ref 26.6–33.0)
MCHC: 34.2 g/dL (ref 31.5–35.7)
MCV: 93 fL (ref 79–97)
Platelets: 301 10*3/uL (ref 150–450)
RBC: 4 x10E6/uL (ref 3.77–5.28)
RDW: 14.3 % (ref 11.7–15.4)
WBC: 7.8 10*3/uL (ref 3.4–10.8)

## 2020-12-22 MED ORDER — ROSUVASTATIN CALCIUM 20 MG PO TABS
20.0000 mg | ORAL_TABLET | Freq: Every day | ORAL | 11 refills | Status: DC
Start: 2020-12-22 — End: 2022-01-29
  Filled 2020-12-22: qty 30, 30d supply, fill #0
  Filled 2021-01-30: qty 30, 30d supply, fill #1
  Filled 2021-03-07: qty 30, 30d supply, fill #2
  Filled 2021-04-17: qty 30, 30d supply, fill #3
  Filled 2021-05-26: qty 30, 30d supply, fill #4
  Filled 2021-07-07: qty 30, 30d supply, fill #5
  Filled 2021-08-26: qty 30, 30d supply, fill #6
  Filled 2021-10-12: qty 30, 30d supply, fill #7
  Filled 2021-12-09: qty 30, 30d supply, fill #8

## 2020-12-22 MED ORDER — BENAZEPRIL-HYDROCHLOROTHIAZIDE 20-12.5 MG PO TABS
1.0000 | ORAL_TABLET | Freq: Every day | ORAL | 11 refills | Status: DC
  Filled 2020-12-22: qty 30, 30d supply, fill #0

## 2020-12-22 MED ORDER — LISINOPRIL-HYDROCHLOROTHIAZIDE 20-12.5 MG PO TABS
1.0000 | ORAL_TABLET | Freq: Every day | ORAL | 11 refills | Status: DC
  Filled 2020-12-22: qty 30, 30d supply, fill #0
  Filled 2021-01-30: qty 30, 30d supply, fill #1
  Filled 2021-03-07: qty 30, 30d supply, fill #2
  Filled 2021-04-17: qty 30, 30d supply, fill #3
  Filled 2021-05-26: qty 30, 30d supply, fill #4
  Filled 2021-07-07: qty 30, 30d supply, fill #5
  Filled 2021-08-26: qty 30, 30d supply, fill #6
  Filled 2021-10-12: qty 30, 30d supply, fill #7
  Filled 2021-12-09: qty 30, 30d supply, fill #8

## 2020-12-22 NOTE — Assessment & Plan Note (Signed)
-  Referral placed to GI for colonoscopy.

## 2020-12-22 NOTE — Addendum Note (Signed)
Addended by: Edwyna Perfect on: 12/22/2020 05:28 PM   Modules accepted: Orders

## 2020-12-22 NOTE — Assessment & Plan Note (Signed)
Patient requested to see a therapist for increased anxiety. PHQ-9 score= 8 -referral to behavioral health

## 2020-12-22 NOTE — Assessment & Plan Note (Addendum)
Patient has a history of lipoma removal at her left hip in 05/012021.  She states that she had to have fluid drained from the area three times after the surgery.  Following that, she's had marked increase in hip pain, mostly at night.  The pain prevents her from sleeping and decreases her activity level during the day.  She states that she never uses her left leg to step up on things.   A/P -Hip x-ray showed no acute osseous abnormalities. Will refer to sports medicine clinic for posible steroid injection. -Ibuprofen 800 mg PRN ordered to help patient with pain until she can get to her sport medicine appointment

## 2020-12-22 NOTE — Addendum Note (Signed)
Addended by: Edwyna Perfect on: 12/22/2020 03:23 PM   Modules accepted: Orders

## 2020-12-22 NOTE — Addendum Note (Signed)
Addended by: Edwyna Perfect on: 12/22/2020 03:28 PM   Modules accepted: Orders

## 2020-12-23 ENCOUNTER — Other Ambulatory Visit (HOSPITAL_COMMUNITY): Payer: Self-pay

## 2020-12-23 MED ORDER — IBUPROFEN 800 MG PO TABS
800.0000 mg | ORAL_TABLET | Freq: Three times a day (TID) | ORAL | 0 refills | Status: DC | PRN
  Filled 2020-12-23: qty 30, 10d supply, fill #0

## 2020-12-23 NOTE — Addendum Note (Signed)
Addended by: Edwyna Perfect on: 12/23/2020 09:18 AM   Modules accepted: Orders

## 2021-01-02 ENCOUNTER — Ambulatory Visit: Payer: BC Managed Care – PPO | Admitting: Family Medicine

## 2021-01-02 ENCOUNTER — Other Ambulatory Visit: Payer: Self-pay

## 2021-01-02 ENCOUNTER — Encounter: Payer: Self-pay | Admitting: Family Medicine

## 2021-01-02 VITALS — BP 122/72 | Ht 65.0 in | Wt 276.0 lb

## 2021-01-02 DIAGNOSIS — M25552 Pain in left hip: Secondary | ICD-10-CM | POA: Diagnosis not present

## 2021-01-02 DIAGNOSIS — M533 Sacrococcygeal disorders, not elsewhere classified: Secondary | ICD-10-CM

## 2021-01-02 NOTE — Patient Instructions (Signed)
Your hip pain is primarily due to SI joint dysfunction though you also have gluteal tendinopathy and IT band syndrome. Start physical therapy and do home exercises on days you don't go to therapy. Voltaren gel up to 4 times a day may help with pain as well as tylenol. We can consider injection (SI joint) or chiropractic care depending on your improvement. Heat 15 minutes at a time 3-4 times a day if needed. Follow up with me in 6 weeks for reevaluation.
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
## 2021-01-05 ENCOUNTER — Telehealth: Payer: Self-pay | Admitting: Internal Medicine

## 2021-01-05 NOTE — Telephone Encounter (Signed)
Call to inform the patient regarding her lab results.  Patient states that Dr. Eulas Post has previously called the patient and informed her of her results.  Particularly, her cholesterol is significantly elevated and her ASCVD was calculated at 9.5%.  The patient states that Dr. Howie Ill restarted her Crestor 10 mg.  Could consider titrating this up to 20 mg for improved LDL response.  Additionally, the patient states that since starting the lisinopril hydrochlorothiazide for her hypertension, she has noticed feeling hazy in the morning.  She reports having to get up multiple times at night to urinate.  Therefore, I counseled her regarding taking her medication first thing in the morning to see if this would improve her rest and limit her haziness.  I told her if this does not improve in the next 2 to 3 weeks would like to see her back within the month to further evaluate her symptoms.  Patient admits understanding.  Lawerance Cruel, D.O.  Internal Medicine Resident, PGY-3 [facility]  Pager: [phone] 9:23 AM, 01/05/2021   **Please contact the on call pager after 5 pm and on weekends at [phone].**

## 2021-01-10 ENCOUNTER — Ambulatory Visit: Payer: BC Managed Care – PPO | Attending: Family Medicine

## 2021-01-30 ENCOUNTER — Other Ambulatory Visit (HOSPITAL_COMMUNITY): Payer: Self-pay

## 2021-02-13 ENCOUNTER — Ambulatory Visit: Payer: BC Managed Care – PPO | Admitting: Family Medicine

## 2021-03-07 ENCOUNTER — Other Ambulatory Visit: Payer: Self-pay | Admitting: Student

## 2021-03-07 ENCOUNTER — Other Ambulatory Visit (HOSPITAL_COMMUNITY): Payer: Self-pay

## 2021-03-07 DIAGNOSIS — IMO0002 Reserved for concepts with insufficient information to code with codable children: Secondary | ICD-10-CM

## 2021-03-07 DIAGNOSIS — E1142 Type 2 diabetes mellitus with diabetic polyneuropathy: Secondary | ICD-10-CM

## 2021-03-08 ENCOUNTER — Other Ambulatory Visit (HOSPITAL_COMMUNITY): Payer: Self-pay

## 2021-03-08 MED ORDER — METFORMIN HCL 1000 MG PO TABS
1000.0000 mg | ORAL_TABLET | Freq: Two times a day (BID) | ORAL | 1 refills | Status: DC
  Filled 2021-03-08: qty 180, 90d supply, fill #0
  Filled 2021-07-07: qty 180, 90d supply, fill #1

## 2021-03-27 ENCOUNTER — Other Ambulatory Visit (HOSPITAL_COMMUNITY): Payer: Self-pay

## 2021-03-27 ENCOUNTER — Other Ambulatory Visit: Payer: Self-pay

## 2021-03-27 ENCOUNTER — Ambulatory Visit (INDEPENDENT_AMBULATORY_CARE_PROVIDER_SITE_OTHER): Payer: BC Managed Care – PPO | Admitting: Internal Medicine

## 2021-03-27 ENCOUNTER — Encounter: Payer: Self-pay | Admitting: Internal Medicine

## 2021-03-27 VITALS — BP 129/74 | HR 96 | Temp 98.6°F | Ht 65.0 in | Wt 277.3 lb

## 2021-03-27 DIAGNOSIS — I10 Essential (primary) hypertension: Secondary | ICD-10-CM

## 2021-03-27 DIAGNOSIS — E1142 Type 2 diabetes mellitus with diabetic polyneuropathy: Secondary | ICD-10-CM

## 2021-03-27 DIAGNOSIS — Z0000 Encounter for general adult medical examination without abnormal findings: Secondary | ICD-10-CM

## 2021-03-27 DIAGNOSIS — N898 Other specified noninflammatory disorders of vagina: Secondary | ICD-10-CM | POA: Diagnosis not present

## 2021-03-27 LAB — POCT GLYCOSYLATED HEMOGLOBIN (HGB A1C): Hemoglobin A1C: 7.3 % — AB (ref 4.0–5.6)

## 2021-03-27 LAB — GLUCOSE, CAPILLARY: Glucose-Capillary: 181 mg/dL — ABNORMAL HIGH (ref 70–99)

## 2021-03-27 MED ORDER — SEMAGLUTIDE 14 MG PO TABS
1.0000 | ORAL_TABLET | Freq: Every day | ORAL | 3 refills | Status: DC
  Filled 2021-03-27: qty 90, 90d supply, fill #0
  Filled 2021-07-07: qty 90, 90d supply, fill #1
  Filled 2021-10-18: qty 90, 90d supply, fill #2
  Filled 2022-01-29: qty 90, 90d supply, fill #3

## 2021-03-27 NOTE — Assessment & Plan Note (Signed)
Patient provided with phone numbers and office information for ophthalmology clinic and GI clinic for eye exam and colonoscopy. Patient declined flu shot today. Discussed importance of flu shot, COVID booster, shingles shot, and pneumococcal shot and patient will continue to consider.

## 2021-03-27 NOTE — Assessment & Plan Note (Signed)
BP today 129/74, well controlled on current regimen of zestoretic 20-12.5mg  daily. Has occasional dizziness upon standing in the mornings that self resolves.  Plan: -Continue lisinopril-HCTZ 20-12.5mg  (zestoretic)

## 2021-03-27 NOTE — Assessment & Plan Note (Addendum)
Today Hgb A1c 7.3% which is increased from 6.3% in 12/22/20. Patient is currently on Glipizide 5mg  daily, Metformin 1000mg  BID, Semaglutide 14g daily. She denies polydipsia, polyuria. She denies hypoglycemic symptoms.  She had 10lb weight loss from December 2021 to July 2022 though her weight has now plateaued in 270s over the last several months. Discussed healthy eating habits as well as increasing exercise by walking several times a week. Suspect her raise in Hgb A1c and plateau in weight loss related to diet.   Patient previously referred to ophthalmologist. Referral still active.  Plan: -Continue Glipizide 5mg  daily, Metformin 1000mg  BID, Semaglutide 14g daily -Lifestyle modifications -Attached nutrition in diabetes education to AVS  -Follow up in 3 month for Hgb A1c recheck -prev referred ophthalmology office phone number provided to schedule

## 2021-03-27 NOTE — Progress Notes (Signed)
CC: diabetes follow up, vaginal itching  HPI:  Sharon Fisher is a 50 y.o. female with a past medical history stated below and presents today for diabetes follow up appointment with new complaint of vaginal itching. Please see problem based assessment and plan for additional details.  Past Medical History:  Diagnosis Date   Allergic rhinitis    Diabetes mellitus    Dizziness 06/25/2017   Fatigue 04/15/2015   FRACTURE, CLAVICLE, RIGHT 11/22/2008   Qualifier: Diagnosis of  By: Alfonse Alpers MD, Audelia Hives     GERD (gastroesophageal reflux disease)    Hip pain, bilateral    ? lipomas   HIP PAIN, BILATERAL 04/05/2006   Annotation: since 1999 Qualifier: Diagnosis of  By: Conception Chancy MD, Hinton Dyer     Hypertension    Irregular menstruation    Left-sided back pain 04/15/2015   Lipoma of other skin and subcutaneous tissue 05/05/2008   Qualifier: Diagnosis of  By: Alfonse Alpers MD, Ruben     Lymphedema    chronic, since age 1   Mammogram abnormal    06, s/p biopsy of mass and neg work up for neoplasm   Neck pain on right side 06/25/2017   Need for prophylactic vaccination with combined diphtheria-tetanus-pertussis (DTP) vaccine 07/15/2012   Obesity    Polyarthropathy    inflammatory   Right ovarian cyst    s/p resection of r ovary 06   RLQ PAIN 03/15/2009   Qualifier: Diagnosis of  By: Eyvonne Mechanic MD, Vijay     Sebaceous cyst 07/15/2012   Shoulder pain 07/26/2015   Vaginal itching 12/27/2016   Wears glasses     Current Outpatient Medications on File Prior to Visit  Medication Sig Dispense Refill   acetaminophen (TYLENOL) 500 MG tablet Take 2 tablets (1,000 mg total) by mouth every 8 (eight) hours as needed (pain). 30 tablet 0   doxycycline (VIBRA-TABS) 100 MG tablet TAKE 1 TABLET BY MOUTH TWO TIMES DAILY 14 tablet 1   ferrous gluconate (FERGON) 324 MG tablet Take 1 tablet (324 mg total) by mouth daily with breakfast. 30 tablet 11   fluconazole (DIFLUCAN) 150 MG tablet TAKE 1 TABLET (150 MG TOTAL) BY MOUTH  EVERY 3 (THREE) DAYS. 2 tablet 0   gabapentin (NEURONTIN) 300 MG capsule Take 1 capsule (300 mg total) by mouth at bedtime as needed (neuropathy in feet). 30 capsule 5   glipiZIDE (GLUCOTROL) 5 MG tablet Take 1 tablet (5 mg total) by mouth daily before breakfast. 180 tablet 1   glucose blood (CONTOUR NEXT TEST) test strip Use up to two times per day. 100 each 4   ibuprofen (ADVIL) 800 MG tablet Take 1 tablet (800 mg total) by mouth every 8 (eight) hours as needed. 30 tablet 0   Lancets MISC Use up to two times per day 100 each 4   lisinopril-hydrochlorothiazide (ZESTORETIC) 20-12.5 MG tablet Take 1 tablet by mouth daily. 30 tablet 11   metFORMIN (GLUCOPHAGE) 1000 MG tablet Take 1 tablet (1,000 mg total) by mouth 2 (two) times daily with a meal. 180 tablet 1   rosuvastatin (CRESTOR) 20 MG tablet Take 1 tablet (20 mg total) by mouth daily. 30 tablet 11   Semaglutide 14 MG TABS Take 1 tablet by mouth daily. 90 tablet 0   [DISCONTINUED] enalapril (VASOTEC) 20 MG tablet TAKE 1 TABLET BY MOUTH DAILY. 90 tablet 3   No current facility-administered medications on file prior to visit.    Family History  Problem Relation Age of Onset   Hypertension  Mother    Cancer Mother        breast cancer   Anemia Mother    Sickle cell trait Sister    Cancer Maternal Uncle        breast cancer   Cancer Paternal Grandmother        lung cancer   Hypertension Paternal Grandmother    Diabetes Paternal Grandmother     Review of Systems: ROS negative except for what is noted on the assessment and plan.  Vitals:   03/27/21 1518  BP: 129/74  Pulse: 96  Temp: 98.6 F (37 C)  TempSrc: Oral  SpO2: 100%  Weight: 277 lb 4.8 oz (125.8 kg)  Height: 5\' 5"  (1.651 m)     Physical Exam: General: Well appearing obese african [place] female, NAD HENT: normocephalic, atraumatic EYES: conjunctiva non-erythematous, no scleral icterus CV: regular rate, normal rhythm, no murmurs, rubs, gallops. Pulmonary: normal  work of breathing on RA, lungs clear to auscultation, no rales, wheezes, rhonchi Abdominal: non-distended, soft, non-tender to palpation, normal BS Skin: Warm and dry, no rashes or lesions on visible surfaces Neurological: MS: awake, alert and oriented x3, normal speech and fund of knowledge Motor: moves all extremities antigravity Psych: normal affect    Assessment & Plan:   See Encounters Tab for problem based charting.  Patient seen with Dr. Dionicia Abler, M.D. [facility], PGY-1 Pager: [phone] Date 03/27/2021 Time 3:29 PM

## 2021-03-27 NOTE — Assessment & Plan Note (Signed)
Patient presents for diabetes follow up appointment. Also has new complaint of vaginal itching. She reports 1 week history of vaginal itching in her external genital area. Denies itching within vagina. Denies dysuria, changes in vaginal discharge (clear), spotting, abdominal pain, pelvic pain. Is still having menstrual periods, has a period once every 3-4 months. Is sexually active and reports long history of vaginal dryness, has been using lubricant for many years. Her and her husband tried new type of lube which she believes triggered itching. Tried AZO which she takes to prevent yeast infections when she starts to feels symptoms but reported this did not resolve symptoms. Since her itching began one week ago, her symptoms have slowly improved.  Patient with several previous yeast infections presents for 1wk vaginal itching without associated symptoms aside from vaginal dryness. Vaginal itching is slowly improving. Ddx includes yeast infection, contact dermatitis from lube, or vaginal atrophy. Patient declines pelvic exam today due to time constraints and needing to make her ride home. Discussed self swab today vs referral to OBGYN for pap smear and further evaluation of vaginal itching. 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 Discussed high risk HPV as a risk factor for developing cervical cancer.   Plan: -Patient instructed to schedule appointment with OBGYN for pap smear and further workup up of vaginal itching. Her daughter works at this office per patient and will help her make appointment.

## 2021-03-27 NOTE — Patient Instructions (Signed)
Thank you, [name] for allowing [place] to provide your care today. Today we discussed:  Type 2 diabetes mellitus: Today your hemoglobin A1c has increased to 7.3.  We need to try to keep good control of your blood sugars.  Please continue taking the glipizide, metformin, and semaglutide.  Please continue to be mindful of the types of foods you are consuming.  Reduce the portions of carbohydrates and increase the portions of vegetables and lean proteins in your diet.  Try to eat 3 meals a day.  Try to exercise more frequently, walking is a great exercise.  Vaginal itching: Today we discussed that you tested high risk HPV positive and you are due for Pap smear.  Given her time constraints today, we will have you follow-up with your OB/GYN for Pap smear as well as evaluation of the vaginal itching.  I am glad that the symptom is already starting to get better.  Please have your daughter contact your OB/GYN's office to make an appointment.  Colonoscopy: [facility] G[facility]enterologist in [place], [place] Address: [address], [place], [place] [address] Phone: [phone]  Eye exam: [facility] in [place], [place] in: [facility] Address: [address], [place], [place] [address] Phone: [phone]  Please call the above office to make an appointment.  I have ordered the following labs for you:   Lab Orders         Glucose, capillary         POC Hbg A1C       My Chart Access: [email or web address]  Please follow-up in 3 months for diabetes follow-up.  Please make sure to arrive 15 minutes prior to your next appointment. If you arrive late, you may be asked to reschedule.    We look forward to seeing you next time. Please call our clinic at [phone] if you have any questions or concerns. The best time to call is Monday-Friday from 9am-4pm, but there is someone  available 24/7. If after hours or the weekend, call the main hospital number and ask for the Internal Medicine Resident On-Call. If you need medication refills, please notify your pharmacy one week in advance and they will send [place] a request.   Thank you for letting us take part in your care. Wishing you the best!  Wayland Denis, MD 03/27/2021, 4:09 PM IM Resident, PGY-1

## 2021-03-28 ENCOUNTER — Other Ambulatory Visit (HOSPITAL_COMMUNITY): Payer: Self-pay

## 2021-03-29 ENCOUNTER — Other Ambulatory Visit (HOSPITAL_COMMUNITY): Payer: Self-pay

## 2021-03-29 ENCOUNTER — Other Ambulatory Visit: Payer: Self-pay

## 2021-03-29 ENCOUNTER — Telehealth: Payer: Self-pay | Admitting: *Deleted

## 2021-03-29 ENCOUNTER — Telehealth: Payer: Self-pay | Admitting: Family Medicine

## 2021-03-29 ENCOUNTER — Telehealth: Payer: Self-pay

## 2021-03-29 DIAGNOSIS — N898 Other specified noninflammatory disorders of vagina: Secondary | ICD-10-CM

## 2021-03-29 MED ORDER — FLUCONAZOLE 150 MG PO TABS
150.0000 mg | ORAL_TABLET | ORAL | 0 refills | Status: DC
  Filled 2021-03-29: qty 2, 6d supply, fill #0

## 2021-03-29 NOTE — Telephone Encounter (Signed)
Pt called front desk stating that she's having vaginal itching, called PT back, she stated has chronic yeast infection so I sent Diflucan to Pharmacy on file. Pt has Dr. Appointment with Dr. Ernestina Patches on  05/15/21.Advised if does not clear up to come in office. Pt verbalized understanding.

## 2021-03-29 NOTE — Telephone Encounter (Signed)
Patient called in stating she made first available appt with OB/GYN but it's not till 05/15/21. States vaginal itching is "so bad, I wipe too hard and now there's blood." Requesting refill on "yeast medication." Diflucan last sent Dec 2021.

## 2021-03-29 NOTE — Telephone Encounter (Signed)
Patient calling requesting a RX for Vaginal itching.

## 2021-03-30 NOTE — Telephone Encounter (Signed)
Patient called today. She called her OBGYN for earlier appointment but they were not able to schedule her earlier. Given her worsening symptoms they went ahead and prescribed the fluconazole 150mg  two doses for her. She has already taken her first dose and knows to take second dose three days later. She was encouraged to call us back if she has any further concerns.

## 2021-04-18 ENCOUNTER — Other Ambulatory Visit (HOSPITAL_COMMUNITY): Payer: Self-pay

## 2021-04-20 ENCOUNTER — Encounter: Payer: Self-pay | Admitting: Gastroenterology

## 2021-05-15 ENCOUNTER — Ambulatory Visit: Payer: BC Managed Care – PPO | Admitting: Family Medicine

## 2021-05-30 ENCOUNTER — Other Ambulatory Visit (HOSPITAL_COMMUNITY): Payer: Self-pay

## 2021-06-08 ENCOUNTER — Other Ambulatory Visit: Payer: Self-pay | Admitting: Advanced Practice Midwife

## 2021-06-08 ENCOUNTER — Other Ambulatory Visit (HOSPITAL_COMMUNITY)
Admission: RE | Admit: 2021-06-08 | Discharge: 2021-06-08 | Disposition: A | Discharge status: Home / Self Care | Payer: BC Managed Care – PPO | Source: Ambulatory Visit | Attending: Family Medicine | Admitting: Family Medicine

## 2021-06-08 ENCOUNTER — Ambulatory Visit (INDEPENDENT_AMBULATORY_CARE_PROVIDER_SITE_OTHER): Payer: BC Managed Care – PPO | Admitting: Advanced Practice Midwife

## 2021-06-08 ENCOUNTER — Other Ambulatory Visit: Payer: Self-pay

## 2021-06-08 ENCOUNTER — Other Ambulatory Visit (HOSPITAL_COMMUNITY): Payer: Self-pay

## 2021-06-08 VITALS — BP 123/71 | HR 82 | Wt 276.0 lb

## 2021-06-08 DIAGNOSIS — B977 Papillomavirus as the cause of diseases classified elsewhere: Secondary | ICD-10-CM

## 2021-06-08 DIAGNOSIS — Z01411 Encounter for gynecological examination (general) (routine) with abnormal findings: Secondary | ICD-10-CM

## 2021-06-08 DIAGNOSIS — N644 Mastodynia: Secondary | ICD-10-CM

## 2021-06-08 DIAGNOSIS — N760 Acute vaginitis: Secondary | ICD-10-CM

## 2021-06-08 DIAGNOSIS — Z1231 Encounter for screening mammogram for malignant neoplasm of breast: Secondary | ICD-10-CM

## 2021-06-08 DIAGNOSIS — Z01419 Encounter for gynecological examination (general) (routine) without abnormal findings: Secondary | ICD-10-CM

## 2021-06-08 DIAGNOSIS — B9689 Other specified bacterial agents as the cause of diseases classified elsewhere: Secondary | ICD-10-CM

## 2021-06-08 DIAGNOSIS — N898 Other specified noninflammatory disorders of vagina: Secondary | ICD-10-CM

## 2021-06-08 MED ORDER — FLUCONAZOLE 150 MG PO TABS
150.0000 mg | ORAL_TABLET | Freq: Once | ORAL | 0 refills | Status: AC
  Filled 2021-06-08 (×2): qty 2, 3d supply, fill #0

## 2021-06-08 NOTE — Progress Notes (Signed)
GYNECOLOGY ANNUAL PREVENTATIVE CARE ENCOUNTER NOTE  History:     Sharon Fisher is a 51 y.o. G110P1001 female here for a routine annual gynecologic exam.  Current complaints: patient endorses pain in her right nipple and lateral pain in her left breast. These are recurrent complaints, onset after her breast reduction, which was in 1996. She denies nipple discharge, tenderness and swelling. She states she thinks she can feel her lymph nodes and that is normal for her.   Patient also reports recurrent abnormal vaginal discharge. She denies foul smell. Female partner (husband) x 34 years. They used petroleum jelly for lubricant. Patient is perimenopausal with brief periods of bleeding every 3-4 months. She denies abnormal vaginal bleeding, discharge, pelvic pain, problems with intercourse or other gynecologic concerns.    Gynecologic History Patient's last menstrual period was 05/15/2021 (approximate). Contraception:  perimenopausal Last Pap: 02/20/2019. Result was normal with positive HRHPV Last Mammogram: 09/04/2019.  Result was normal   Obstetric History OB History  Gravida Para Term Preterm AB Living  1 1 1     1   SAB IAB Ectopic Multiple Live Births          1    # Outcome Date GA Lbr Len/2nd Weight Sex Delivery Anes PTL Lv  1 Term      Vag-Spont   LIV    Past Medical History:  Diagnosis Date   Allergic rhinitis    Diabetes mellitus    Dizziness 06/25/2017   Fatigue 04/15/2015   FRACTURE, CLAVICLE, RIGHT 11/22/2008   Qualifier: Diagnosis of  By: Alfonse Alpers MD, Ruben     GERD (gastroesophageal reflux disease)    Hip pain, bilateral    ? lipomas   HIP PAIN, BILATERAL 04/05/2006   Annotation: since 1999 Qualifier: Diagnosis of  By: Conception Chancy MD, Hinton Dyer     Hypertension    Irregular menstruation    Left-sided back pain 04/15/2015   Lipoma of other skin and subcutaneous tissue 05/05/2008   Qualifier: Diagnosis of  By: Alfonse Alpers MD, Ruben     Lymphedema    chronic, since age 54    Mammogram abnormal    06, s/p biopsy of mass and neg work up for neoplasm   Neck pain on right side 06/25/2017   Need for prophylactic vaccination with combined diphtheria-tetanus-pertussis (DTP) vaccine 07/15/2012   Obesity    Polyarthropathy    inflammatory   Right ovarian cyst    s/p resection of r ovary 06   RLQ PAIN 03/15/2009   Qualifier: Diagnosis of  By: Eyvonne Mechanic MD, Vijay     Sebaceous cyst 07/15/2012   Shoulder pain 07/26/2015   Vaginal itching 12/27/2016   Wears glasses     Past Surgical History:  Procedure Laterality Date   BREAST BIOPSY Left    BREAST REDUCTION SURGERY  1996   DILATION AND CURETTAGE OF UTERUS     MASS EXCISION N/A 02/27/2013   Procedure: CYST REMOVAL NECK;  Surgeon: Harl Bowie, MD;  Location: [facility];  Service: General;  Laterality: N/A;  Excision chronic posterior neck cyst   MASS EXCISION Left 10/05/2019   Procedure: EXCISION POSTERIOR NECK SEBACEOUS CYST AND LEFT HIP MASS;  Surgeon: Coralie Keens, MD;  Location: [facility];  Service: General;  Laterality: Left;  Hip & neck sites   [place] Bilateral 1996   surgery of fibroids     but still has fibroids   TONSILLECTOMY  Current Outpatient Medications on File Prior to Visit  Medication Sig Dispense Refill   acetaminophen (TYLENOL) 500 MG tablet Take 2 tablets (1,000 mg total) by mouth every 8 (eight) hours as needed (pain). 30 tablet 0   ferrous gluconate (FERGON) 324 MG tablet Take 1 tablet (324 mg total) by mouth daily with breakfast. 30 tablet 11   gabapentin (NEURONTIN) 300 MG capsule Take 1 capsule (300 mg total) by mouth at bedtime as needed (neuropathy in feet). 30 capsule 5   glipiZIDE (GLUCOTROL) 5 MG tablet Take 1 tablet (5 mg total) by mouth daily before breakfast. 180 tablet 1   glucose blood (CONTOUR NEXT TEST) test strip Use up to two times per day. 100 each 4   ibuprofen (ADVIL) 800 MG tablet  Take 1 tablet (800 mg total) by mouth every 8 (eight) hours as needed. 30 tablet 0   Lancets MISC Use up to two times per day 100 each 4   lisinopril-hydrochlorothiazide (ZESTORETIC) 20-12.5 MG tablet Take 1 tablet by mouth daily. 30 tablet 11   metFORMIN (GLUCOPHAGE) 1000 MG tablet Take 1 tablet (1,000 mg total) by mouth 2 (two) times daily with a meal. 180 tablet 1   rosuvastatin (CRESTOR) 20 MG tablet Take 1 tablet (20 mg total) by mouth daily. 30 tablet 11   Semaglutide 14 MG TABS Take 1 tablet by mouth daily. 90 tablet 3   [DISCONTINUED] enalapril (VASOTEC) 20 MG tablet TAKE 1 TABLET BY MOUTH DAILY. 90 tablet 3   No current facility-administered medications on file prior to visit.    Allergies  Allergen Reactions   Shellfish Allergy Anaphylaxis    Social History:  reports that she has quit smoking. She has quit using smokeless tobacco. She reports that she does not drink alcohol and does not use drugs.  Family History  Problem Relation Age of Onset   Hypertension Mother    Cancer Mother        breast cancer   Anemia Mother    Sickle cell trait Sister    Cancer Maternal Uncle        breast cancer   Cancer Paternal Grandmother        lung cancer   Hypertension Paternal Grandmother    Diabetes Paternal Grandmother     The following portions of the patient's history were reviewed and updated as appropriate: allergies, current medications, past family history, past medical history, past social history, past surgical history and problem list.  Review of Systems Pertinent items noted in HPI and remainder of comprehensive ROS otherwise negative.  Physical Exam:  BP 123/71    Pulse 82    Wt 276 lb (125.2 kg)    LMP 05/15/2021 (Approximate) Comment: periods every 3-4 months   BMI 45.93 kg/m  CONSTITUTIONAL: Well-developed, well-nourished female in no acute distress.  HENT:  Normocephalic, atraumatic, External right and left ear normal.  EYES: Conjunctivae and EOM are normal.  Pupils are equal, round, and reactive to light. No scleral icterus.  NECK: Normal range of motion, supple, no masses.  Normal thyroid.  SKIN: Skin is warm and dry. No rash noted. Not diaphoretic. No erythema. No pallor. MUSCULOSKELETAL: Normal range of motion. No tenderness.  No cyanosis, clubbing, or edema. NEUROLOGIC: Alert and oriented to person, place, and time. Normal reflexes, muscle tone coordination.  PSYCHIATRIC: Normal mood and affect. Normal behavior. Normal judgment and thought content. CARDIOVASCULAR: Normal heart rate noted, regular rhythm RESPIRATORY: Clear to auscultation bilaterally. Effort and breath sounds normal, no problems  with respiration noted. BREASTS: Symmetric in size. No masses, tenderness, skin changes, nipple drainage, or lymphadenopathy bilaterally. Performed in the presence of a chaperone. ABDOMEN: Soft, no distention noted.  No tenderness, rebound or guarding.  PELVIC: Normal appearing external genitalia and urethral meatus; normal appearing vaginal mucosa and cervix.  No abnormal vaginal discharge noted.  Pap smear obtained.  Moderate amount of thick white clumped discharge consistent with yeast. Performed in the presence of a chaperone.   Assessment and Plan:    1. Well woman exam with routine gynecological exam - No concerning findings on physical exam - Advised water-based KY jelly for lubricant   2. High risk HPV infection - Cytology - PAP( [facility])  3. Encounter for gynecological examination    4. Encounter for screening mammogram for malignant neoplasm of breast   5. Breast pain - MM Digital Diagnostic Bilat; Future  6. Vaginal discharge - Will treat presumptively for yeast per patient preference - Cervicovaginal ancillary only( [facility]) - fluconazole (DIFLUCAN) 150 MG tablet; Take 1 tablet (150 mg total) by mouth once for 1 dose. May repeat in 2 days if still having symptoms.  Dispense: 2 tablet; Refill: 0  Will follow up results of  pap smear and manage accordingly. Mammogram scheduled Routine preventative health maintenance measures emphasized. Please refer to After Visit Summary for other counseling recommendations.     Total visit time: 30 minutes. Greater than 50% of visit spent in counseling and coordination of care  Mallie Snooks, [place], MSN, CNM Certified Nurse Midwife, Product/process development scientist for [employer], [place]

## 2021-06-08 NOTE — Progress Notes (Signed)
Patient mammogram scheduled for 06/13/20 at 10:20 AM the breast center. Patient notified.

## 2021-06-09 ENCOUNTER — Ambulatory Visit (AMBULATORY_SURGERY_CENTER): Payer: Self-pay | Admitting: *Deleted

## 2021-06-09 ENCOUNTER — Other Ambulatory Visit: Payer: Self-pay

## 2021-06-09 ENCOUNTER — Other Ambulatory Visit (HOSPITAL_COMMUNITY): Payer: Self-pay

## 2021-06-09 VITALS — Ht 66.0 in | Wt 276.0 lb

## 2021-06-09 DIAGNOSIS — Z1211 Encounter for screening for malignant neoplasm of colon: Secondary | ICD-10-CM

## 2021-06-09 LAB — CERVICOVAGINAL ANCILLARY ONLY
Bacterial Vaginitis (gardnerella): POSITIVE — AB
Candida Glabrata: NEGATIVE
Candida Vaginitis: NEGATIVE
Chlamydia: NEGATIVE
Comment: NEGATIVE
Comment: NEGATIVE
Comment: NEGATIVE
Comment: NEGATIVE
Comment: NEGATIVE
Comment: NORMAL
Neisseria Gonorrhea: NEGATIVE
Trichomonas: NEGATIVE

## 2021-06-09 MED ORDER — NA SULFATE-K SULFATE-MG SULF 17.5-3.13-1.6 GM/177ML PO SOLN
1.0000 | Freq: Once | ORAL | 0 refills | Status: AC
  Filled 2021-06-09: qty 354, 1d supply, fill #0

## 2021-06-09 MED ORDER — METRONIDAZOLE 500 MG PO TABS
500.0000 mg | ORAL_TABLET | Freq: Two times a day (BID) | ORAL | 0 refills | Status: DC
  Filled 2021-06-09: qty 14, 7d supply, fill #0

## 2021-06-09 NOTE — Progress Notes (Signed)
PV completed over the phone. Pt verified name, DOB, address and insurance during PV today.  Pt mailed instruction packet with copy of consent form to read and not return, and instructions.   Pt encouraged to call with questions or issues.  If pt has My chart, procedure instructions sent via My Chart   No egg or soy allergy known to patient  No issues known to pt with past sedation with any surgeries or procedures Patient denies ever being told they had issues or difficulty with intubation  No FH of Malignant Hyperthermia Pt is not on diet pills Pt is not on  home 02  Pt is not on blood thinners  Pt states issues with constipation all the time- OTC stools oftener- it helps - she will have softer firm stools with Stool softener- uses Miralax as well- she has to take something to have a BM if not will go 2-4 days without a bm - 2 day Suprep No A fib or A flutter  Pt is fully vaccinated  for Covid     NO PA's for preps discussed with pt In PV today  Discussed with pt there will be an out-of-pocket cost for prep and that varies from $0 to 70 +  dollars - pt verbalized understanding   Due to the COVID-19 pandemic we are asking patients to follow certain guidelines in PV and the [facility]   Pt aware of COVID protocols and [facility] guidelines

## 2021-06-09 NOTE — Addendum Note (Signed)
Addended by: Darlina Rumpf on: 06/09/2021 04:54 PM   Modules accepted: Orders

## 2021-06-13 ENCOUNTER — Other Ambulatory Visit: Payer: BC Managed Care – PPO

## 2021-06-13 LAB — CYTOLOGY - PAP
Comment: NEGATIVE
High risk HPV: NEGATIVE

## 2021-06-14 ENCOUNTER — Telehealth: Payer: Self-pay | Admitting: Advanced Practice Midwife

## 2021-06-14 NOTE — Telephone Encounter (Signed)
Attempted to reach patient to discuss pap results. Voicemail left to call back. Will send MyChart message  Mallie Snooks, [place], MSN, CNM Certified Nurse Midwife, [facility] for [employer], [place] 06/14/21 2:04 PM

## 2021-06-19 ENCOUNTER — Encounter: Payer: Self-pay | Admitting: Gastroenterology

## 2021-06-22 ENCOUNTER — Encounter: Payer: Self-pay | Admitting: Internal Medicine

## 2021-06-23 ENCOUNTER — Other Ambulatory Visit: Payer: Self-pay

## 2021-06-23 ENCOUNTER — Encounter: Payer: Self-pay | Admitting: Gastroenterology

## 2021-06-23 ENCOUNTER — Ambulatory Visit (AMBULATORY_SURGERY_CENTER): Payer: BC Managed Care – PPO | Admitting: Gastroenterology

## 2021-06-23 VITALS — BP 109/64 | HR 85 | Temp 97.3°F | Resp 15 | Ht 65.0 in | Wt 276.0 lb

## 2021-06-23 DIAGNOSIS — K635 Polyp of colon: Secondary | ICD-10-CM

## 2021-06-23 DIAGNOSIS — Z1211 Encounter for screening for malignant neoplasm of colon: Secondary | ICD-10-CM

## 2021-06-23 DIAGNOSIS — D125 Benign neoplasm of sigmoid colon: Secondary | ICD-10-CM

## 2021-06-23 MED ORDER — SODIUM CHLORIDE 0.9 % IV SOLN: 500.0000 mL | Freq: Once | INTRAVENOUS | Status: DC

## 2021-06-23 NOTE — Progress Notes (Signed)
Called to room to assist during endoscopic procedure.  Patient ID and intended procedure confirmed with present staff. Received instructions for my participation in the procedure from the performing physician.  

## 2021-06-23 NOTE — Op Note (Signed)
[place] Patient Name: Sharon Fisher Procedure Date: 06/23/2021 7:09 AM MRN: [id number] Endoscopist: Nicki Reaper E. Candis Schatz , MD Age: 51 Referring MD:  Date of Birth: [birth year] Gender: Female Account #: [id number] Procedure:                Colonoscopy Indications:              Screening for colorectal malignant neoplasm, This                            is the patient's first colonoscopy Medicines:                Monitored Anesthesia Care Procedure:                Pre-Anesthesia Assessment:                           - Prior to the procedure, a History and Physical                            was performed, and patient medications and                            allergies were reviewed. The patient's tolerance of                            previous anesthesia was also reviewed. The risks                            and benefits of the procedure and the sedation                            options and risks were discussed with the patient.                            All questions were answered, and informed consent                            was obtained. Prior Anticoagulants: The patient has                            taken no previous anticoagulant or antiplatelet                            agents. ASA Grade Assessment: III - A patient with                            severe systemic disease. After reviewing the risks                            and benefits, the patient was deemed in                            satisfactory condition to undergo the procedure.  After obtaining informed consent, the colonoscope                            was passed under direct vision. Throughout the                            procedure, the patient's blood pressure, pulse, and                            oxygen saturations were monitored continuously. The                            Olympus CF-HQ190L ([id number]) Colonoscope was                            introduced through the  anus and advanced to the the                            terminal ileum, with identification of the                            appendiceal orifice and IC valve. The colonoscopy                            was performed without difficulty. The patient                            tolerated the procedure well. The quality of the                            bowel preparation was good. The terminal ileum,                            ileocecal valve, appendiceal orifice, and rectum                            were photographed. The bowel preparation used was                            SUPREP via split dose instruction. Scope In: 8:07:17 AM Scope Out: 8:24:52 AM Scope Withdrawal Time: 0 hours 11 minutes 38 seconds  Total Procedure Duration: 0 hours 17 minutes 35 seconds  Findings:                 Skin tags were found on perianal exam.                           The digital rectal exam was normal. Pertinent                            negatives include normal sphincter tone and no                            palpable rectal lesions.  Many small and large-mouthed diverticula were found                            in the sigmoid colon, descending colon and                            transverse colon. There was no evidence of                            diverticular bleeding.                           A 3 mm polyp was found in the sigmoid colon. The                            polyp was sessile. The polyp was removed with a                            cold snare. Resection and retrieval were complete.                            Estimated blood loss was minimal.                           The exam was otherwise normal throughout the                            examined colon.                           The terminal ileum appeared normal.                           The retroflexed view of the distal rectum and anal                            verge was normal and showed no anal or rectal                             abnormalities. Complications:            No immediate complications. Estimated Blood Loss:     Estimated blood loss was minimal. Impression:               - Perianal skin tags found on perianal exam.                           - Moderate diverticulosis in the sigmoid colon, in                            the descending colon and in the transverse colon.                            There was no evidence of diverticular bleeding.                           -  One 3 mm polyp in the sigmoid colon, removed with                            a cold snare. Resected and retrieved.                           - The examined portion of the ileum was normal.                           - The distal rectum and anal verge are normal on                            retroflexion view. Recommendation:           - Patient has a contact number available for                            emergencies. The signs and symptoms of potential                            delayed complications were discussed with the                            patient. Return to normal activities tomorrow.                            Written discharge instructions were provided to the                            patient.                           - Resume previous diet.                           - Continue present medications.                           - Await pathology results.                           - Repeat colonoscopy in 10 years for surveillance. Brisa Auth E. Candis Schatz, MD 06/23/2021 8:30:42 AM This report has been signed electronically.

## 2021-06-23 NOTE — Progress Notes (Signed)
VSS, transported to PACU °

## 2021-06-23 NOTE — Patient Instructions (Addendum)
Handouts provided on polyps and diverticulosis and high-fiber diet.   YOU HAD AN ENDOSCOPIC PROCEDURE TODAY AT [facility]:   Refer to the procedure report that was given to you for any specific questions about what was found during the examination.  If the procedure report does not answer your questions, please call your gastroenterologist to clarify.  If you requested that your care partner not be given the details of your procedure findings, then the procedure report has been included in a sealed envelope for you to review at your convenience later.  YOU SHOULD EXPECT: Some feelings of bloating in the abdomen. Passage of more gas than usual.  Walking can help get rid of the air that was put into your GI tract during the procedure and reduce the bloating. If you had a lower endoscopy (such as a colonoscopy or flexible sigmoidoscopy) you may notice spotting of blood in your stool or on the toilet paper. If you underwent a bowel prep for your procedure, you may not have a normal bowel movement for a few days.  Please Note:  You might notice some irritation and congestion in your nose or some drainage.  This is from the oxygen used during your procedure.  There is no need for concern and it should clear up in a day or so.  SYMPTOMS TO REPORT IMMEDIATELY:  Following lower endoscopy (colonoscopy or flexible sigmoidoscopy):  Excessive amounts of blood in the stool  Significant tenderness or worsening of abdominal pains  Swelling of the abdomen that is new, acute  Fever of 100F or higher  For urgent or emergent issues, a gastroenterologist can be reached at any hour by calling [phone]. Do not use MyChart messaging for urgent concerns.    DIET:  We do recommend a small meal at first, but then you may proceed to your regular diet.  Drink plenty of fluids but you should avoid alcoholic beverages for 24 hours.  ACTIVITY:  You should plan to take it easy for the rest of today and  you should NOT DRIVE or use heavy machinery until tomorrow (because of the sedation medicines used during the test).    FOLLOW UP: Our staff will call the number listed on your records 48-72 hours following your procedure to check on you and address any questions or concerns that you may have regarding the information given to you following your procedure. If we do not reach you, we will leave a message.  We will attempt to reach you two times.  During this call, we will ask if you have developed any symptoms of COVID 19. If you develop any symptoms (ie: fever, flu-like symptoms, shortness of breath, cough etc.) before then, please call [phone].  If you test positive for Covid 19 in the 2 weeks post procedure, please call and report this information to [place].    If any biopsies were taken you will be contacted by phone or by letter within the next 1-3 weeks.  Please call us at [phone] if you have not heard about the biopsies in 3 weeks.    SIGNATURES/CONFIDENTIALITY: You and/or your care partner have signed paperwork which will be entered into your electronic medical record.  These signatures attest to the fact that that the information above on your After Visit Summary has been reviewed and is understood.  Full responsibility of the confidentiality of this discharge information lies with you and/or your care-partner.

## 2021-06-23 NOTE — Progress Notes (Signed)
[facility] History and Physical   Primary Care Physician:  Christiana Fuchs, DO   Reason for Procedure:   Colon cancer screening  Plan:    Screening colonoscopy     HPI: Sharon Fisher is a 51 y.o. female undergoing initial average risk screening colonoscopy.  She has no family history of colon cancer and has chronic constipation but no other chronic GI symptoms.    Past Medical History:  Diagnosis Date   Allergic rhinitis    Arthritis    hips   Diabetes mellitus    Dizziness 06/25/2017   Fatigue 04/15/2015   FRACTURE, CLAVICLE, RIGHT 11/22/2008   Qualifier: Diagnosis of  By: Alfonse Alpers MD, Audelia Hives     GERD (gastroesophageal reflux disease)    Hip pain, bilateral    ? lipomas   HIP PAIN, BILATERAL 04/05/2006   Annotation: since 1999 Qualifier: Diagnosis of  By: Conception Chancy MD, Hinton Dyer     Hypertension    Irregular menstruation    Left-sided back pain 04/15/2015   Lipoma of other skin and subcutaneous tissue 05/05/2008   Qualifier: Diagnosis of  By: Alfonse Alpers MD, Ruben     Lymphedema    chronic, since age 86   Mammogram abnormal    06, s/p biopsy of mass and neg work up for neoplasm   Neck pain on right side 06/25/2017   Need for prophylactic vaccination with combined diphtheria-tetanus-pertussis (DTP) vaccine 07/15/2012   Obesity    Polyarthropathy    inflammatory   Right ovarian cyst    s/p resection of r ovary 06   RLQ PAIN 03/15/2009   Qualifier: Diagnosis of  By: Eyvonne Mechanic MD, Vijay     Sebaceous cyst 07/15/2012   Shoulder pain 07/26/2015   Vaginal itching 12/27/2016   Wears glasses     Past Surgical History:  Procedure Laterality Date   BREAST BIOPSY Left    BREAST REDUCTION SURGERY  1996   DILATION AND CURETTAGE OF UTERUS     MASS EXCISION N/A 02/27/2013   Procedure: CYST REMOVAL NECK;  Surgeon: Harl Bowie, MD;  Location: [facility];  Service: General;  Laterality: N/A;  Excision chronic posterior neck cyst   MASS EXCISION Left  10/05/2019   Procedure: EXCISION POSTERIOR NECK SEBACEOUS CYST AND LEFT HIP MASS;  Surgeon: Coralie Keens, MD;  Location: [facility];  Service: General;  Laterality: Left;  Hip & neck sites   [place] Bilateral 1996   surgery of fibroids     but still has fibroids   TONSILLECTOMY AND ADENOIDECTOMY      Prior to Admission medications   Medication Sig Start Date End Date Taking? Authorizing Provider  glipiZIDE (GLUCOTROL) 5 MG tablet Take 1 tablet (5 mg total) by mouth daily before breakfast. 12/21/20  Yes Masters, Katie, DO  glucose blood (CONTOUR NEXT TEST) test strip Use up to two times per day. 08/20/18  Yes Seawell, Jaimie A, DO  Lancets MISC Use up to two times per day 08/20/18  Yes Seawell, Jaimie A, DO  lisinopril-hydrochlorothiazide (ZESTORETIC) 20-12.5 MG tablet Take 1 tablet by mouth daily. 12/22/20  Yes Masters, Katie, DO  metFORMIN (GLUCOPHAGE) 1000 MG tablet Take 1 tablet (1,000 mg total) by mouth 2 (two) times daily with a meal. 03/08/21  Yes Amponsah, Charisse March, MD  metroNIDAZOLE (FLAGYL) 500 MG tablet Take 1 tablet (500 mg total) by mouth 2 (two) times daily. 06/09/21  Yes Darlina Rumpf, CNM  Semaglutide 14 MG TABS Take 1 tablet by mouth daily. 03/27/21  Yes Wayland Denis, MD  acetaminophen (TYLENOL) 500 MG tablet Take 2 tablets (1,000 mg total) by mouth every 8 (eight) hours as needed (pain). 11/19/16   Shela Leff, MD  ferrous gluconate (FERGON) 324 MG tablet Take 1 tablet (324 mg total) by mouth daily with breakfast. 07/17/19   Jean Rosenthal, MD  gabapentin (NEURONTIN) 300 MG capsule Take 1 capsule (300 mg total) by mouth at bedtime as needed (neuropathy in feet). 08/05/18   Oval Linsey, MD  ibuprofen (ADVIL) 800 MG tablet Take 1 tablet (800 mg total) by mouth every 8 (eight) hours as needed. 12/23/20   Masters, Katie, DO  rosuvastatin (CRESTOR) 20 MG tablet Take 1 tablet (20 mg total) by mouth daily.  12/22/20   Masters, Katie, DO  enalapril (VASOTEC) 20 MG tablet TAKE 1 TABLET BY MOUTH DAILY. 05/02/20 05/23/20  Rehman, Areeg Delane Ginger, DO    Current Outpatient Medications  Medication Sig Dispense Refill   glipiZIDE (GLUCOTROL) 5 MG tablet Take 1 tablet (5 mg total) by mouth daily before breakfast. 180 tablet 1   glucose blood (CONTOUR NEXT TEST) test strip Use up to two times per day. 100 each 4   Lancets MISC Use up to two times per day 100 each 4   lisinopril-hydrochlorothiazide (ZESTORETIC) 20-12.5 MG tablet Take 1 tablet by mouth daily. 30 tablet 11   metFORMIN (GLUCOPHAGE) 1000 MG tablet Take 1 tablet (1,000 mg total) by mouth 2 (two) times daily with a meal. 180 tablet 1   metroNIDAZOLE (FLAGYL) 500 MG tablet Take 1 tablet (500 mg total) by mouth 2 (two) times daily. 14 tablet 0   Semaglutide 14 MG TABS Take 1 tablet by mouth daily. 90 tablet 3   acetaminophen (TYLENOL) 500 MG tablet Take 2 tablets (1,000 mg total) by mouth every 8 (eight) hours as needed (pain). 30 tablet 0   ferrous gluconate (FERGON) 324 MG tablet Take 1 tablet (324 mg total) by mouth daily with breakfast. 30 tablet 11   gabapentin (NEURONTIN) 300 MG capsule Take 1 capsule (300 mg total) by mouth at bedtime as needed (neuropathy in feet). 30 capsule 5   ibuprofen (ADVIL) 800 MG tablet Take 1 tablet (800 mg total) by mouth every 8 (eight) hours as needed. 30 tablet 0   rosuvastatin (CRESTOR) 20 MG tablet Take 1 tablet (20 mg total) by mouth daily. 30 tablet 11   Current Facility-Administered Medications  Medication Dose Route Frequency Provider Last Rate Last Admin   0.9 %  sodium chloride infusion  500 mL Intravenous Once Daryel November, MD        Allergies as of 06/23/2021 - Review Complete 06/23/2021  Allergen Reaction Noted   Shellfish allergy Anaphylaxis 10/24/2010    Family History  Problem Relation Age of Onset   Hypertension Mother    Cancer Mother        breast cancer   Anemia Mother    Sickle cell  trait Sister    Cancer Maternal Uncle        breast cancer   Cancer Paternal Grandmother        lung cancer   Hypertension Paternal Grandmother    Diabetes Paternal Grandmother    Colon cancer Neg Hx    Colon polyps Neg Hx    Esophageal cancer Neg Hx    Rectal cancer Neg Hx    Stomach cancer Neg Hx     Social History  Socioeconomic History   Marital status: Married    Spouse name: Not on file   Number of children: Not on file   Years of education: 10   Highest education level: Not on file  Occupational History   Not on file  Tobacco Use   Smoking status: Former   Smokeless tobacco: Former   Tobacco comments:    Quit 1995.  Vaping Use   Vaping Use: Never used  Substance and Sexual Activity   Alcohol use: No    Alcohol/week: 0.0 standard drinks   Drug use: No   Sexual activity: Not on file  Other Topics Concern   Not on file  Social History Narrative   Works as a Building control surveyor.   Lives at home with husband and daughter.   Social Determinants of Health   Financial Resource Strain: Not on file  Food Insecurity: No Food Insecurity   Worried About Charity fundraiser in the Last Year: Never true   Ran Out of Food in the Last Year: Never true  Transportation Needs: No Transportation Needs   Lack of Transportation (Medical): No   Lack of Transportation (Non-Medical): No  Physical Activity: Not on file  Stress: Not on file  Social Connections: Not on file  Intimate Partner Violence: Not on file    Review of Systems:  All other review of systems negative except as mentioned in the HPI.  Physical Exam: Vital signs BP 130/64    Pulse 90    Temp (!) 97.3 F (36.3 C)    Ht 5\' 5"  (1.651 m)    Wt 276 lb (125.2 kg)    SpO2 100%    BMI 45.93 kg/m   General:   Alert,  Well-developed, well-nourished, pleasant and cooperative in NAD Airway:  Mallampati 2 Lungs:  Clear throughout to auscultation.   Heart:  Regular rate and rhythm; no murmurs, clicks, rubs,  or  gallops. Abdomen:  Soft, nontender and nondistended. Normal bowel sounds.   Neuro/Psych:  Normal mood and affect. A and O x 3   Faraz Ponciano E. Candis Schatz, MD [facility]

## 2021-06-23 NOTE — Progress Notes (Signed)
Pt's states no medical or surgical changes since previsit or office visit.  VS CW  

## 2021-06-27 ENCOUNTER — Telehealth: Payer: Self-pay

## 2021-06-27 NOTE — Telephone Encounter (Signed)
°  Follow up Call-  Call back number 06/23/2021  Post procedure Call Back phone  # [phone]  Permission to leave phone message Yes  Some recent data might be hidden     Patient questions:  Do you have a fever, pain , or abdominal swelling? No. Pain Score  0 *  Have you tolerated food without any problems? Yes.    Have you been able to return to your normal activities? Yes.    Do you have any questions about your discharge instructions: Diet   No. Medications  No. Follow up visit  No.  Do you have questions or concerns about your Care? No.  Actions: * If pain score is 4 or above: No action needed, pain <4.  Have you developed a fever since your procedure? No  2.   Have you had an respiratory symptoms (SOB or cough) since your procedure? No  3.   Have you tested positive for COVID 19 since your procedure No  4.   Have you had any family members/close contacts diagnosed with the COVID 19 since your procedure?  No   If yes to any of these questions please route to Joylene John, RN and Joella Prince, RN

## 2021-07-05 NOTE — Progress Notes (Signed)
Sharon Fisher,  Good news: the polyp (or polyps) that I removed during your recent examination were NOT precancerous.  You should continue to follow current colorectal cancer screening guidelines with a repeat colonoscopy in 10 years.    If you develop any new rectal bleeding, abdominal pain or significant bowel habit changes, please contact me before then.

## 2021-07-07 ENCOUNTER — Other Ambulatory Visit (HOSPITAL_COMMUNITY): Payer: Self-pay

## 2021-07-11 ENCOUNTER — Ambulatory Visit: Payer: BC Managed Care – PPO | Admitting: Behavioral Health

## 2021-07-11 DIAGNOSIS — F330 Major depressive disorder, recurrent, mild: Secondary | ICD-10-CM

## 2021-07-11 DIAGNOSIS — R5382 Chronic fatigue, unspecified: Secondary | ICD-10-CM

## 2021-07-11 DIAGNOSIS — F419 Anxiety disorder, unspecified: Secondary | ICD-10-CM

## 2021-07-11 NOTE — BH Specialist Note (Signed)
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 Discussed confidentiality: Yes   I discussed the limitations of telemedicine and the availability of in person appointments.  Discussed there is a possibility of technology failure and discussed alternative modes of communication if that failure occurs.  I discussed that engaging in this telemedicine visit, they consent to the provision of behavioral healthcare and the services will be billed under their insurance.  Patient and/or legal guardian expressed understanding and consented to Telemedicine visit: Yes   Presenting Concerns: Patient and/or family reports the following symptoms/concerns: elevated anx/dep due to expectations of work stressors & Family issues w/Husb who is in [employer] Duration of problem: years; Severity of problem: mild trending moderate  Patient and/or Family's Strengths/Protective Factors: Social connections, Social and Emotional competence, Concrete supports in place (healthy food, safe environments, etc.), and Sense of purpose  Goals Addressed: Patient will:  Reduce symptoms of: anxiety, depression, stress, and Sleep Hygiene  issues  & relational issues in LT marriage  Increase knowledge and/or ability of: coping skills, healthy habits, and stress reduction   Demonstrate ability to: Increase healthy adjustment to current life circumstances  Progress towards Goals: Estb'd today; Pt hopes to reduce her stressors & work on her life changes to find her voice w/her Husb & her Fredrich Birks who owns her own business & has high expectations of Employees  Interventions: Interventions utilized:  Supportive Counseling and Sleep Hygiene Standardized Assessments completed:  screeners prn  Patient and/or Family Response: Pt receptive to call today & requests future appt  Assessment: Patient currently experiencing elevated anx/dep due to the stressors of the past few yrs after supporting Husb through addiction issues.   Pt has a 53yo Dtr who lives close & they speak daily.  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 Pt has a great sense of humor & has noticed feelings of resentment, dep/anx, & drudgery lately w/in her Marriage & @ work.   Patient may benefit from cont'd sessions so Pt can feel heard & find how to best express her opinions, needs & feelings through her voice as a 51yo woman.  Plan: Follow up with behavioral health clinician on : 1 month on tele for 60 min Behavioral recommendations: Keep boundaries for self & with others that are healthier for you. Use resources online to discover what might work for your needs & we will discuss next session.  Referral(s): [facility] (In Clinic)  I discussed the assessment and treatment plan with the patient and/or parent/guardian. They were provided an opportunity to ask questions and all were answered. They agreed with the plan and demonstrated an understanding of the instructions.   They were advised  to call back or seek an in-person evaluation if the symptoms worsen or if the condition fails to improve  as anticipated.  Donnetta Hutching, LMFT

## 2021-07-18 ENCOUNTER — Other Ambulatory Visit: Payer: Self-pay

## 2021-07-18 ENCOUNTER — Ambulatory Visit
Admission: RE | Admit: 2021-07-18 | Discharge: 2021-07-18 | Disposition: A | Discharge status: Home / Self Care | Payer: BC Managed Care – PPO | Source: Ambulatory Visit | Attending: Advanced Practice Midwife | Admitting: Advanced Practice Midwife

## 2021-07-18 DIAGNOSIS — R922 Inconclusive mammogram: Secondary | ICD-10-CM | POA: Diagnosis not present

## 2021-07-18 DIAGNOSIS — N644 Mastodynia: Secondary | ICD-10-CM

## 2021-07-24 ENCOUNTER — Encounter: Payer: BC Managed Care – PPO | Admitting: Internal Medicine

## 2021-07-26 ENCOUNTER — Encounter: Payer: BC Managed Care – PPO | Admitting: Internal Medicine

## 2021-07-27 ENCOUNTER — Encounter: Payer: Self-pay | Admitting: Internal Medicine

## 2021-08-08 ENCOUNTER — Ambulatory Visit: Payer: BC Managed Care – PPO | Admitting: Behavioral Health

## 2021-08-08 DIAGNOSIS — F331 Major depressive disorder, recurrent, moderate: Secondary | ICD-10-CM

## 2021-08-08 DIAGNOSIS — F419 Anxiety disorder, unspecified: Secondary | ICD-10-CM

## 2021-08-08 DIAGNOSIS — Z658 Other specified problems related to psychosocial circumstances: Secondary | ICD-10-CM
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
## 2021-08-17 ENCOUNTER — Other Ambulatory Visit (HOSPITAL_COMMUNITY): Payer: Self-pay

## 2021-08-17 ENCOUNTER — Ambulatory Visit: Payer: BC Managed Care – PPO | Admitting: Student

## 2021-08-17 ENCOUNTER — Encounter: Payer: Self-pay | Admitting: Student

## 2021-08-17 ENCOUNTER — Other Ambulatory Visit: Payer: Self-pay | Admitting: Student

## 2021-08-17 VITALS — BP 142/78 | HR 103 | Temp 98.3°F | Wt 281.9 lb

## 2021-08-17 DIAGNOSIS — L0211 Cutaneous abscess of neck: Secondary | ICD-10-CM | POA: Diagnosis not present

## 2021-08-17 MED ORDER — DOXYCYCLINE HYCLATE 100 MG PO CAPS
100.0000 mg | ORAL_CAPSULE | Freq: Two times a day (BID) | ORAL | 0 refills | Status: AC
  Filled 2021-08-17: qty 14, 7d supply, fill #0

## 2021-08-17 MED ORDER — NAPROXEN 500 MG PO TABS: 500.0000 mg | ORAL_TABLET | Freq: Two times a day (BID) | ORAL | 0 refills | Status: DC

## 2021-08-17 NOTE — Progress Notes (Signed)
? ?CC: neck pain ? ?HPI: ? ?[name] is a 51 y.o. person with medical history as below presenting to [facility] for neck pain. ? ?Please see problem-based list for further details, assessments, and plans. ? ?Past Medical History:  ?Diagnosis Date  ? Allergic rhinitis   ? Arthritis   ? hips  ? Diabetes mellitus   ? Dizziness 06/25/2017  ? Fatigue 04/15/2015  ? FRACTURE, CLAVICLE, RIGHT 11/22/2008  ? Qualifier: Diagnosis of  By: Alfonse Alpers MD, Audelia Hives    ? GERD (gastroesophageal reflux disease)   ? Hip pain, bilateral   ? ? lipomas  ? HIP PAIN, BILATERAL 04/05/2006  ? Annotation: since 1999 Qualifier: Diagnosis of  By: Conception Chancy MD, Hinton Dyer    ? Hypertension   ? Irregular menstruation   ? Left-sided back pain 04/15/2015  ? Lipoma of other skin and subcutaneous tissue 05/05/2008  ? Qualifier: Diagnosis of  By: Alfonse Alpers MD, Audelia Hives    ? Lymphedema   ? chronic, since age 14  ? Mammogram abnormal   ? 06, s/p biopsy of mass and neg work up for neoplasm  ? Neck pain on right side 06/25/2017  ? Need for prophylactic vaccination with combined diphtheria-tetanus-pertussis (DTP) vaccine 07/15/2012  ? Obesity   ? 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 Right ovarian cyst   ? s/p resection of r ovary 06  ? RLQ PAIN 03/15/2009  ? Qualifier: Diagnosis of  By: Eyvonne Mechanic MD, Vijay    ? Sebaceous cyst 07/15/2012  ? Shoulder pain 07/26/2015  ? Vaginal itching 12/27/2016  ? Wears glasses   ? ?Review of Systems:  As per HPI ? ?Physical Exam: ? ?Vitals:  ? 08/17/21 1524  ?BP: (!) 142/78  ?Pulse: (!) 103  ?Temp: 98.3 ?F (36.8 ?C)  ?TempSrc: Oral  ?SpO2: 100%  ?Weight: 281 lb 14.4 oz (127.9 kg)  ? ?General: Resting comfortably in no acute distress ?HENT: Posterior neck with large area of fluctuance ranging from hairline down to upper back, warm and tender to palpation throughout. Crease on posterior neck without visible drainage. No open wounds appreciated. ?CV: Tachycardic, rhythm. No murmurs appreciated. ?Pulm: Normal respiratory rate on room air.  Clear to ausculation bilaterally. ?Neuro: Awake, alert, conversing appropriately. ?Psych: Normal mood, affect, speech ? ?Assessment & Plan:  ? ?Abscess of neck ?Patient is presenting today with a week history of neck pain due to large mass. She states the area feels very warm and has been tender to touch. She has had some mild drainage intermittently from the crease in her neck. Notes that she has had these prior multiple times, each requiring I&D with general surgery. She denies fevers, chills, body aches, lightheadedness, dizziness, focal weaknesses or paresthesias. She is here today requesting anti-inflammatory medication for the pain. ? ?On exam, large area of fluctuance on entire posterior neck from hairline to upper back. Bedside ultrasound completed in office today, revealed multiple pockets of fluid with tracks across posterior neck. Given complexity of abscesses, we will defer to general surgery for I&D. We will prescribe antibiotic and have her follow-up with general surgery. After treatment, would consider referral to dermatology for possible hidradenitis suppurativa. She has had at least three of these episodes on the posterior neck with multiple abscesses. Although she does not have characteristic lesions, she has multiple risk factors including sex, race, obesity, diabetes, and history of tobacco use.  ? ?- Doxycycline '100mg'$  twice daily x7d ?- Referral general surgery ?- Return precautions given ?- Consider referral to dermatology for  hidradenitis suppurativa work-up ? ? ?Patient discussed with Dr.  Saverio Danker ? ?Sanjuan Dame, MD ?Internal Medicine PGY-2 ?Pager: [phone] ? ?

## 2021-08-17 NOTE — Patient Instructions (Signed)
[name], it was a pleasure seeing you today! ? ?Today we discussed: ?- I want you to start taking doxycycline twice daily. This is an antibiotic. I have also referred you to surgery for them to drain this.  ? ?-Please make sure to return to clinic if the pain worsens or if the infection seems to be spreading. ? ?Referrals ordered today:  ? ?Referral Orders    ?     Ambulatory referral to General Surgery    ?  ? ?I have ordered the following medication/changed the following medications:  ? ?Start the following medications: ?Meds ordered this encounter  ?Medications  ? doxycycline (VIBRAMYCIN) 100 MG capsule  ?  Sig: Take 1 capsule (100 mg total) by mouth 2 (two) times daily for 7 days.  ?  Dispense:  14 capsule  ?  Refill:  0  ?  ? ?Follow-up:  as needed   ? ?Please make sure to arrive 15 minutes prior to your next appointment. If you arrive late, you may be asked to reschedule.  ? ?We look forward to seeing you next time. Please call our clinic at [phone] if you have any questions or concerns. The best time to call is Monday-Friday from 9am-4pm, but there is someone available 24/7. If after hours or the weekend, call the main hospital number and ask for the Internal Medicine Resident On-Call. If you need medication refills, please notify your pharmacy one week in advance and they will send [place] a request. ? ?Thank you for letting us take part in your care. Wishing you the best! ? ?Thank you, ?Sanjuan Dame, MD ? ?

## 2021-08-18 ENCOUNTER — Other Ambulatory Visit (HOSPITAL_COMMUNITY): Payer: Self-pay

## 2021-08-19 NOTE — Assessment & Plan Note (Addendum)
Patient is presenting today with a week history of neck pain due to large mass. She states the area feels very warm and has been tender to touch. She has had some mild drainage intermittently from the crease in her neck. Notes that she has had these prior multiple times, each requiring I&D with general surgery. She denies fevers, chills, body aches, lightheadedness, dizziness, focal weaknesses or paresthesias. She is here today requesting anti-inflammatory medication for the pain. ? ?On exam, large area of fluctuance on entire posterior neck from hairline to upper back. Bedside ultrasound completed in office today, revealed multiple pockets of fluid with tracks across posterior neck. Given complexity of abscesses, we will defer to general surgery for I&D. We will prescribe antibiotic and have her follow-up with general surgery. After treatment, would consider referral to dermatology for possible hidradenitis suppurativa. She has had at least three of these episodes on the posterior neck with multiple abscesses. Although she does not have characteristic lesions, she has multiple risk factors including sex, race, obesity, diabetes, and history of tobacco use.  ? ?- Doxycycline '100mg'$  twice daily x7d ?- Referral general surgery ?- Return precautions given ?- Consider referral to dermatology for hidradenitis suppurativa work-up ?

## 2021-08-21 NOTE — Progress Notes (Addendum)
Internal Medicine Clinic Attending ? ?Case discussed with Dr. Collene Gobble  At the time of the visit.  We reviewed the resident?s history and exam and pertinent patient test results.  I agree with the assessment, diagnosis, and plan of care documented in the resident?s note. Complicated, recurrent SSTI of posterior neck--ultrasound with appearance of loculations and tracking. No systemic symptoms, discussed antibiotic therapy with urgent referral to general surgery for debridement.  ?

## 2021-08-28 ENCOUNTER — Other Ambulatory Visit (HOSPITAL_COMMUNITY): Payer: Self-pay

## 2021-08-31 ENCOUNTER — Other Ambulatory Visit (HOSPITAL_COMMUNITY): Payer: Self-pay

## 2021-08-31 MED ORDER — NAPROXEN 500 MG PO TABS
500.0000 mg | ORAL_TABLET | Freq: Two times a day (BID) | ORAL | 0 refills | Status: DC
  Filled 2021-08-31: qty 10, 5d supply, fill #0

## 2021-08-31 NOTE — Addendum Note (Signed)
Addended bySanjuan Dame on: 08/31/2021 05:11 PM ? ? Modules accepted: Orders ? ?

## 2021-09-04 ENCOUNTER — Other Ambulatory Visit (HOSPITAL_COMMUNITY): Payer: Self-pay

## 2021-09-04 ENCOUNTER — Other Ambulatory Visit: Payer: Self-pay | Admitting: Surgery

## 2021-09-04 DIAGNOSIS — L0211 Cutaneous abscess of neck: Secondary | ICD-10-CM | POA: Diagnosis not present

## 2021-09-04 MED ORDER — DOXYCYCLINE MONOHYDRATE 100 MG PO CAPS
100.0000 mg | ORAL_CAPSULE | Freq: Two times a day (BID) | ORAL | 0 refills | Status: DC
Start: 2021-09-04 — End: 2022-02-14
  Filled 2021-09-04: qty 20, 10d supply, fill #0

## 2021-09-12 ENCOUNTER — Ambulatory Visit: Payer: BC Managed Care – PPO | Admitting: Behavioral Health

## 2021-09-19 NOTE — Progress Notes (Signed)
Surgical Instructions ? ? ? Your procedure is scheduled on 09/28/21. ? Report to [facility] Main Entrance "A" at 7:00 A.M., then check in with the Admitting office. ? Call this number if you have problems the morning of surgery: ? [phone] ? ? If you have any questions prior to your surgery date call [phone]: Open Monday-Friday 8am-4pm ? ? ? Remember: ? Do not eat after midnight the night before your surgery ? ?You may drink clear liquids until 6:00am the morning of your surgery.   ?Clear liquids allowed are: Water, Non-Citrus Juices (without pulp), Carbonated Beverages, Clear Tea, Black Coffee ONLY (NO MILK, CREAM OR POWDERED CREAMER of any kind), and Gatorade ?  ? Take these medicines the morning of surgery with A SIP OF WATER:  ?rosuvastatin (CRESTOR)  ? ?IF NEEDED: ?acetaminophen (TYLENOL) ? ?As of today, STOP taking any Aspirin (unless otherwise instructed by your surgeon) Aleve, Naproxen, Ibuprofen, Motrin, Advil, Goody's, BC's, all herbal medications, fish oil, and all vitamins. ? ?WHAT DO I DO ABOUT MY DIABETES MEDICATION? ? ? ?Do not take oral diabetes medicines (pills) the morning of surgery. ?    ?THE MORNING OF SURGERY, do not take glipiZIDE (GLUCOTROL) or metFORMIN (GLUCOPHAGE) or Semaglutide. ? ?The day of surgery, do not take other diabetes injectables, including Byetta (exenatide), Bydureon (exenatide ER), Victoza (liraglutide), or Trulicity (dulaglutide). ? ?If your CBG is greater than 220 mg/dL, you may take ? of your sliding scale (correction) dose of insulin. ? ? ?HOW TO MANAGE YOUR DIABETES ?BEFORE AND AFTER SURGERY ? ?Why is it important to control my blood sugar before and after surgery? ?Improving blood sugar levels before and after surgery helps healing and can limit problems. ?A way of improving blood sugar control is eating a healthy diet by: ? Eating less sugar and carbohydrates ? Increasing activity/exercise ? Talking with your doctor about reaching your blood sugar goals ?High  blood sugars (greater than 180 mg/dL) can raise your risk of infections and slow your recovery, so you will need to focus on controlling your diabetes during the weeks before surgery. ?Make sure that the doctor who takes care of your diabetes knows about your planned surgery including the date and location. ? ?How do I manage my blood sugar before surgery? ?Check your blood sugar at least 4 times a day, starting 2 days before surgery, to make sure that the level is not too high or low. ? ?Check your blood sugar the morning of your surgery when you wake up and every 2 hours until you get to the Short Stay unit. ? ?If your blood sugar is less than 70 mg/dL, you will need to treat for low blood sugar: ?Do not take insulin. ?Treat a low blood sugar (less than 70 mg/dL) with ? cup of clear juice (cranberry or apple), 4 glucose tablets, OR glucose gel. ?Recheck blood sugar in 15 minutes after treatment (to make sure it is greater than 70 mg/dL). If your blood sugar is not greater than 70 mg/dL on recheck, call [phone] for further instructions. ?Report your blood sugar to the short stay nurse when you get to Short Stay. ? ?If you are admitted to the hospital after surgery: ?Your blood sugar will be checked by the staff and you will probably be given insulin after surgery (instead of oral diabetes medicines) to make sure you have good blood sugar levels. ?The goal for blood sugar control after surgery is 80-180 mg/dL. ?      ?Do not wear  jewelry or makeup ?Do not wear lotions, powders, perfumes/colognes, or deodorant. ?Do not shave 48 hours prior to surgery.   ?Do not bring valuables to the hospital. ?Do not wear nail polish, gel polish, artificial nails, or any other type of covering on natural nails (fingers and toes) ?If you have artificial nails or gel coating that need to be removed by a nail salon, please have this removed prior to surgery. Artificial nails or gel coating may interfere with anesthesia's ability  to adequately monitor your vital signs. ? ?[facility] is not responsible for any belongings or valuables. .  ? ?Do NOT Smoke (Tobacco/Vaping)  24 hours prior to your procedure ? ?If you use a CPAP at night, you may bring your mask for your overnight stay. ?  ?Contacts, glasses, hearing aids, dentures or partials may not be worn into surgery, please bring cases for these belongings ?  ?For patients admitted to the hospital, discharge time will be determined by your treatment team. ?  ?Patients discharged the day of surgery will not be allowed to drive home, and someone needs to stay with them for 24 hours. ? ? ?SURGICAL WAITING ROOM VISITATION ?Patients having surgery or a procedure in a hospital may have two support people. ?Children under the age of 53 must have an adult with them who is not the patient. ?They may stay in the waiting area during the procedure and may switch out with other visitors. If the patient needs to stay at the hospital during part of their recovery, the visitor guidelines for inpatient rooms apply. ? ?Please refer to the [facility] website for the visitor guidelines for Inpatients (after your surgery is over and you are in a regular room).  ? ? ? ? ? ?Special instructions:   ? ?Oral Hygiene is also important to reduce your risk of infection.  Remember - BRUSH YOUR TEETH THE MORNING OF SURGERY WITH YOUR REGULAR TOOTHPASTE ? ? ?[place]- Preparing For Surgery ? ?Before surgery, you can play an important role. Because skin is not sterile, your skin needs to be as free of germs as possible. You can reduce the number of germs on your skin by washing with CHG (chlorahexidine gluconate) Soap before surgery.  CHG is an antiseptic cleaner which kills germs and bonds with the skin to continue killing germs even after washing.   ? ? ?Please do not use if you have an allergy to CHG or antibacterial soaps. If your skin becomes reddened/irritated stop using the CHG.  ?Do not shave (including legs and  underarms) for at least 48 hours prior to first CHG shower. It is OK to shave your face. ? ?Please follow these instructions carefully. ?  ? ? Shower the NIGHT BEFORE SURGERY and the MORNING OF SURGERY with CHG Soap.  ? If you chose to wash your hair, wash your hair first as usual with your normal shampoo. After you shampoo, rinse your hair and body thoroughly to remove the shampoo.  Then [employer] and genitals (private parts) with your normal soap and rinse thoroughly to remove soap. ? ?After that Use CHG Soap as you would any other liquid soap. You can apply CHG directly to the skin and wash gently with a scrungie or a clean washcloth.  ? ?Apply the CHG Soap to your body ONLY FROM THE NECK DOWN.  Do not use on open wounds or open sores. Avoid contact with your eyes, ears, mouth and genitals (private parts). Wash Face and genitals (private  parts)  with your normal soap.  ? ?Wash thoroughly, paying special attention to the area where your surgery will be performed. ? ?Thoroughly rinse your body with warm water from the neck down. ? ?DO NOT shower/wash with your normal soap after using and rinsing off the CHG Soap. ? ?Pat yourself dry with a CLEAN TOWEL. ? ?Wear CLEAN PAJAMAS to bed the night before surgery ? ?Place CLEAN SHEETS on your bed the night before your surgery ? ?DO NOT SLEEP WITH PETS. ? ? ?Day of Surgery: ?Take a shower with CHG soap. ?Wear Clean/Comfortable clothing the morning of surgery ?Do not apply any deodorants/lotions.   ?Remember to brush your teeth WITH YOUR REGULAR TOOTHPASTE. ? ? ? ?If you received a COVID test during your pre-op visit  it is requested that you wear a mask when out in public, stay away from anyone that may not be feeling well and notify your surgeon if you develop symptoms. If you have been in contact with anyone that has tested positive in the last 10 days please notify you surgeon. ? ?  ?Please read over the following fact sheets that you were given.  ? ?

## 2021-09-20 ENCOUNTER — Encounter (HOSPITAL_COMMUNITY): Payer: Self-pay

## 2021-09-20 ENCOUNTER — Encounter (HOSPITAL_COMMUNITY)
Admission: RE | Admit: 2021-09-20 | Discharge: 2021-09-20 | Disposition: A | Discharge status: Home / Self Care | Payer: BC Managed Care – PPO | Source: Ambulatory Visit | Attending: Surgery | Admitting: Surgery

## 2021-09-20 ENCOUNTER — Other Ambulatory Visit: Payer: Self-pay

## 2021-09-20 VITALS — BP 129/85 | HR 95 | Temp 98.5°F | Resp 19 | Ht 65.0 in | Wt 278.5 lb

## 2021-09-20 DIAGNOSIS — Z01818 Encounter for other preprocedural examination: Secondary | ICD-10-CM | POA: Diagnosis not present

## 2021-09-20 DIAGNOSIS — I10 Essential (primary) hypertension: Secondary | ICD-10-CM | POA: Insufficient documentation

## 2021-09-20 LAB — BASIC METABOLIC PANEL
Anion gap: 8 (ref 5–15)
BUN: 7 mg/dL (ref 6–20)
CO2: 25 mmol/L (ref 22–32)
Calcium: 8.5 mg/dL — ABNORMAL LOW (ref 8.9–10.3)
Chloride: 106 mmol/L (ref 98–111)
Creatinine, Ser: 0.73 mg/dL (ref 0.44–1.00)
GFR, Estimated: >60 mL/min (ref >60)
Glucose, Bld: 199 mg/dL — ABNORMAL HIGH (ref 70–99)
Potassium: 3.7 mmol/L (ref 3.5–5.1)
Sodium: 139 mmol/L (ref 135–145)

## 2021-09-20 LAB — CBC
HCT: 35.2 % — ABNORMAL LOW (ref 36.0–46.0)
Hemoglobin: 11.4 g/dL — ABNORMAL LOW (ref 12.0–15.0)
MCH: 29.5 pg (ref 26.0–34.0)
MCHC: 32.4 g/dL (ref 30.0–36.0)
MCV: 91 fL (ref 80.0–100.0)
Platelets: 250 10*3/uL (ref 150–400)
RBC: 3.87 MIL/uL (ref 3.87–5.11)
RDW: 15.6 % — ABNORMAL HIGH (ref 11.5–15.5)
WBC: 7 10*3/uL (ref 4.0–10.5)
nRBC: 0.3 % — ABNORMAL HIGH (ref 0.0–0.2)

## 2021-09-20 LAB — GLUCOSE, CAPILLARY: Glucose-Capillary: 202 mg/dL — ABNORMAL HIGH (ref 70–99)

## 2021-09-20 LAB — HEMOGLOBIN A1C
Hgb A1c MFr Bld: 7.8 % — ABNORMAL HIGH (ref 4.8–5.6)
Mean Plasma Glucose: 177.16 mg/dL

## 2021-09-20 NOTE — Progress Notes (Addendum)
PCP - Joellen Jersey Masters DO ?Cardiologist - Denies ? ?Chest x-ray - Not indicated ?EKG - 09/20/21 ?Stress Test - Denies ?ECHO - Denies ?Cardiac Cath - Denies ? ?Sleep Study - Many years ago no osa diagnosis ? ?CBG 202 at PAT appt ?DM - Type II ?Fasting Blood Sugar -  usually below 150  ?Checks Blood Sugar _5____ times a month ? ?ERAS Protcol - Yes  ? ? ? ?Anesthesia review: No ? ?Patient denies shortness of breath, fever, cough and chest pain at PAT appointment ? ? ?All instructions explained to the patient, with a verbal understanding of the material. Patient agrees to go over the instructions while at home for a better understanding.  The opportunity to ask questions was provided. ? ? ?

## 2021-09-27 NOTE — H&P (Signed)
?REFERRING PHYSICIAN: Charise Killian, MD ? ?PROVIDER: Beverlee Nims, MD ? ?MRN: [id number] ?DOB: [birth year] ?DATE OF ENCOUNTER: 09/04/2021 ?Subjective  ? ?Chief Complaint: Abscess (Neck) ? ? ?History of Present Illness: ?Sharon Fisher is a 51 y.o. female who is seen today as an office consultation for evaluation of Abscess (Neck) ?.  ? ?This is a 51 year old female with a history of chronic abscesses that I have also operated on in the past for chronic neck abscess. She has had another recurrence on the back of her neck separate from her previous surgical site. She was placed on antibiotics but the area spontaneously ruptured and drained. She now feels better. She has otherwise had no change in her medical care since I saw her 2 years ago. She has no cardiopulmonary issues. ? ?Review of Systems: ?A complete review of systems was obtained from the patient. I have reviewed this information and discussed as appropriate with the patient. See HPI as well for other ROS. ? ?ROS  ? ?Medical History: ?Past Medical History:  ?Diagnosis Date  ? Diabetes mellitus without complication (CMS-HCC)  ? Hypertension  ? ?There is no problem list on file for this patient. ? ?History reviewed. No pertinent surgical history.  ? ?Allergies  ?Allergen Reactions  ? Shellfish Containing Products Anaphylaxis  ? ?Current Outpatient Medications on File Prior to Visit  ?Medication Sig Dispense Refill  ? glipiZIDE (GLUCOTROL) 10 MG tablet Take 10 mg by mouth every morning before breakfast  ? metFORMIN (GLUCOPHAGE) 1000 MG tablet Take 1,000 mg by mouth 2 (two) times daily with meals  ? RYBELSUS 14 mg tablet  ? ?No current facility-administered medications on file prior to visit.  ? ?Family History  ?Problem Relation Age of Onset  ? Breast cancer Mother  ? Obesity Mother  ? ? ?Social History  ? ?Tobacco Use  ?Smoking Status Former  ? Types: Cigarettes  ? Quit date: 1995  ? Years since quitting: 28.2  ?Smokeless Tobacco Never  ? ? ?Social  History  ? ?Socioeconomic History  ? Marital status: Married  ?Tobacco Use  ? Smoking status: Former  ?Types: Cigarettes  ?Quit date: 1995  ?Years since quitting: 28.2  ? Smokeless tobacco: Never  ?Substance and Sexual Activity  ? Alcohol use: Never  ? Drug use: Never  ? ?Objective:  ? ?Vitals:  ?09/04/21 1149  ?BP: 136/80  ?Pulse: 86  ?Temp: 37 ?C (98.6 ?F)  ?SpO2: 98%  ?Weight: (!) 127 kg (280 lb)  ?Height: 165.1 cm ('5\' 5"'$ )  ? ?Body mass index is 46.59 kg/m?. ? ?Physical Exam  ? ?On exam today, there are chronic skin changes on the posterior neck especially toward the left from the previous abscess. She does have a lot of fatty tissue on the back of her neck as well. This is starting to look more consistent with hidradenitis ? ?Assessment and Plan:  ? ?Diagnoses and all orders for this visit: ? ?Abscess of skin of neck ? ?Other orders ?- doxycycline (MONODOX) 100 MG capsule; Take 1 capsule (100 mg total) by mouth 2 (two) times daily for 10 days ? ? ? ?At this point this is a chronic neck abscess. I believe a excision of this area is necessary to try to prevent ongoing infections. She is eager to have surgery as well because of her ongoing occasional infections. I explained the surgical procedure in detail. She understands that she will have sutures in place at least 2 weeks. We discussed the risks of bleeding, infection,  recurrence, having a chronic open wound with wound packing, etc. Surgery will be scheduled  ?

## 2021-09-28 ENCOUNTER — Other Ambulatory Visit: Payer: Self-pay

## 2021-09-28 ENCOUNTER — Ambulatory Visit (HOSPITAL_COMMUNITY): Payer: BC Managed Care – PPO | Admitting: Anesthesiology

## 2021-09-28 ENCOUNTER — Encounter (HOSPITAL_COMMUNITY): Payer: Self-pay | Admitting: Surgery

## 2021-09-28 ENCOUNTER — Other Ambulatory Visit (HOSPITAL_COMMUNITY): Payer: Self-pay

## 2021-09-28 ENCOUNTER — Ambulatory Visit (HOSPITAL_COMMUNITY)
Admission: RE | Admit: 2021-09-28 | Discharge: 2021-09-28 | Disposition: A | Discharge status: Home / Self Care | Payer: BC Managed Care – PPO | Source: Ambulatory Visit | Attending: Surgery | Admitting: Surgery

## 2021-09-28 ENCOUNTER — Encounter (HOSPITAL_COMMUNITY)
Admission: RE | Disposition: A | Discharge status: Home / Self Care | Payer: Self-pay | Source: Ambulatory Visit | Attending: Surgery

## 2021-09-28 DIAGNOSIS — Z794 Long term (current) use of insulin: Secondary | ICD-10-CM | POA: Diagnosis not present

## 2021-09-28 DIAGNOSIS — L0211 Cutaneous abscess of neck: Secondary | ICD-10-CM | POA: Diagnosis not present

## 2021-09-28 DIAGNOSIS — Z7984 Long term (current) use of oral hypoglycemic drugs: Secondary | ICD-10-CM | POA: Insufficient documentation

## 2021-09-28 DIAGNOSIS — Z87891 Personal history of nicotine dependence: Secondary | ICD-10-CM | POA: Diagnosis not present

## 2021-09-28 DIAGNOSIS — Z6841 Body Mass Index (BMI) 40.0 and over, adult: Secondary | ICD-10-CM | POA: Diagnosis not present

## 2021-09-28 DIAGNOSIS — E119 Type 2 diabetes mellitus without complications: Secondary | ICD-10-CM | POA: Diagnosis not present

## 2021-09-28 DIAGNOSIS — I10 Essential (primary) hypertension: Secondary | ICD-10-CM | POA: Diagnosis not present

## 2021-09-28 DIAGNOSIS — L308 Other specified dermatitis: Secondary | ICD-10-CM | POA: Diagnosis not present

## 2021-09-28 DIAGNOSIS — Z79899 Other long term (current) drug therapy: Secondary | ICD-10-CM | POA: Insufficient documentation

## 2021-09-28 HISTORY — PX: CYST EXCISION: SHX5701

## 2021-09-28 LAB — GLUCOSE, CAPILLARY
Glucose-Capillary: 113 mg/dL — ABNORMAL HIGH (ref 70–99)
Glucose-Capillary: 112 mg/dL — ABNORMAL HIGH (ref 70–99)

## 2021-09-28 LAB — POCT PREGNANCY, URINE: Preg Test, Ur: NEGATIVE

## 2021-09-28 SURGERY — CYST REMOVAL
Anesthesia: General | Site: Neck | Laterality: Left

## 2021-09-28 MED ORDER — CHLORHEXIDINE GLUCONATE 0.12 % MT SOLN
15.0000 mL | Freq: Once | OROMUCOSAL | Status: AC
  Administered 2021-09-28: 15 mL via OROMUCOSAL
  Filled 2021-09-28: qty 15

## 2021-09-28 MED ORDER — CHLORHEXIDINE GLUCONATE CLOTH 2 % EX PADS
6.0000 | MEDICATED_PAD | Freq: Once | CUTANEOUS | Status: DC
Start: 2021-09-28 — End: 2021-09-28

## 2021-09-28 MED ORDER — ONDANSETRON HCL 4 MG/2ML IJ SOLN
INTRAMUSCULAR | Status: AC
  Filled 2021-09-28: qty 2

## 2021-09-28 MED ORDER — PROPOFOL 10 MG/ML IV BOLUS
INTRAVENOUS | Status: AC
  Filled 2021-09-28: qty 20

## 2021-09-28 MED ORDER — LIDOCAINE 2% (20 MG/ML) 5 ML SYRINGE
INTRAMUSCULAR | Status: DC | PRN
  Administered 2021-09-28: 80 mg via INTRAVENOUS

## 2021-09-28 MED ORDER — BUPIVACAINE-EPINEPHRINE 0.25% -1:200000 IJ SOLN
INTRAMUSCULAR | Status: DC | PRN
  Administered 2021-09-28: 10 mL

## 2021-09-28 MED ORDER — MIDAZOLAM HCL 2 MG/2ML IJ SOLN
INTRAMUSCULAR | Status: AC
  Filled 2021-09-28: qty 2

## 2021-09-28 MED ORDER — BUPIVACAINE-EPINEPHRINE (PF) 0.25% -1:200000 IJ SOLN
INTRAMUSCULAR | Status: AC
  Filled 2021-09-28: qty 30

## 2021-09-28 MED ORDER — SUGAMMADEX SODIUM 200 MG/2ML IV SOLN
INTRAVENOUS | Status: DC | PRN
  Administered 2021-09-28: 400 mg via INTRAVENOUS

## 2021-09-28 MED ORDER — OXYCODONE HCL 5 MG PO TABS
5.0000 mg | ORAL_TABLET | Freq: Four times a day (QID) | ORAL | 0 refills | Status: DC | PRN
  Filled 2021-09-28: qty 25, 7d supply, fill #0

## 2021-09-28 MED ORDER — LIDOCAINE 2% (20 MG/ML) 5 ML SYRINGE
INTRAMUSCULAR | Status: AC
  Filled 2021-09-28: qty 10

## 2021-09-28 MED ORDER — PROPOFOL 10 MG/ML IV BOLUS
INTRAVENOUS | Status: DC | PRN
  Administered 2021-09-28: 170 mg via INTRAVENOUS

## 2021-09-28 MED ORDER — ROCURONIUM BROMIDE 10 MG/ML (PF) SYRINGE
PREFILLED_SYRINGE | INTRAVENOUS | Status: DC | PRN
  Administered 2021-09-28: 50 mg via INTRAVENOUS

## 2021-09-28 MED ORDER — ORAL CARE MOUTH RINSE: 15.0000 mL | Freq: Once | OROMUCOSAL | Status: AC

## 2021-09-28 MED ORDER — DEXAMETHASONE SODIUM PHOSPHATE 10 MG/ML IJ SOLN
INTRAMUSCULAR | Status: AC
  Filled 2021-09-28: qty 1

## 2021-09-28 MED ORDER — ONDANSETRON HCL 4 MG/2ML IJ SOLN
INTRAMUSCULAR | Status: DC | PRN
  Administered 2021-09-28: 4 mg via INTRAVENOUS

## 2021-09-28 MED ORDER — ACETAMINOPHEN 500 MG PO TABS
1000.0000 mg | ORAL_TABLET | ORAL | Status: AC
  Administered 2021-09-28: 1000 mg via ORAL
  Filled 2021-09-28: qty 2

## 2021-09-28 MED ORDER — FENTANYL CITRATE (PF) 100 MCG/2ML IJ SOLN: 25.0000 ug | INTRAMUSCULAR | Status: DC | PRN

## 2021-09-28 MED ORDER — INSULIN ASPART 100 UNIT/ML IJ SOLN: 0.0000 [IU] | INTRAMUSCULAR | Status: DC | PRN

## 2021-09-28 MED ORDER — DEXAMETHASONE SODIUM PHOSPHATE 10 MG/ML IJ SOLN
INTRAMUSCULAR | Status: DC | PRN
  Administered 2021-09-28: 4 mg via INTRAVENOUS

## 2021-09-28 MED ORDER — ROCURONIUM BROMIDE 10 MG/ML (PF) SYRINGE
PREFILLED_SYRINGE | INTRAVENOUS | Status: AC
  Filled 2021-09-28: qty 20

## 2021-09-28 MED ORDER — FENTANYL CITRATE (PF) 250 MCG/5ML IJ SOLN
INTRAMUSCULAR | Status: AC
  Filled 2021-09-28: qty 5

## 2021-09-28 MED ORDER — LACTATED RINGERS IV SOLN: INTRAVENOUS | Status: DC

## 2021-09-28 MED ORDER — CEFAZOLIN IN SODIUM CHLORIDE 3-0.9 GM/100ML-% IV SOLN
3.0000 g | INTRAVENOUS | Status: DC
  Filled 2021-09-28: qty 100

## 2021-09-28 MED ORDER — ACETAMINOPHEN 10 MG/ML IV SOLN: 1000.0000 mg | Freq: Once | INTRAVENOUS | Status: DC | PRN

## 2021-09-28 MED ORDER — FENTANYL CITRATE (PF) 250 MCG/5ML IJ SOLN
INTRAMUSCULAR | Status: DC | PRN
  Administered 2021-09-28 (×2): 75 ug via INTRAVENOUS

## 2021-09-28 MED ORDER — MIDAZOLAM HCL 2 MG/2ML IJ SOLN
INTRAMUSCULAR | Status: DC | PRN
  Administered 2021-09-28: 2 mg via INTRAVENOUS

## 2021-09-28 SURGICAL SUPPLY — 33 items
BAG COUNTER SPONGE SURGICOUNT (BAG) ×2 IMPLANT
CANISTER SUCT 3000ML PPV (MISCELLANEOUS) IMPLANT
COVER SURGICAL LIGHT HANDLE (MISCELLANEOUS) ×2 IMPLANT
DECANTER SPIKE VIAL GLASS SM (MISCELLANEOUS) ×1 IMPLANT
DERMABOND ADVANCED (GAUZE/BANDAGES/DRESSINGS) ×1
DERMABOND ADVANCED .7 DNX12 (GAUZE/BANDAGES/DRESSINGS) ×1 IMPLANT
DRAPE LAPAROSCOPIC ABDOMINAL (DRAPES) IMPLANT
DRAPE LAPAROTOMY 100X72 PEDS (DRAPES) ×1 IMPLANT
DRSG TEGADERM 4X4.75 (GAUZE/BANDAGES/DRESSINGS) ×1 IMPLANT
ELECT REM PT RETURN 9FT ADLT (ELECTROSURGICAL) ×2
ELECTRODE REM PT RTRN 9FT ADLT (ELECTROSURGICAL) ×1 IMPLANT
GAUZE SPONGE 4X4 12PLY STRL (GAUZE/BANDAGES/DRESSINGS) ×2 IMPLANT
GLOVE SURG SIGNA 7.5 PF LTX (GLOVE) ×2 IMPLANT
GOWN STRL REUS W/ TWL LRG LVL3 (GOWN DISPOSABLE) ×1 IMPLANT
GOWN STRL REUS W/ TWL XL LVL3 (GOWN DISPOSABLE) ×1 IMPLANT
GOWN STRL REUS W/TWL LRG LVL3 (GOWN DISPOSABLE) ×2
GOWN STRL REUS W/TWL XL LVL3 (GOWN DISPOSABLE) ×2
KIT BASIN OR (CUSTOM PROCEDURE TRAY) ×2 IMPLANT
KIT TURNOVER KIT B (KITS) ×2 IMPLANT
NDL HYPO 25GX1X1/2 BEV (NEEDLE) ×1 IMPLANT
NEEDLE HYPO 25GX1X1/2 BEV (NEEDLE) ×2 IMPLANT
NS IRRIG 1000ML POUR BTL (IV SOLUTION) ×2 IMPLANT
PACK GENERAL/GYN (CUSTOM PROCEDURE TRAY) ×2 IMPLANT
PAD ARMBOARD 7.5X6 YLW CONV (MISCELLANEOUS) ×2 IMPLANT
PENCIL SMOKE EVACUATOR (MISCELLANEOUS) ×1 IMPLANT
SPECIMEN JAR SMALL (MISCELLANEOUS) ×2 IMPLANT
SUT ETHILON 2 0 FS 18 (SUTURE) ×4 IMPLANT
SUT MNCRL AB 4-0 PS2 18 (SUTURE) ×1 IMPLANT
SUT VIC AB 3-0 SH 27 (SUTURE)
SUT VIC AB 3-0 SH 27XBRD (SUTURE) ×1 IMPLANT
SYR CONTROL 10ML LL (SYRINGE) ×2 IMPLANT
TOWEL GREEN STERILE (TOWEL DISPOSABLE) ×2 IMPLANT
TOWEL GREEN STERILE FF (TOWEL DISPOSABLE) ×2 IMPLANT
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
## 2021-09-28 NOTE — Transfer of Care (Signed)
Immediate Anesthesia Transfer of Care Note ? ?Patient: Sharon Fisher ? ?Procedure(s) Performed: EXCISION CHRONIC POSTERIOR NECK ABSCESS (Left: Neck) ? ?Patient Location: PACU ? ?Anesthesia Type:General ? ?Level of Consciousness: awake, alert  and oriented ? ?Airway & Oxygen Therapy: Patient Spontanous Breathing and Patient connected to nasal cannula oxygen ? ?Post-op Assessment: Report given to RN and Post -op Vital signs reviewed and stable ? ?Post vital signs: Reviewed and stable ? ?Last Vitals:  ?Vitals Value Taken Time  ?BP 114/66 09/28/21 0933  ?Temp    ?Pulse 91 09/28/21 0934  ?Resp 20 09/28/21 0934  ?SpO2 100 % 09/28/21 0934  ?Vitals shown include unvalidated device data. ? ?Last Pain:  ?Vitals:  ? 09/28/21 0726  ?TempSrc:   ?PainSc: 0-No pain  ?   ? ?  ? ?Complications: No notable events documented. ?

## 2021-09-28 NOTE — Discharge Instructions (Signed)
You may shower starting tomorrow ? ?Ice pack, tylenol, and ibuprofen also for pain ? ?Cover with dry gauze daily and change as needed. ? ?EXPECT DRAINAGE FROM THE WOUND ?

## 2021-09-28 NOTE — Op Note (Signed)
EXCISION CHRONIC POSTERIOR NECK ABSCESS  Procedure Note ? ?Ripley Fraise ?09/28/2021 ? ? ?Pre-op Diagnosis: CHRONIC POSTERIOR NECK ABSCESS ?    ?Post-op Diagnosis: SAME ? ?Procedure(s): ?EXCISION CHRONIC POSTERIOR NECK ABSCESS ? ?Surgeon(s): ?Coralie Keens, MD ? ?Anesthesia: General ? ?Staff:  ?Circulator: Margy Clarks, RN; Rometta Emery, RN ?Scrub Person: Serita Grammes ? ?Estimated Blood Loss: Minimal ?              ?Specimens: sent to path ? ?Procedure: The patient brought to operating identifies correct patient.  She is placed upon the operating table general anesthesia was induced.  She was then turned to the right lateral decubitus position.  Her posterior neck was prepped and draped in usual sterile fashion.  I performed elliptical incision around her previous scar transversely across the neck using a skin lying.  I then took this down to the subcutaneous tissue with electrocautery.  I then widely excised the granulation tissue and underlying areas that appeared consistent with chronic inflammation.  This was sent to pathology for evaluation.  I achieved hemostasis with the cautery.  I then injected the wound circumferentially with Marcaine.  I then closed the incision with interrupted 2-0 nylon sutures.  Gauze and tape were then applied.  The patient tolerated the procedure well.  All the counts were correct at the end of the procedure.  Patient was then extubated in the operating room and taken in stable addition to the recovery room. ? ? ?        ? ?Coralie Keens  ? ?Date: 09/28/2021  Time: 9:20 AM ? ? ? ?

## 2021-09-28 NOTE — Interval H&P Note (Signed)
History and Physical Interval Note:no change in H and P ? ?09/28/2021 ?8:18 AM ? ?Sharon Fisher  has presented today for surgery, with the diagnosis of POSTERIOR NECK ABSCESS.  The various methods of treatment have been discussed with the patient and family. After consideration of risks, benefits and other options for treatment, the patient has consented to  Procedure(s): ?EXCISION CHRONIC POSTERIOR NECK ABSCESS (N/A) as a surgical intervention.  The patient's history has been reviewed, patient examined, no change in status, stable for surgery.  I have reviewed the patient's chart and labs.  Questions were answered to the patient's satisfaction.   ? ? ?Coralie Keens ? ? ?

## 2021-09-28 NOTE — Anesthesia Procedure Notes (Addendum)
Procedure Name: Intubation ?Date/Time: 09/28/2021 8:43 AM ?Performed by: Inda Coke, CRNA ?Pre-anesthesia Checklist: Patient identified, Emergency Drugs available, Suction available and Patient being monitored ?Patient Re-evaluated:Patient Re-evaluated prior to induction ?Oxygen Delivery Method: Circle System Utilized ?Preoxygenation: Pre-oxygenation with 100% oxygen ?Induction Type: IV induction ?Ventilation: Mask ventilation without difficulty and Oral airway inserted - appropriate to patient size ?Laryngoscope Size: Mac and 3 ?Grade View: Grade II ?Tube type: Oral ?Tube size: 7.0 mm ?Number of attempts: 1 ?Airway Equipment and Method: Stylet and Oral airway ?Placement Confirmation: ETT inserted through vocal cords under direct vision, positive ETCO2 and breath sounds checked- equal and bilateral ?Secured at: 22 cm ?Tube secured with: Tape ?Dental Injury: Teeth and Oropharynx as per pre-operative assessment  ? ? ? ? ?

## 2021-09-28 NOTE — Anesthesia Preprocedure Evaluation (Signed)
Anesthesia Evaluation  ?Patient identified by MRN, date of birth, ID band ?Patient awake ? ? ? ?Reviewed: ?Allergy & Precautions, NPO status , Patient's Chart, lab work & pertinent test results ? ?Airway ?Mallampati: III ? ?TM Distance: >3 FB ?Neck ROM: Full ? ? ? Dental ?no notable dental hx. ? ?  ?Pulmonary ?neg pulmonary ROS, former smoker,  ?  ?Pulmonary exam normal ? ? ? ? ? ? ? Cardiovascular ?hypertension, Pt. on medications ? ?Rhythm:Regular Rate:Normal ? ? ?  ?Neuro/Psych ?Anxiety negative neurological ROS ?   ? GI/Hepatic ?Neg liver ROS, GERD  ,  ?Endo/Other  ?diabetes, Type 2, Oral Hypoglycemic AgentsMorbid obesity ? Renal/GU ?negative Renal ROS  ?negative genitourinary ?  ?Musculoskeletal ? ?(+) Arthritis , Osteoarthritis,  Back of neck abscess  ? Abdominal ?Normal abdominal exam  (+)   ?Peds ? Hematology ? ?(+) Blood dyscrasia, anemia ,   ?Anesthesia Other Findings ? ? Reproductive/Obstetrics ? ?  ? ? ? ? ? ? ? ? ? ? ? ? ? ?  ?  ? ? ? ? ? ? ? ? ?Anesthesia Physical ?Anesthesia Plan ? ?ASA: 3 ? ?Anesthesia Plan: General  ? ?Post-op Pain Management:   ? ?Induction: Intravenous ? ?PONV Risk Score and Plan: 3 and Ondansetron, Dexamethasone, Midazolam and Treatment may vary due to age or medical condition ? ?Airway Management Planned: Mask and Oral ETT ? ?Additional Equipment: None ? ?Intra-op Plan:  ? ?Post-operative Plan: Extubation in OR ? ?Informed Consent: I have reviewed the patients History and Physical, chart, labs and discussed the procedure including the risks, benefits and alternatives for the proposed anesthesia with the patient or authorized representative who has indicated his/her understanding and acceptance.  ? ? ? ?Dental advisory given ? ?Plan Discussed with: CRNA ? ?Anesthesia Plan Comments: (Lab Results ?     Component                Value               Date                 ?     WBC                      7.0                 09/20/2021           ?     HGB                       11.4 (L)            09/20/2021           ?     HCT                      35.2 (L)            09/20/2021           ?     MCV                      91.0                09/20/2021           ?     PLT  250                 09/20/2021           ?Lab Results ?     Component                Value               Date                 ?     NA                       139                 09/20/2021           ?     K                        3.7                 09/20/2021           ?     CO2                      25                  09/20/2021           ?     GLUCOSE                  199 (H)             09/20/2021           ?     BUN                      7                   09/20/2021           ?     CREATININE               0.73                09/20/2021           ?     CALCIUM                  8.5 (L)             09/20/2021           ?     EGFR                     107                 12/21/2020           ?     GFRNONAA                 >60                 09/20/2021          )  ? ? ? ? ? ? ?Anesthesia Quick Evaluation ? ?

## 2021-09-29 ENCOUNTER — Encounter (HOSPITAL_COMMUNITY): Payer: Self-pay | Admitting: Surgery

## 2021-09-29 LAB — SURGICAL PATHOLOGY

## 2021-10-02 NOTE — Progress Notes (Signed)
No way to tell.  All old chronic inflammation and pathology didn't measure the size of it. ? ?The skin was 8 by 3 cm ?

## 2021-10-13 ENCOUNTER — Other Ambulatory Visit (HOSPITAL_COMMUNITY): Payer: Self-pay

## 2021-10-18 ENCOUNTER — Other Ambulatory Visit (HOSPITAL_COMMUNITY): Payer: Self-pay

## 2021-10-24 ENCOUNTER — Ambulatory Visit (INDEPENDENT_AMBULATORY_CARE_PROVIDER_SITE_OTHER): Payer: BC Managed Care – PPO | Admitting: *Deleted

## 2021-10-24 DIAGNOSIS — Z111 Encounter for screening for respiratory tuberculosis: Secondary | ICD-10-CM | POA: Diagnosis not present

## 2021-10-26 ENCOUNTER — Encounter: Payer: Self-pay | Admitting: *Deleted

## 2021-10-26 LAB — TB SKIN TEST
Induration: 0 mm
TB Skin Test: NEGATIVE

## 2021-12-11 ENCOUNTER — Other Ambulatory Visit (HOSPITAL_COMMUNITY): Payer: Self-pay

## 2022-01-16 NOTE — Progress Notes (Deleted)
   CC: ***  HPI:   [name] is a 51 y.o. ***  LAST SEEN 3-16   YEAST INFECTION X X X X X X x    NECK ABSCESS - Doxycycline '100mg'$  twice daily x7d - Referral general surgery - Return precautions given - Consider referral to dermatology for hidradenitis suppurativa work-up     Past Medical History:  Diagnosis Date   Allergic rhinitis    Arthritis    hips   Diabetes mellitus    Dizziness 06/25/2017   Fatigue 04/15/2015   FRACTURE, CLAVICLE, RIGHT 11/22/2008   Qualifier: Diagnosis of  By: Alfonse Alpers MD, Ruben     GERD (gastroesophageal reflux disease)    Hip pain, bilateral    ? lipomas   HIP PAIN, BILATERAL 04/05/2006   Annotation: since 1999 Qualifier: Diagnosis of  By: Conception Chancy MD, Hinton Dyer     Hypertension    Irregular menstruation    Left-sided back pain 04/15/2015   Lipoma of other skin and subcutaneous tissue 05/05/2008   Qualifier: Diagnosis of  By: Alfonse Alpers MD, Ruben     Lymphedema    chronic, since age 14   Mammogram abnormal    06, s/p biopsy of mass and neg work up for neoplasm   Neck pain on right side 06/25/2017   Need for prophylactic vaccination with combined diphtheria-tetanus-pertussis (DTP) vaccine 07/15/2012   Obesity    Polyarthropathy    inflammatory   Right ovarian cyst    s/p resection of r ovary 06   RLQ PAIN 03/15/2009   Qualifier: Diagnosis of  By: Eyvonne Mechanic MD, Vijay     Sebaceous cyst 07/15/2012   Shoulder pain 07/26/2015   Vaginal itching 12/27/2016   Wears glasses      Review of Systems:    Reports *** Denies *** (subjective fever?, pain anywhere?, bowel changes?)   Physical Exam:  There were no vitals filed for this visit.  General:   awake and alert, sitting comfortably in chair, cooperative, not in acute distress Skin:   warm and dry, intact without any obvious lesions or scars, no rashes or lesions  Head:   normocephalic and atraumatic, oral mucosa moist with good dentition, no lymphadenopathy Eyes:    extraocular movements intact, conjunctivae pink, pupils round and reactive to light, no periorbital swelling or scleral icterus Ears:   pinnae normal, no discharge or external lesions  Nose:   symmetrical and without mucosal inflammation, no external lesions or discharge Lungs:   normal respiratory effort, breathing unlabored, symmetrical chest rise, no crackles or wheezing Cardiac:   regular rate and rhythm, normal S1 and S2, capillary refill 2-3 seconds, dorsalis pedis pulses intact bilaterally, no pitting edema Abdomen:   soft and non-distended, normoactive bowel sounds present in all four quadrants, no guarding or palpable masses Musculoskeletal:   full range of motion in joints, motor strength 5 /5 in all four extremities, no obvious deformities or joint tenderness Neurologic:   oriented to person-place-time, moving all extremities, sensation to light touch intact, no facial droop Psychiatric:   mood and affect normal, intelligible speech    Assessment & Plan:   No problem-specific Assessment & Plan notes found for this encounter.     See Encounters Tab for problem based charting.  Patient {GC/GE:[id number]::"discussed with","seen with"} Dr. {NAMES:[id number]::"Guilloud","Hoffman","Mullen","Narendra","Williams","Vincent"}

## 2022-01-17 ENCOUNTER — Encounter: Payer: Self-pay | Admitting: Internal Medicine

## 2022-01-17 ENCOUNTER — Encounter: Payer: BC Managed Care – PPO | Admitting: Student

## 2022-01-26 ENCOUNTER — Ambulatory Visit: Payer: Self-pay

## 2022-01-26 NOTE — Patient Outreach (Signed)
  Care Coordination   01/26/2022 Name: Sharon Fisher MRN: [id number] DOB: [birth year]   Care Coordination Outreach Attempts:  An unsuccessful telephone outreach was attempted today to offer the patient information about available care coordination services as a benefit of their health plan.   Follow Up Plan:  Additional outreach attempts will be made to offer the patient care coordination information and services.   Encounter Outcome:  No Answer  Care Coordination Interventions Activated:  No   Care Coordination Interventions:  No, not indicated    Johnney Killian, RN, BSN, CCM Care Management Coordinator [facility] Phone: [phone]: [phone]

## 2022-01-29 ENCOUNTER — Other Ambulatory Visit (HOSPITAL_COMMUNITY): Payer: Self-pay

## 2022-01-29 ENCOUNTER — Other Ambulatory Visit: Payer: Self-pay | Admitting: Internal Medicine

## 2022-01-29 DIAGNOSIS — IMO0002 Reserved for concepts with insufficient information to code with codable children: Secondary | ICD-10-CM

## 2022-01-29 DIAGNOSIS — E785 Hyperlipidemia, unspecified: Secondary | ICD-10-CM

## 2022-01-29 DIAGNOSIS — E119 Type 2 diabetes mellitus without complications: Secondary | ICD-10-CM

## 2022-01-29 DIAGNOSIS — I10 Essential (primary) hypertension: Secondary | ICD-10-CM

## 2022-01-30 ENCOUNTER — Other Ambulatory Visit (HOSPITAL_COMMUNITY): Payer: Self-pay

## 2022-01-30 ENCOUNTER — Encounter: Payer: Self-pay | Admitting: *Deleted

## 2022-01-30 MED ORDER — GLIPIZIDE 5 MG PO TABS
5.0000 mg | ORAL_TABLET | Freq: Every day | ORAL | 1 refills | Status: DC
  Filled 2022-01-30: qty 90, 90d supply, fill #0

## 2022-01-30 MED ORDER — LISINOPRIL-HYDROCHLOROTHIAZIDE 20-12.5 MG PO TABS
1.0000 | ORAL_TABLET | Freq: Every day | ORAL | 11 refills | Status: DC
  Filled 2022-01-30: qty 30, 30d supply, fill #0
  Filled 2022-04-18 – 2022-04-27 (×2): qty 30, 30d supply, fill #1

## 2022-01-30 MED ORDER — ROSUVASTATIN CALCIUM 20 MG PO TABS
20.0000 mg | ORAL_TABLET | Freq: Every day | ORAL | 11 refills | Status: DC
  Filled 2022-01-30: qty 30, 30d supply, fill #0
  Filled 2022-04-18 – 2022-04-27 (×2): qty 30, 30d supply, fill #1

## 2022-02-06 ENCOUNTER — Other Ambulatory Visit (HOSPITAL_COMMUNITY): Payer: Self-pay

## 2022-02-14 ENCOUNTER — Ambulatory Visit (INDEPENDENT_AMBULATORY_CARE_PROVIDER_SITE_OTHER): Payer: BC Managed Care – PPO | Admitting: Internal Medicine

## 2022-02-14 ENCOUNTER — Other Ambulatory Visit (HOSPITAL_COMMUNITY): Payer: Self-pay

## 2022-02-14 VITALS — BP 131/74 | HR 90 | Temp 98.0°F | Ht 65.0 in | Wt 281.0 lb

## 2022-02-14 DIAGNOSIS — E1142 Type 2 diabetes mellitus with diabetic polyneuropathy: Secondary | ICD-10-CM

## 2022-02-14 DIAGNOSIS — B360 Pityriasis versicolor: Secondary | ICD-10-CM | POA: Diagnosis not present

## 2022-02-14 DIAGNOSIS — N952 Postmenopausal atrophic vaginitis: Secondary | ICD-10-CM

## 2022-02-14 DIAGNOSIS — N898 Other specified noninflammatory disorders of vagina: Secondary | ICD-10-CM

## 2022-02-14 DIAGNOSIS — Z7984 Long term (current) use of oral hypoglycemic drugs: Secondary | ICD-10-CM

## 2022-02-14 LAB — POCT GLYCOSYLATED HEMOGLOBIN (HGB A1C): Hemoglobin A1C: 8 % — AB (ref 4.0–5.6)

## 2022-02-14 LAB — GLUCOSE, CAPILLARY: Glucose-Capillary: 172 mg/dL — ABNORMAL HIGH (ref 70–99)

## 2022-02-14 MED ORDER — ITRACONAZOLE 100 MG PO CAPS
200.0000 mg | ORAL_CAPSULE | Freq: Every day | ORAL | 0 refills | Status: AC
Start: 2022-02-14 — End: ?
  Filled 2022-02-14: qty 10, 5d supply, fill #0

## 2022-02-14 MED ORDER — ESTRADIOL 0.1 MG/GM VA CREA
1.0000 | TOPICAL_CREAM | Freq: Every day | VAGINAL | 12 refills | Status: AC
  Filled 2022-02-14: qty 42.5, 90d supply, fill #0

## 2022-02-14 MED ORDER — GABAPENTIN 300 MG PO CAPS
300.0000 mg | ORAL_CAPSULE | Freq: Every evening | ORAL | 5 refills | Status: DC | PRN
  Filled 2022-02-14: qty 30, 30d supply, fill #0

## 2022-02-14 NOTE — Patient Instructions (Addendum)
Thank you, Ms.Ripley Fraise for allowing [place] to provide your care today.   Vaginal itching Please use estrogen cream at night before bed. Moisturizers such as Investment banker, corporate during intercourse. Follow-up in 2 weeks  Diabetes Your A1c is elevated to 8. Please take medications and I have included a hand out to help with diet changes that would be helpful.  I have ordered the following labs for you:  Lab Orders         Glucose, capillary         POC Hbg A1C       Referrals ordered today:   Referral Orders  No referral(s) requested today     I have ordered the following medication/changed the following medications:   Stop the following medications: Medications Discontinued During This Encounter  Medication Reason   metroNIDAZOLE (FLAGYL) 500 MG tablet Completed Course   oxyCODONE (OXY IR/ROXICODONE) 5 MG immediate release tablet Completed Course   doxycycline (MONODOX) 100 MG capsule Completed Course   naproxen (NAPROSYN) 500 MG tablet Completed Course   gabapentin (NEURONTIN) 300 MG capsule Reorder     Start the following medications: Meds ordered this encounter  Medications   gabapentin (NEURONTIN) 300 MG capsule    Sig: Take 1 capsule (300 mg total) by mouth at bedtime as needed (neuropathy in feet).    Dispense:  30 capsule    Refill:  5   estradiol (ESTRACE) 0.1 MG/GM vaginal cream    Sig: Place 1 Applicatorful vaginally at bedtime for 14 days.    Dispense:  42.5 g    Refill:  12     Follow up:  2 weeks    We look forward to seeing you next time. Please call our clinic at [phone] if you have any questions or concerns. The best time to call is Monday-Friday from 9am-4pm, but there is someone available 24/7. If after hours or the weekend, call the main hospital number and ask for the Internal Medicine Resident On-Call. If you need medication refills, please notify your pharmacy one week in advance and they will send [place] a request.   Thank you for trusting me with your  care. Wishing you the best!   Christiana Fuchs, [place]

## 2022-02-14 NOTE — Progress Notes (Signed)
Subjective:  CC: vaginal irritation  HPI:  [name] is a 51 y.o. female with a past medical history stated below and presents today for vaginal irritation and follow-up for diabetes. Please see problem based assessment and plan for additional details.  Past Medical History:  Diagnosis Date   Allergic rhinitis    Arthritis    hips   Diabetes mellitus    Dizziness 06/25/2017   Fatigue 04/15/2015   FRACTURE, CLAVICLE, RIGHT 11/22/2008   Qualifier: Diagnosis of  By: Alfonse Alpers MD, Audelia Hives     GERD (gastroesophageal reflux disease)    Hip pain, bilateral    ? lipomas   HIP PAIN, BILATERAL 04/05/2006   Annotation: since 1999 Qualifier: Diagnosis of  By: Conception Chancy MD, Hinton Dyer     Hypertension    Irregular menstruation    Left-sided back pain 04/15/2015   Lipoma of other skin and subcutaneous tissue 05/05/2008   Qualifier: Diagnosis of  By: Alfonse Alpers MD, Ruben     Lymphedema    chronic, since age 57   Mammogram abnormal    06, s/p biopsy of mass and neg work up for neoplasm   Neck pain on right side 06/25/2017   Need for prophylactic vaccination with combined diphtheria-tetanus-pertussis (DTP) vaccine 07/15/2012   Obesity    Polyarthropathy    inflammatory   Right ovarian cyst    s/p resection of r ovary 06   RLQ PAIN 03/15/2009   Qualifier: Diagnosis of  By: Eyvonne Mechanic MD, Vijay     Sebaceous cyst 07/15/2012   Shoulder pain 07/26/2015   Vaginal itching 12/27/2016   Wears glasses     Current Outpatient Medications on File Prior to Visit  Medication Sig Dispense Refill   acetaminophen (TYLENOL) 500 MG tablet Take 2 tablets (1,000 mg total) by mouth every 8 (eight) hours as needed (pain). 30 tablet 0   ferrous sulfate 325 (65 FE) MG tablet Take 325 mg by mouth daily with breakfast.     glipiZIDE (GLUCOTROL) 5 MG tablet Take 1 tablet (5 mg total) by mouth daily before breakfast. 180 tablet 1   glucose blood (CONTOUR NEXT TEST) test strip Use up to two times per day. 100  each 4   ibuprofen (ADVIL) 800 MG tablet Take 1 tablet (800 mg total) by mouth every 8 (eight) hours as needed. 30 tablet 0   Lancets MISC Use up to two times per day 100 each 4   lisinopril-hydrochlorothiazide (ZESTORETIC) 20-12.5 MG tablet Take 1 tablet by mouth daily. 30 tablet 11   metFORMIN (GLUCOPHAGE) 1000 MG tablet Take 1 tablet (1,000 mg total) by mouth 2 (two) times daily with a meal. 180 tablet 1   polyethylene glycol (MIRALAX / GLYCOLAX) 17 g packet Take 17 g by mouth 2 (two) times daily.     rosuvastatin (CRESTOR) 20 MG tablet Take 1 tablet (20 mg total) by mouth daily. 30 tablet 11   Semaglutide 14 MG TABS Take 1 tablet by mouth daily. 90 tablet 3   [DISCONTINUED] enalapril (VASOTEC) 20 MG tablet TAKE 1 TABLET BY MOUTH DAILY. 90 tablet 3   No current facility-administered medications on file prior to visit.    Family History  Problem Relation Age of Onset   Hypertension Mother    Cancer Mother        breast cancer   Anemia Mother    Sickle cell trait Sister    Cancer Maternal Uncle        breast cancer  Cancer Paternal Grandmother        lung cancer   Hypertension Paternal Grandmother    Diabetes Paternal Grandmother    Colon cancer Neg Hx    Colon polyps Neg Hx    Esophageal cancer Neg Hx    Rectal cancer Neg Hx    Stomach cancer Neg Hx     Social History   Socioeconomic History   Marital status: Married    Spouse name: Lenni Reckner   Number of children: 1   Years of education: 10   Highest education level: Not on file  Occupational History   Not on file  Tobacco Use   Smoking status: Former    Types: Cigarettes    Quit date: 1995    Years since quitting: 28.7   Smokeless tobacco: Never   Tobacco comments:    Quit 1995.  Vaping Use   Vaping Use: Never used  Substance and Sexual Activity   Alcohol use: No    Alcohol/week: 0.0 standard drinks of alcohol   Drug use: No   Sexual activity: Yes  Other Topics Concern   Not on file  Social History  Narrative   Works as a Building control surveyor.   Lives at home with husband and daughter.   Social Determinants of Health   Financial Resource Strain: Not on file  Food Insecurity: No Food Insecurity (06/08/2021)   Hunger Vital Sign    Worried About Running Out of Food in the Last Year: Never true    Ran Out of Food in the Last Year: Never true  Transportation Needs: No Transportation Needs (06/08/2021)   PRAPARE - Hydrologist (Medical): No    Lack of Transportation (Non-Medical): No  Physical Activity: Not on file  Stress: Not on file  Social Connections: Not on file  Intimate Partner Violence: Not on file    Review of Systems: ROS negative except for what is noted on the assessment and plan.  Objective:   Vitals:   02/14/22 1537  BP: 131/74  Pulse: 90  Temp: 98 F (36.7 C)  TempSrc: Oral  SpO2: 100%  Weight: 281 lb (127.5 kg)  Height: '5\' 5"'$  (1.651 m)    Physical Exam: Constitutional: well-appearing, in no acute distress Cardiovascular: regular rate and rhythm, no m/r/g Pulmonary/Chest: normal work of breathing on room air, lungs clear to auscultation bilaterally GU: Dry and atropied appearing external gentilia, dry appearing vaginal mucosa. No discharge present or tear. Female nursing staff present during patient's pelvic examination   Assessment & Plan:  Type 2 diabetes mellitus with peripheral neuropathy ([place]) Diabetes is not well-controlled. A1c from 7.3 to 8.0. medications include metformin 1000 mg BID, semaglutide PO 14 mg, and glipizide 5 mg. On chart review appears that she recently filled medication, but likely has been out for some time. I talked with her about medications and she states that she is not having side effects. P: -metformin 1000 mg BID -glipizide 5 mg qd -semaglutide PO 14 mg -repeat A1c in December 2023  If DM remains uncontrolled despite being adherent with medications could consider switching PO GLP-1 to Injectable as this  would also help with weight loss goal  Tinea versicolor Rash on left elbow and face (see media) for last several months. This is itchy. On exam rash is hypopigmented. A/P: Exam consistent with tinea versicolor. With rash being around eyes will treat with PO rather than cream. -itraconazole '200mg'$  for 5 days  Vaginal itching Presents with vaginal  irritation for 2-3 days. She and her husband have been more sexually active recently. She is concerned for yeast infection due to irritation. Denies discharge. She is perimenopausal and does feel that she is very dry. She uses vaseline during intercourse to help with this.  Dry and atropied appearing external gentilia, dry appearing vaginal mucosa. No discharge present or tear. Female nursing staff present during patient's pelvic examination  A/P: Exam consistent with vaginal atrophy. I talked with her about irritation likely being from this. We discussed using KY Jelly or other lubricant during intercourse. This has been an ongoing problem for her for the last year and she is interested in estrogen cream as well.  -estrogen cream nightly right before bed for 2 weeks. Patient encourage to only apply this right before sleeping and to wash hands well after application. -follow-up in 2 weeks     Patient discussed with Dr. Walden Field Coraima Tibbs, D.O. [facility]  PGY-2 Pager: [phone]  Phone: [phone] Date 02/15/2022  Time 5:51 PM

## 2022-02-15 ENCOUNTER — Other Ambulatory Visit (HOSPITAL_COMMUNITY): Payer: Self-pay

## 2022-02-15 DIAGNOSIS — B360 Pityriasis versicolor: Secondary | ICD-10-CM | POA: Insufficient documentation

## 2022-02-15 NOTE — Assessment & Plan Note (Signed)
Presents with vaginal irritation for 2-3 days. She and her husband have been more sexually active recently. She is concerned for yeast infection due to irritation. Denies discharge. She is perimenopausal and does feel that she is very dry. She uses vaseline during intercourse to help with this.  Dry and atropied appearing external gentilia, dry appearing vaginal mucosa. No discharge present or tear. Female nursing staff present during patient's pelvic examination  A/P: Exam consistent with vaginal atrophy. I talked with her about irritation likely being from this. We discussed using KY Jelly or other lubricant during intercourse. This has been an ongoing problem for her for the last year and she is interested in estrogen cream as well.  -estrogen cream nightly right before bed for 2 weeks. Patient encourage to only apply this right before sleeping and to wash hands well after application. -follow-up in 2 weeks

## 2022-02-15 NOTE — Assessment & Plan Note (Addendum)
Diabetes is not well-controlled. A1c from 7.3 to 8.0. medications include metformin 1000 mg BID, semaglutide PO 14 mg, and glipizide 5 mg. On chart review appears that she recently filled medication, but likely has been out for some time. I talked with her about medications and she states that she is not having side effects. P: -metformin 1000 mg BID -glipizide 5 mg qd -semaglutide PO 14 mg -repeat A1c in December 2023  If DM remains uncontrolled despite being adherent with medications could consider switching PO GLP-1 to Injectable as this would also help with weight loss goal

## 2022-02-15 NOTE — Assessment & Plan Note (Signed)
Rash on left elbow and face (see media) for last several months. This is itchy. On exam rash is hypopigmented. A/P: Exam consistent with tinea versicolor. With rash being around eyes will treat with PO rather than cream. -itraconazole '200mg'$  for 5 days

## 2022-02-20 NOTE — Progress Notes (Signed)
Internal Medicine Clinic Attending  Case discussed with Dr. Masters  at the time of the visit.  We reviewed the resident's history and exam and pertinent patient test results.  I agree with the assessment, diagnosis, and plan of care documented in the resident's note.  

## 2022-03-29 ENCOUNTER — Other Ambulatory Visit (HOSPITAL_COMMUNITY): Payer: Self-pay

## 2022-03-29 ENCOUNTER — Other Ambulatory Visit: Payer: Self-pay | Admitting: Internal Medicine

## 2022-03-29 MED ORDER — IBUPROFEN 800 MG PO TABS: 800.0000 mg | ORAL_TABLET | Freq: Three times a day (TID) | ORAL | 0 refills | Status: AC | PRN

## 2022-03-29 NOTE — Telephone Encounter (Signed)
I called and talked with patient about rx request. She is continuing to have left hip pain. Xray completed 7/22 for this. I talked with her about not being about to take ibprufen more than a few days in a row.   Please help patient in making follow-up next week about left hip. 12 tablets sent to [facility] on [address].

## 2022-04-03 NOTE — Telephone Encounter (Signed)
Pt called to schedule a f/u appt per Dr Howie Ill - she's agreeable. Call transferred to front office - appt schedule w/Dr Masters on Tues 11/7.

## 2022-04-10 ENCOUNTER — Encounter: Payer: BC Managed Care – PPO | Admitting: Internal Medicine

## 2022-04-19 ENCOUNTER — Other Ambulatory Visit (HOSPITAL_COMMUNITY): Payer: Self-pay

## 2022-04-27 ENCOUNTER — Other Ambulatory Visit (HOSPITAL_COMMUNITY): Payer: Self-pay

## 2022-05-08 ENCOUNTER — Telehealth: Payer: Self-pay | Admitting: General Practice

## 2022-05-08 ENCOUNTER — Other Ambulatory Visit (HOSPITAL_COMMUNITY): Payer: Self-pay

## 2022-05-08 DIAGNOSIS — B379 Candidiasis, unspecified: Secondary | ICD-10-CM

## 2022-05-08 MED ORDER — FLUCONAZOLE 150 MG PO TABS
150.0000 mg | ORAL_TABLET | Freq: Once | ORAL | 0 refills | Status: AC
  Filled 2022-05-08: qty 1, 1d supply, fill #0

## 2022-05-08 NOTE — Telephone Encounter (Signed)
Patient called and left message on nurse voicemail line stating she is having itching and thinks she has a yeast infection. She reports being given a cream for vaginal dryness but it isn't helping.  Called patient and she reports a history of frequent yeast infections. She reports discharge with itching. Diflucan sent to pharmacy per protocol and patient verbalized understanding.

## 2022-05-10 ENCOUNTER — Other Ambulatory Visit (HOSPITAL_COMMUNITY): Payer: Self-pay

## 2022-05-10 ENCOUNTER — Other Ambulatory Visit: Payer: Self-pay | Admitting: Student

## 2022-05-16 ENCOUNTER — Other Ambulatory Visit: Payer: Self-pay | Admitting: Student

## 2022-05-16 ENCOUNTER — Other Ambulatory Visit: Payer: Self-pay | Admitting: Internal Medicine

## 2022-05-16 ENCOUNTER — Other Ambulatory Visit: Payer: Self-pay

## 2022-05-16 ENCOUNTER — Other Ambulatory Visit (HOSPITAL_COMMUNITY): Payer: Self-pay

## 2022-05-16 DIAGNOSIS — E1142 Type 2 diabetes mellitus with diabetic polyneuropathy: Secondary | ICD-10-CM

## 2022-05-16 MED ORDER — METFORMIN HCL 1000 MG PO TABS
1000.0000 mg | ORAL_TABLET | Freq: Two times a day (BID) | ORAL | 3 refills | Status: DC
  Filled 2022-05-16: qty 180, 90d supply, fill #0

## 2022-05-16 MED ORDER — SEMAGLUTIDE 14 MG PO TABS
1.0000 | ORAL_TABLET | Freq: Every day | ORAL | 3 refills | Status: DC
  Filled 2022-05-16: qty 90, 90d supply, fill #0
  Filled 2022-08-17: qty 90, 90d supply, fill #1

## 2022-05-17 ENCOUNTER — Other Ambulatory Visit (HOSPITAL_COMMUNITY): Payer: Self-pay

## 2022-05-29 ENCOUNTER — Other Ambulatory Visit (HOSPITAL_COMMUNITY): Payer: Self-pay

## 2022-06-14 ENCOUNTER — Other Ambulatory Visit: Payer: Self-pay

## 2022-06-14 ENCOUNTER — Other Ambulatory Visit (HOSPITAL_COMMUNITY): Payer: Self-pay

## 2022-06-14 ENCOUNTER — Encounter: Payer: Self-pay | Admitting: Student

## 2022-06-14 ENCOUNTER — Ambulatory Visit (INDEPENDENT_AMBULATORY_CARE_PROVIDER_SITE_OTHER): Payer: BC Managed Care – PPO | Admitting: Student

## 2022-06-14 VITALS — BP 142/79 | HR 90 | Temp 98.3°F | Ht 65.0 in | Wt 280.4 lb

## 2022-06-14 DIAGNOSIS — Z1159 Encounter for screening for other viral diseases: Secondary | ICD-10-CM | POA: Diagnosis not present

## 2022-06-14 DIAGNOSIS — E119 Type 2 diabetes mellitus without complications: Secondary | ICD-10-CM

## 2022-06-14 DIAGNOSIS — E1142 Type 2 diabetes mellitus with diabetic polyneuropathy: Secondary | ICD-10-CM | POA: Diagnosis not present

## 2022-06-14 DIAGNOSIS — I10 Essential (primary) hypertension: Secondary | ICD-10-CM | POA: Diagnosis not present

## 2022-06-14 DIAGNOSIS — Z7984 Long term (current) use of oral hypoglycemic drugs: Secondary | ICD-10-CM

## 2022-06-14 DIAGNOSIS — M79671 Pain in right foot: Secondary | ICD-10-CM | POA: Diagnosis not present

## 2022-06-14 DIAGNOSIS — Z114 Encounter for screening for human immunodeficiency virus [HIV]: Secondary | ICD-10-CM

## 2022-06-14 DIAGNOSIS — E785 Hyperlipidemia, unspecified: Secondary | ICD-10-CM | POA: Diagnosis not present

## 2022-06-14 DIAGNOSIS — Z87891 Personal history of nicotine dependence: Secondary | ICD-10-CM

## 2022-06-14 LAB — POCT GLYCOSYLATED HEMOGLOBIN (HGB A1C): Hemoglobin A1C: 8.5 % — AB (ref 4.0–5.6)

## 2022-06-14 LAB — GLUCOSE, CAPILLARY: Glucose-Capillary: 196 mg/dL — ABNORMAL HIGH (ref 70–99)

## 2022-06-14 MED ORDER — GLIPIZIDE 5 MG PO TABS
5.0000 mg | ORAL_TABLET | Freq: Every day | ORAL | 1 refills | Status: AC
  Filled 2022-06-14: qty 90, 90d supply, fill #0

## 2022-06-14 MED ORDER — LISINOPRIL-HYDROCHLOROTHIAZIDE 20-12.5 MG PO TABS
1.0000 | ORAL_TABLET | Freq: Every day | ORAL | 11 refills | Status: AC
  Filled 2022-06-14: qty 30, 30d supply, fill #0
  Filled 2022-08-17: qty 30, 30d supply, fill #1
  Filled 2022-10-19: qty 30, 30d supply, fill #2
  Filled 2022-12-12: qty 30, 30d supply, fill #3

## 2022-06-14 MED ORDER — METFORMIN HCL 1000 MG PO TABS
1000.0000 mg | ORAL_TABLET | Freq: Two times a day (BID) | ORAL | 3 refills | Status: AC
Start: 2022-06-14 — End: ?
  Filled 2022-06-14 – 2022-10-19 (×2): qty 180, 90d supply, fill #0

## 2022-06-14 MED ORDER — ROSUVASTATIN CALCIUM 20 MG PO TABS
20.0000 mg | ORAL_TABLET | Freq: Every day | ORAL | 11 refills | Status: AC
Start: 2022-06-14 — End: ?
  Filled 2022-06-14: qty 90, 90d supply, fill #0
  Filled 2022-10-19: qty 90, 90d supply, fill #1

## 2022-06-14 NOTE — Patient Instructions (Addendum)
For your heel pain, 7d of ibuprofen, ice as needed, use extra cushion behind your heel. You can also purchase a heel lift from the pharmacy.   For your diabetes we will continue your current medications for now. And will discuss adding a medication like ozempic and/or jardiance during your next appointment. Please call and schedule an appointment with our clinic and with [facility].

## 2022-06-14 NOTE — Progress Notes (Signed)
   CC: F/u T2DM, R foot pain   HPI:  Sharon Fisher is a 52 y.o. F with PMH per below who presents for her T2DM and R foot pain. She states that she has had R foot pain for the past 2 months. She does not remember any trauma or other events around that time that precipitated her pain. The pain is constant and is worse with sitting or getting out of bed. She has tried tylenol to minimal effect but notes ambulation does help her symptoms. She has noticed no skin changes of the overlying skin, no masses.    Regarding her diabetes and HTN. Patient notes that she has been intermittently adherent with her medications Past Medical History:  Diagnosis Date   Allergic rhinitis    Arthritis    hips   Diabetes mellitus    Dizziness 06/25/2017   Fatigue 04/15/2015   FRACTURE, CLAVICLE, RIGHT 11/22/2008   Qualifier: Diagnosis of  By: Alfonse Alpers MD, Audelia Hives     GERD (gastroesophageal reflux disease)    Hip pain, bilateral    ? lipomas   HIP PAIN, BILATERAL 04/05/2006   Annotation: since 1999 Qualifier: Diagnosis of  By: Conception Chancy MD, Hinton Dyer     Hypertension    Irregular menstruation    Left-sided back pain 04/15/2015   Lipoma of other skin and subcutaneous tissue 05/05/2008   Qualifier: Diagnosis of  By: Alfonse Alpers MD, Ruben     Lymphedema    chronic, since age 19   Mammogram abnormal    06, s/p biopsy of mass and neg work up for neoplasm   Neck pain on right side 06/25/2017   Need for prophylactic vaccination with combined diphtheria-tetanus-pertussis (DTP) vaccine 07/15/2012   Obesity    Polyarthropathy    inflammatory   Right ovarian cyst    s/p resection of r ovary 06   RLQ PAIN 03/15/2009   Qualifier: Diagnosis of  By: Eyvonne Mechanic MD, Vijay     Sebaceous cyst 07/15/2012   Shoulder pain 07/26/2015   Vaginal itching 12/27/2016   Wears glasses    Review of Systems:  Negative except per above   Physical Exam:  Vitals:   06/14/22 1617  BP: (!) 142/79  Pulse: 90  Temp: 98.3 F (36.8 C)   TempSrc: Oral  SpO2: 100%  Weight: 280 lb 6.4 oz (127.2 kg)  Height: '5\' 5"'$  (1.651 m)   Physical Exam  Constitutional: Well-developed, well-nourished, and in no distress.  Cardiovascular: Normal rate, regular rhythm, intact distal pulses. No gallop and no friction rub.  No murmur heard.  Pulmonary: Non labored breathing on room air, no wheezing or rales  Abdominal: Soft. Normal bowel sounds. Non distended and non tender Musculoskeletal: Normal range of motion.   R foot with no pain elicited with plantar flexion or dorsiflexion. Plantar surface with no TTP.     General: No tenderness, Trace edema of LLE, 1+ pitting edema of RLE (chronic)  Neurological: Alert and oriented to person, place, and time. Non focal  Skin: Skin is warm and dry.    Assessment & Plan:   See Encounters Tab for problem based charting.  Patient discussed with Dr. Dareen Piano

## 2022-06-15 ENCOUNTER — Other Ambulatory Visit (HOSPITAL_COMMUNITY): Payer: Self-pay

## 2022-06-15 LAB — LIPID PANEL
Chol/HDL Ratio: 3.7 ratio (ref 0.0–4.4)
Cholesterol, Total: 123 mg/dL (ref 100–199)
HDL: 33 mg/dL — ABNORMAL LOW (ref >39)
LDL Chol Calc (NIH): 61 mg/dL (ref 0–99)
Triglycerides: 173 mg/dL — ABNORMAL HIGH (ref 0–149)
VLDL Cholesterol Cal: 29 mg/dL (ref 5–40)

## 2022-06-15 LAB — MICROALBUMIN / CREATININE URINE RATIO
Creatinine, Urine: 207 mg/dL
Microalb/Creat Ratio: 8 mg/g creat (ref 0–29)
Microalbumin, Urine: 15.9 ug/mL

## 2022-06-15 LAB — HIV ANTIBODY (ROUTINE TESTING W REFLEX): HIV Screen 4th Generation wRfx: NONREACTIVE

## 2022-06-15 LAB — HCV INTERPRETATION

## 2022-06-15 LAB — HCV AB W REFLEX TO QUANT PCR: HCV Ab: NONREACTIVE

## 2022-06-19 ENCOUNTER — Other Ambulatory Visit (HOSPITAL_COMMUNITY): Payer: Self-pay

## 2022-06-19 DIAGNOSIS — M79671 Pain in right foot: Secondary | ICD-10-CM | POA: Insufficient documentation

## 2022-06-19 NOTE — Assessment & Plan Note (Signed)
Patient's R hindfoot pain is likely due to plantar fasciitis v calcaneal bursitis. Discussed with patient to avoid activities or positions that worsen the pain. Try ibuprofen for 7d and ice. Also discussed footwear and orthotics that can help with her symptoms.

## 2022-06-19 NOTE — Assessment & Plan Note (Signed)
Patient's BP elevated this clinic visit in the setting of nonadherence with her medications. She states she last took her medications about 3 weeks ago. She denies any intolerance or issues with obtaining her medications.  -Refill was sent for her lisinopril-HCTz (20-12.'5mg'$ ).

## 2022-06-19 NOTE — Assessment & Plan Note (Signed)
A1c 8.5 up from 8. She attributes this to the holidays. And she is intermittently adherent with her medications. She is on metformin 1g BID, Rybelsus,  and glipizide '5mg'$  qd.   Discussed with patient that we could start a SGLTi or GLP1 agonist (injectable) she would like to think about this before starting. She will come back in 2 weeks for follow up regarding this.

## 2022-06-20 NOTE — Addendum Note (Signed)
Addended by: Aldine Contes on: 06/20/2022 09:46 PM   Modules accepted: Level of Service

## 2022-06-20 NOTE — Progress Notes (Signed)
Internal Medicine Clinic Attending  Case discussed with Dr. Carter  At the time of the visit.  We reviewed the resident's history and exam and pertinent patient test results.  I agree with the assessment, diagnosis, and plan of care documented in the resident's note.  

## 2022-08-17 ENCOUNTER — Other Ambulatory Visit (HOSPITAL_COMMUNITY): Payer: Self-pay

## 2022-08-28 ENCOUNTER — Telehealth: Payer: Self-pay | Admitting: Family Medicine

## 2022-08-28 NOTE — Telephone Encounter (Signed)
Patient is requesting for a RX to be called in for a vaginal inf

## 2022-08-29 NOTE — Telephone Encounter (Signed)
Call placed back to pt. Pt did not answer. Left VM to return call to office or send mychart message.  Colletta Maryland, RNC

## 2022-08-29 NOTE — Telephone Encounter (Signed)
Call placed to pt. Spoke with pt. Pt states having vaginal itching around clitoral area. Pt states having vaginal dryness. Has used estrogen cream and has not resolved it. Denies any vaginal discharge or odor. Pt states will use cream for some relief until appt.  Pt has Annual exam on 5/1, but would like sooner appt. Pt given appt with Dr Ernestina Patches on 4/8 at 255pm. Pt verbalized understanding and agreeable to plan of care.  Colletta Maryland, RNC

## 2022-09-07 ENCOUNTER — Emergency Department (HOSPITAL_COMMUNITY)
Admission: EM | Admit: 2022-09-07 | Discharge: 2022-09-07 | Disposition: A | Discharge status: Home / Self Care | Payer: BC Managed Care – PPO | Attending: Emergency Medicine | Admitting: Emergency Medicine

## 2022-09-07 ENCOUNTER — Encounter (HOSPITAL_COMMUNITY): Payer: Self-pay

## 2022-09-07 DIAGNOSIS — H9202 Otalgia, left ear: Secondary | ICD-10-CM | POA: Diagnosis not present

## 2022-09-07 DIAGNOSIS — H66002 Acute suppurative otitis media without spontaneous rupture of ear drum, left ear: Secondary | ICD-10-CM | POA: Insufficient documentation

## 2022-09-07 DIAGNOSIS — H60502 Unspecified acute noninfective otitis externa, left ear: Secondary | ICD-10-CM | POA: Insufficient documentation

## 2022-09-07 MED ORDER — AMOXICILLIN 500 MG PO CAPS: 1000.0000 mg | ORAL_CAPSULE | Freq: Two times a day (BID) | ORAL | 0 refills | Status: AC

## 2022-09-07 MED ORDER — CIPRO HC 0.2-1 % OT SUSP: 3.0000 [drp] | Freq: Two times a day (BID) | OTIC | 0 refills | Status: AC

## 2022-09-07 NOTE — ED Triage Notes (Signed)
Pt arrived via POV, c/o left ear pain and pressure

## 2022-09-07 NOTE — ED Provider Notes (Signed)
[place] EMERGENCY DEPARTMENT AT [facility] Provider Note   CSN: [id number] Arrival date & time: 09/07/22  2058     History Chief Complaint  Patient presents with   Otalgia    Sharon Fisher is a 52 y.o. female.  Patient presents emerged from complaints of left ear pain.  She reports has been present for the last few days has been progressively worsening.  She reports having some radiating pain down to the left side of her neck. She denies any recent fevers, dental pain, difficulty swallowing, abdominal pain, diarrhea, nausea, or vomiting. She previously has had ear tubes multiple times due to recurrent ear infections but this was when she was a child.   Otalgia      Home Medications Prior to Admission medications   Medication Sig Start Date End Date Taking? Authorizing Provider  amoxicillin (AMOXIL) 500 MG capsule Take 2 capsules (1,000 mg total) by mouth 2 (two) times daily for 7 days. 09/07/22 09/14/22 Yes Smitty Knudsen, PA-C  ciprofloxacin-hydrocortisone (CIPRO HC) OTIC suspension Place 3 drops into the left ear 2 (two) times daily for 7 days. 09/07/22 09/14/22 Yes Smitty Knudsen, PA-C  acetaminophen (TYLENOL) 500 MG tablet Take 2 tablets (1,000 mg total) by mouth every 8 (eight) hours as needed (pain). 11/19/16   John Giovanni, MD  ferrous sulfate 325 (65 FE) MG tablet Take 325 mg by mouth daily with breakfast.    [provider]  gabapentin (NEURONTIN) 300 MG capsule Take 1 capsule (300 mg total) by mouth at bedtime as needed (neuropathy in feet). 02/14/22   Masters, Florentina Addison, DO  glipiZIDE (GLUCOTROL) 5 MG tablet Take 1 tablet (5 mg total) by mouth daily before breakfast. 06/14/22   Marolyn Haller, MD  glucose blood (CONTOUR NEXT TEST) test strip Use up to two times per day. 08/20/18   Seawell, Jaimie A, DO  ibuprofen (ADVIL) 800 MG tablet Take 1 tablet (800 mg total) by mouth every 8 (eight) hours as needed. 03/29/22   Masters, Florentina Addison, DO  itraconazole  (SPORANOX) 100 MG capsule Take 2 capsules (200 mg total) by mouth daily for 5 days 02/14/22   Masters, Florentina Addison, DO  Lancets MISC Use up to two times per day 08/20/18   Seawell, Jaimie A, DO  lisinopril-hydrochlorothiazide (ZESTORETIC) 20-12.5 MG tablet Take 1 tablet by mouth daily. 06/14/22   Marolyn Haller, MD  metFORMIN (GLUCOPHAGE) 1000 MG tablet Take 1 tablet (1,000 mg total) by mouth 2 (two) times daily with a meal. 06/14/22   Marolyn Haller, MD  polyethylene glycol (MIRALAX / GLYCOLAX) 17 g packet Take 17 g by mouth 2 (two) times daily.    [provider]  rosuvastatin (CRESTOR) 20 MG tablet Take 1 tablet (20 mg total) by mouth daily. 06/14/22   Marolyn Haller, MD  Semaglutide 14 MG TABS Take 1 tablet by mouth daily. 05/16/22   Masters, Katie, DO  enalapril (VASOTEC) 20 MG tablet TAKE 1 TABLET BY MOUTH DAILY. 05/02/20 05/23/20  Rehman, Areeg N, DO      Allergies    Shellfish allergy    Review of Systems   Review of Systems  HENT:  Positive for ear pain.   All other systems reviewed and are negative.   Physical Exam Updated Vital Signs BP (!) 185/103 (BP Location: Right Arm)   Pulse 81   Temp 98.1 F (36.7 C) (Oral)   Resp 18   Ht 5\' 5"  (1.651 m)   Wt 127 kg   SpO2  93%   BMI 46.59 kg/m  Physical Exam Vitals and nursing note reviewed.  HENT:     Head: Normocephalic and atraumatic.     Right Ear: No swelling. There is no impacted cerumen. No mastoid tenderness. No PE tube. Tympanic membrane is scarred. Tympanic membrane is not injected or perforated.     Left Ear: Swelling and tenderness present. A middle ear effusion is present. There is no impacted cerumen. No mastoid tenderness. No PE tube. Tympanic membrane is bulging. Tympanic membrane is not injected or perforated.  Eyes:     Conjunctiva/sclera: Conjunctivae normal.  Skin:    General: Skin is warm and dry.     Capillary Refill: Capillary refill takes less than 2 seconds.     ED Results / Procedures /  Treatments   Labs (all labs ordered are listed, but only abnormal results are displayed) Labs Reviewed - No data to display  EKG None  Radiology No results found.  Procedures Procedures   Medications Ordered in ED Medications - No data to display  ED Course/ Medical Decision Making/ A&P                           Medical Decision Making Risk Prescription drug management.   This patient presents to the ED for concern of ear pain. Differential diagnosis includes otitis media, otitis externa, malignant otitis externa, peritonsillar abscess, viral URI   Problem List / ED Course:  Patient presented to the emergency department with complaints of left ear pain.  She reports this started a few days ago and has progressively been worsening. Has not tried taking anything up to this point. On my examination, the right ear appears normal without acute abnormalities on outer ear, ear canal, or at TM. Left ear externally is tender at tragus, canal is swollen but non-erythematous, and TM is erythematous with effusion present behind TM without TM rupture. Will manage this as otitis externa and otitis media occurring concurrently. Will manage this with Cipro HC and amoxicillin. Advised patient to follow up with PCP to ensure that symptoms are resolving and return to the ED if symptoms are worsening. Patient agreeable with treatment plan and verbalized understanding all return precautions. All questions answered prior to patient discharge.  Final Clinical Impression(s) / ED Diagnoses Final diagnoses:  Acute otitis externa of left ear, unspecified type  Non-recurrent acute suppurative otitis media of left ear without spontaneous rupture of tympanic membrane    Rx / DC Orders ED Discharge Orders          Ordered    ciprofloxacin-hydrocortisone (CIPRO HC) OTIC suspension  2 times daily        09/07/22 2154    amoxicillin (AMOXIL) 500 MG capsule  2 times daily        09/07/22 2154               Salomon Mast 09/07/22 2214    Arby Barrette, MD 09/08/22 Rickey Primus

## 2022-09-07 NOTE — Discharge Instructions (Addendum)
You were seen in the emergency department for an ear infection. It appears that you have both an external and internal ear infection happening at the same time in the left ear. I have sent in two antibiotics for you to take for the next 7 days. One is an antibiotic ear drop and the other is a pill you swallow. Please take these two medications as prescribed in their entirety to ensure you have resolution of your symptoms. Please follow up with your primary care provider to ensure your symptoms have resolved or return to the emergency department if your symptoms are worsening.

## 2022-09-10 ENCOUNTER — Other Ambulatory Visit (HOSPITAL_COMMUNITY)
Admission: RE | Admit: 2022-09-10 | Discharge: 2022-09-10 | Disposition: A | Discharge status: Home / Self Care | Payer: BC Managed Care – PPO | Source: Ambulatory Visit | Attending: Family Medicine | Admitting: Family Medicine

## 2022-09-10 ENCOUNTER — Ambulatory Visit (INDEPENDENT_AMBULATORY_CARE_PROVIDER_SITE_OTHER): Payer: BC Managed Care – PPO | Admitting: Family Medicine

## 2022-09-10 ENCOUNTER — Encounter: Payer: Self-pay | Admitting: Family Medicine

## 2022-09-10 ENCOUNTER — Other Ambulatory Visit: Payer: Self-pay

## 2022-09-10 VITALS — BP 141/87 | HR 86 | Ht 66.0 in | Wt 279.4 lb

## 2022-09-10 DIAGNOSIS — N898 Other specified noninflammatory disorders of vagina: Secondary | ICD-10-CM | POA: Insufficient documentation

## 2022-09-10 DIAGNOSIS — N760 Acute vaginitis: Secondary | ICD-10-CM

## 2022-09-10 DIAGNOSIS — B9689 Other specified bacterial agents as the cause of diseases classified elsewhere: Secondary | ICD-10-CM

## 2022-09-10 DIAGNOSIS — N912 Amenorrhea, unspecified: Secondary | ICD-10-CM

## 2022-09-10 MED ORDER — NYSTATIN 100000 UNIT/GM EX CREA: 1.0000 | TOPICAL_CREAM | Freq: Two times a day (BID) | CUTANEOUS | 0 refills | Status: AC

## 2022-09-10 NOTE — Progress Notes (Signed)
   GYNECOLOGY PROBLEM  VISIT ENCOUNTER NOTE  Subjective:   Sharon Fisher is a 52 y.o. [id number] female here for a problem GYN visit.  Current complaints:  Chief Complaint  Patient presents with   Gynecologic Exam   Here with vaginal itching located at the top of her vagina, near her clitoris. Reports it has been off and on for at least 1 year. She thinks she might be going through the change of life. She reports LMP was about 6 mon ago. She was getting cycles every 3 months. She reports the itching waxes and wanes, somewhat related to timing of sex. She reports vaseline helps the itching. She was using a cream, unsure of the name that was prescribed here at [facility] severel years ago. Reports that applying the cream will make the area burn. She denies burning or stinging with urination. She reports normal bladder emptying and denies leaking.  Denies abnormal vaginal bleeding, discharge, pelvic pain, problems with intercourse or other gynecologic concerns.    Gynecologic History No LMP recorded (lmp unknown). (Menstrual status: Perimenopausal).  Contraception: post menopausal status  Health Maintenance Due  Topic Date Due   Zoster Vaccines- Shingrix (1 of 2) Never done   COVID-19 Vaccine (3 - 2023-24 season) 02/02/2022   OPHTHALMOLOGY EXAM  03/08/2022   DTaP/Tdap/Td (2 - Td or Tdap) 07/15/2022   Diabetic kidney evaluation - eGFR measurement  09/21/2022    The following portions of the patient's history were reviewed and updated as appropriate: allergies, current medications, past family history, past medical history, past social history, past surgical history and problem list.  Review of Systems Pertinent items are noted in HPI.   Objective:  BP (!) 141/87   Pulse 86   Ht 5\' 6"  (1.676 m)   Wt 279 lb 6.4 oz (126.7 kg)   LMP  (LMP Unknown)   BMI 45.10 kg/m  Gen: well appearing, NAD HEENT: no scleral icterus CV: RR Lung: Normal WOB Ext: warm well perfused  PELVIC: Normal  appearing external genitalia-normal hair distribution; White, removal discharge in clitoral hood (collected sample).  No white patches or other skin changes noted. Atrophic appearing vaginal mucosa and cervix with normal white discharge (collected second sample).  No abnormal discharge noted.  Slight cystocele anteriorly in vagina. Normal uterine size, no other palpable masses, no uterine or adnexal tenderness.   Assessment and Plan:   1. Vaginal itching Likely yeast in clitoral hood vs atrophic vaginitis. Exam favors a benign process and low risk for vulvar pathology.  - Cervicovaginal ancillary only( [place])-- clitoral hood - Will trial nystatin cream on clitoral folds-- Nystatin cream (MYCOSTATIN); Apply 1 Application topically 2 (two) times daily for 14 days.  Dispense: 28 g; Refill: 0 - Cervicovaginal ancillary only( [place])-- vaginal - Recommended use of coconut oil or crisco BID for hydration and also with intercourse for lubrication  2. Amenorrhea - LMP ~6 months, likely has gone through menopause. Discussed getting FSH to confirm and also to help guide treatment modalities - Follicle stimulating hormone   Please refer to After Visit Summary for other counseling recommendations.   Return if symptoms worsen or fail to improve.  Federico Flake, MD, MPH, ABFM Attending Physician Faculty Practice- [facility] for [facility]

## 2022-09-11 LAB — CERVICOVAGINAL ANCILLARY ONLY
Bacterial Vaginitis (gardnerella): POSITIVE — AB
Candida Glabrata: NEGATIVE
Candida Glabrata: NEGATIVE
Candida Vaginitis: POSITIVE — AB
Candida Vaginitis: POSITIVE — AB
Chlamydia: NEGATIVE
Comment: NEGATIVE
Comment: NEGATIVE
Comment: NEGATIVE
Comment: NEGATIVE
Comment: NEGATIVE
Comment: NORMAL
Comment: NEGATIVE
Comment: NEGATIVE
Neisseria Gonorrhea: NEGATIVE
Trichomonas: NEGATIVE

## 2022-09-11 LAB — FOLLICLE STIMULATING HORMONE: FSH: 13.6 m[IU]/mL

## 2022-09-11 MED ORDER — METRONIDAZOLE 0.75 % VA GEL: 1.0000 | Freq: Every day | VAGINAL | 1 refills | Status: AC

## 2022-09-11 NOTE — Addendum Note (Signed)
Addended by: Geanie Berlin on: 09/11/2022 09:58 PM   Modules accepted: Orders

## 2022-09-13 ENCOUNTER — Telehealth: Payer: Self-pay | Admitting: *Deleted

## 2022-09-13 NOTE — Telephone Encounter (Signed)
I called [facility] and informed her results and recommendations from Dr. Alvester Morin. She states she had read MyChart message from Dr. Alvester Morin. She has already started Nystatin. I recommended she finish Nystatin , then start Metrogel and to let us know if yeast does not clear up after she finishes both as she may need additonal. She voice understanding. Nancy Fetter

## 2022-09-13 NOTE — Telephone Encounter (Signed)
-----   Message from Federico Flake, MD sent at 09/11/2022  9:58 PM EDT ----- Yeast on swab. Sent in nystatin as her visit. BV present, sent in metrogel [facility] consistent with pre-menopause

## 2022-10-03 ENCOUNTER — Ambulatory Visit: Payer: BC Managed Care – PPO | Admitting: Obstetrics & Gynecology

## 2022-10-19 IMAGING — DX DG HIP (WITH OR WITHOUT PELVIS) 2-3V*L*
3 series · 3 of 3 positions shown · non-contrast
Comparison: October 22, 2016

CLINICAL DATA: hip pain history of a LEFT hip lipoma removal on
10/23/2019

EXAM:
DG HIP (WITH OR WITHOUT PELVIS) 2-3V LEFT

[pelvis ap]
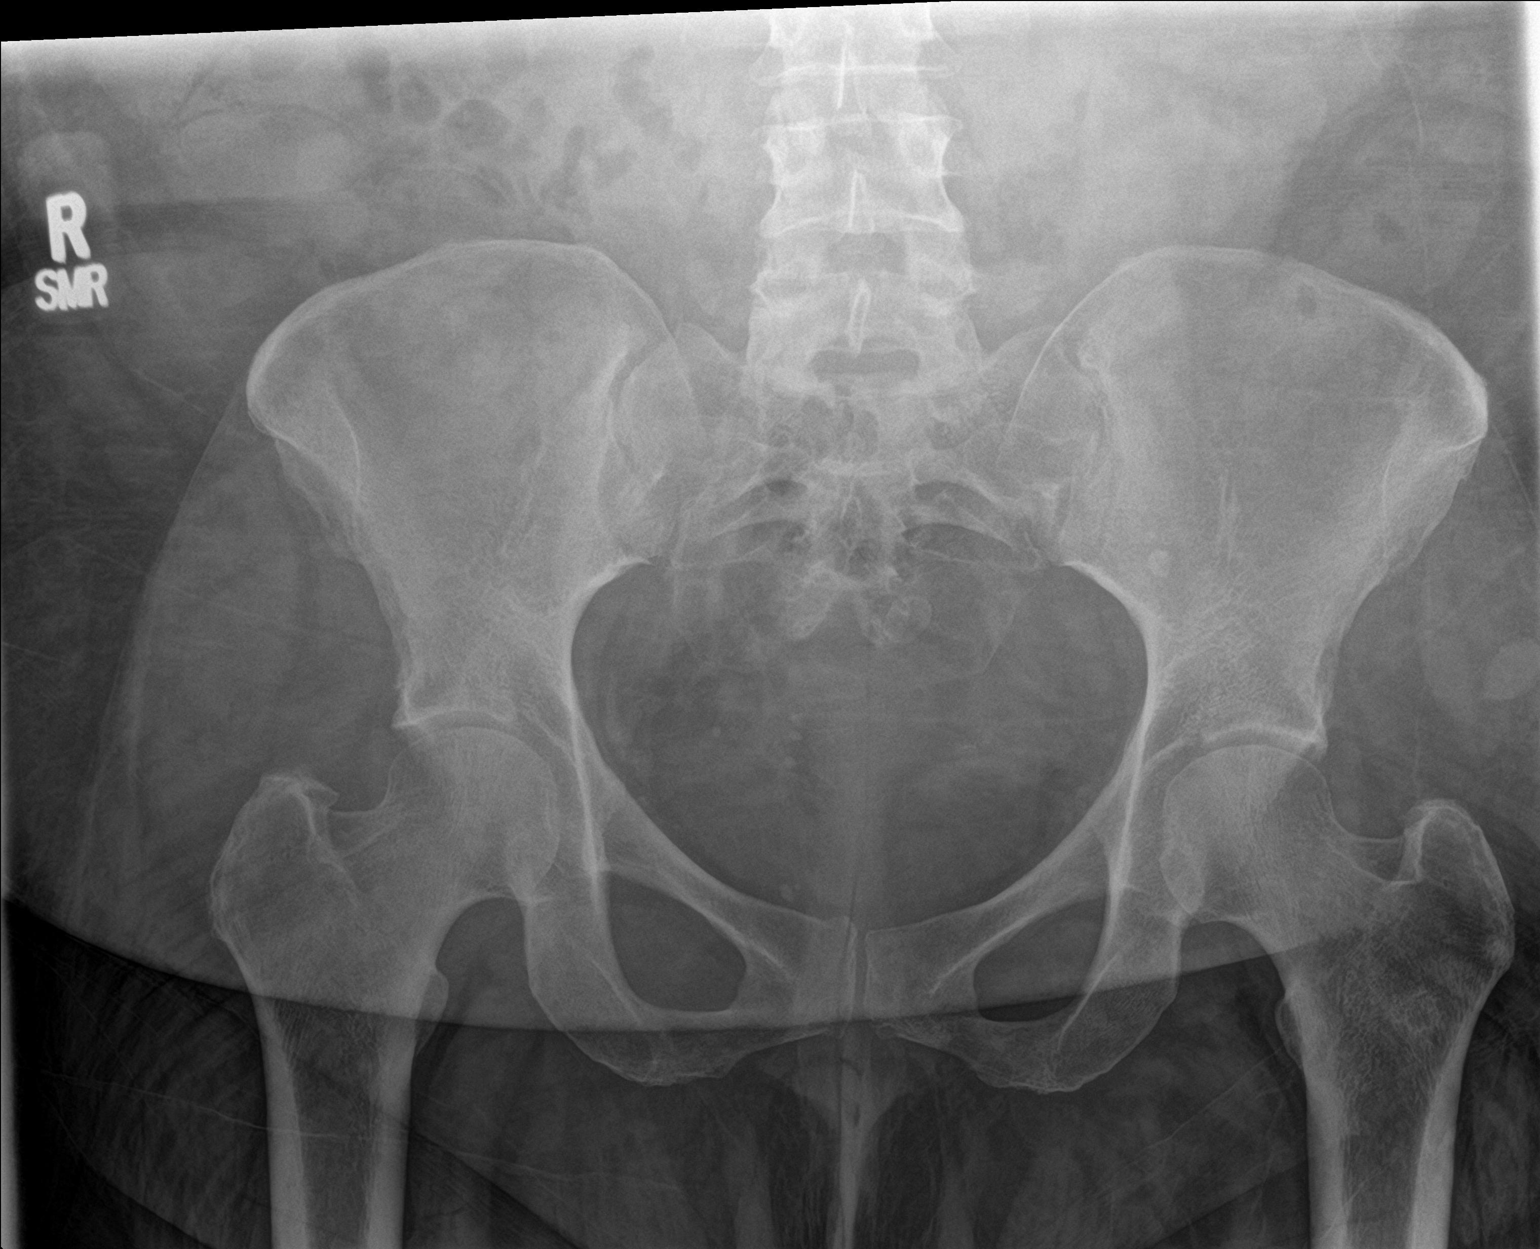

[hip ap]
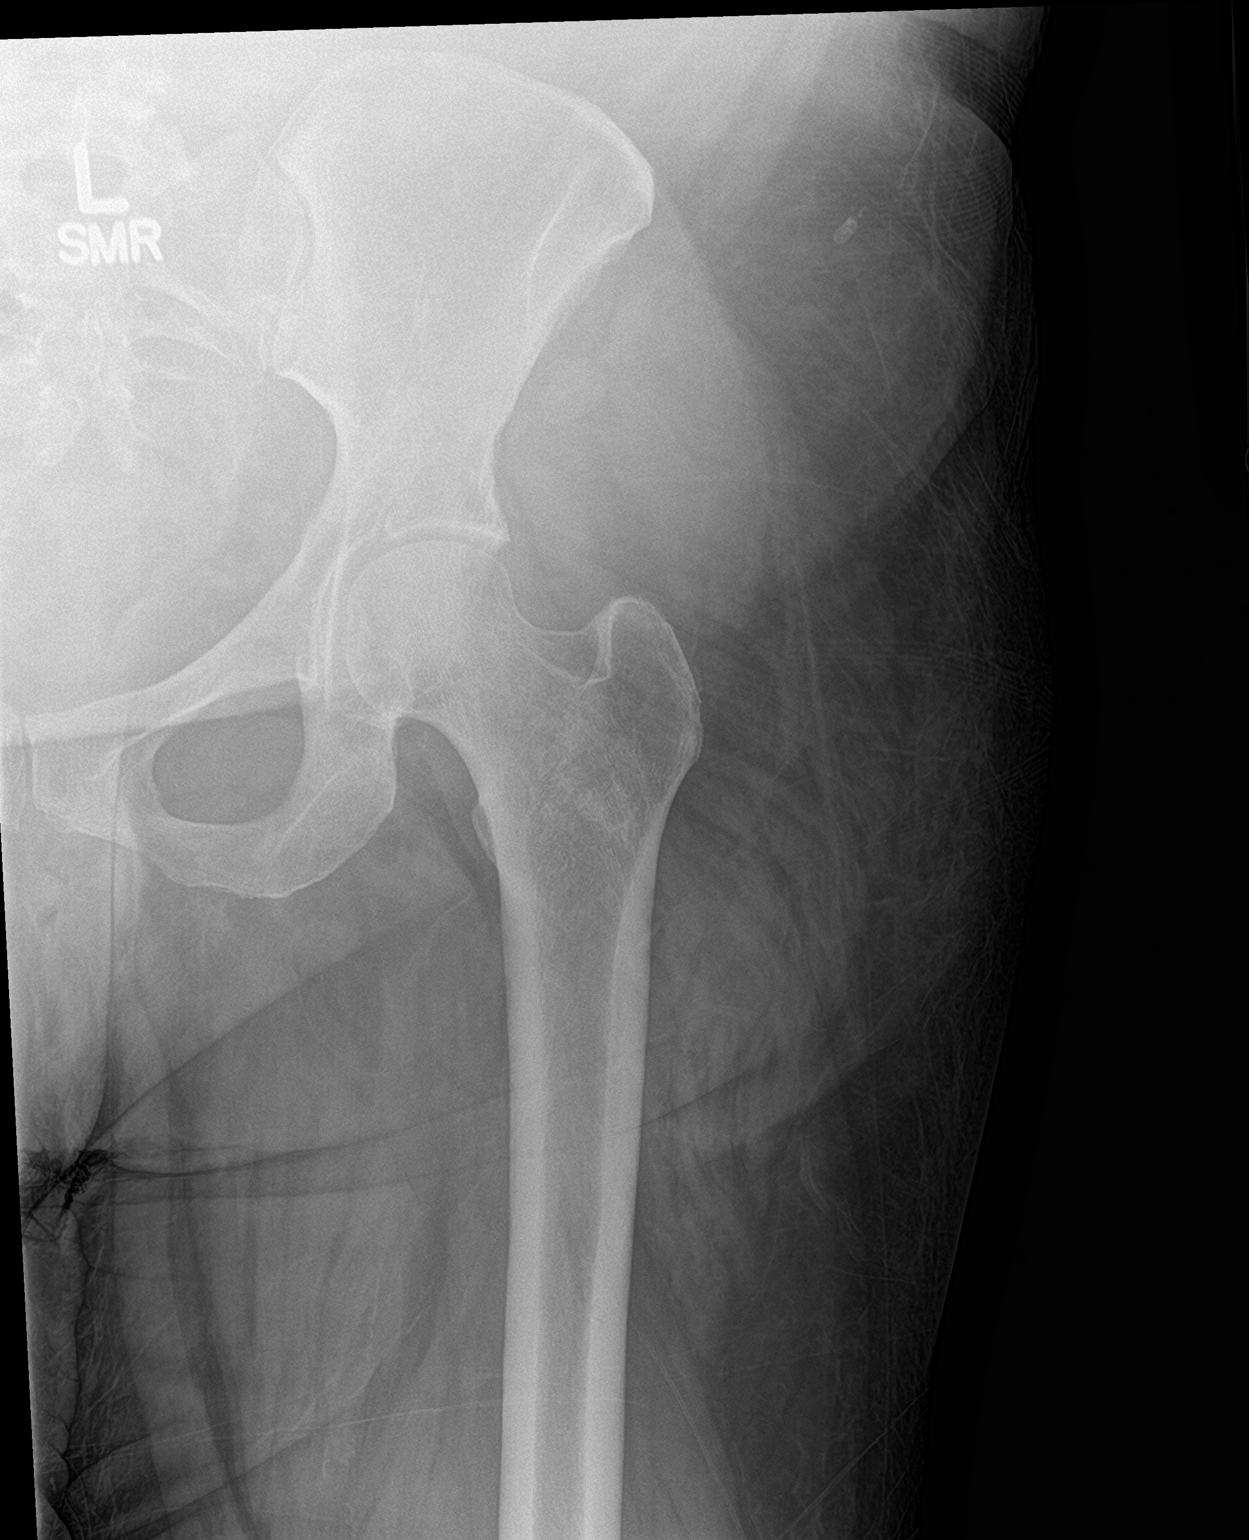

[hip lat]
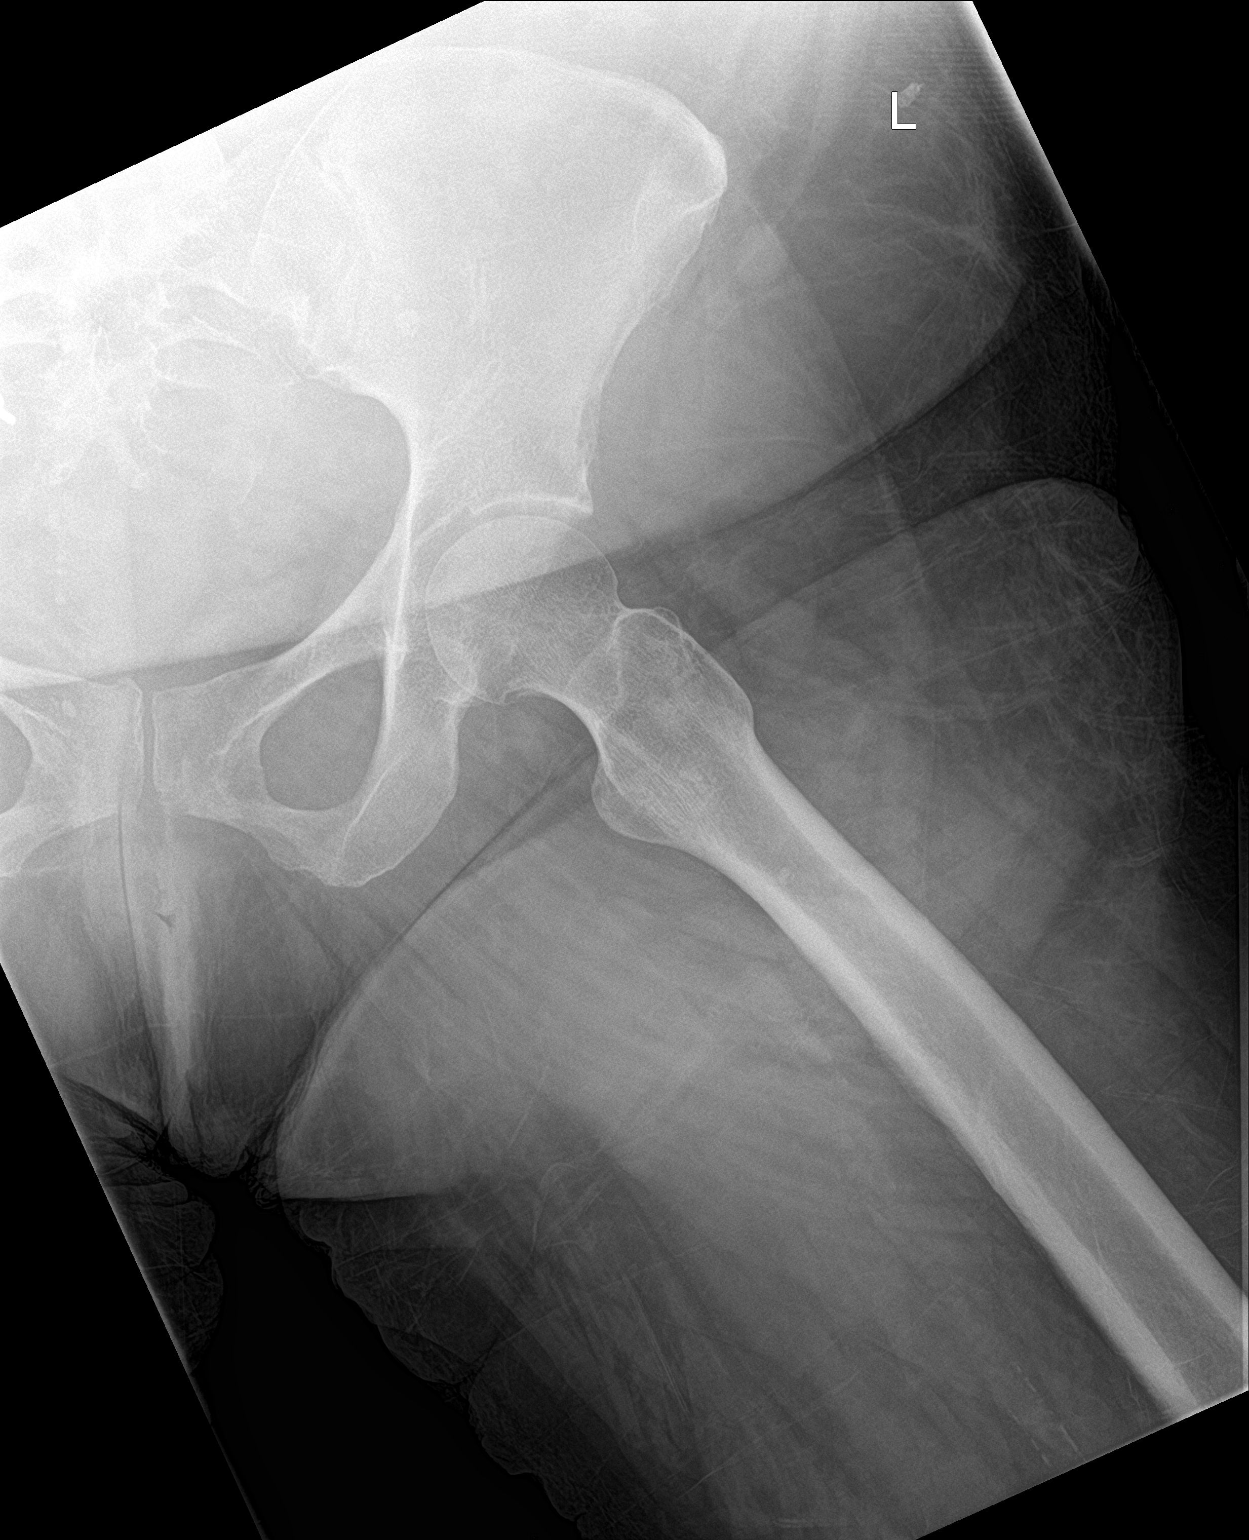

[3 of 3 positions shown; findings below may reference images not displayed]

FINDINGS: No acute fracture or dislocation. Joint spaces and alignment are
maintained. No area of erosion or osseous destruction. No unexpected
radiopaque foreign body. Scattered pelvic phleboliths. Vascular
calcifications.
IMPRESSION: No acute osseous abnormality.

## 2022-10-20 ENCOUNTER — Other Ambulatory Visit (HOSPITAL_COMMUNITY): Payer: Self-pay

## 2022-10-23 ENCOUNTER — Other Ambulatory Visit (HOSPITAL_COMMUNITY): Payer: Self-pay

## 2022-11-26 ENCOUNTER — Other Ambulatory Visit (HOSPITAL_COMMUNITY): Payer: Self-pay

## 2022-11-26 ENCOUNTER — Ambulatory Visit: Payer: BC Managed Care – PPO | Admitting: Student

## 2022-11-26 VITALS — BP 124/76 | HR 94 | Temp 98.6°F | Ht 66.0 in | Wt 279.0 lb

## 2022-11-26 DIAGNOSIS — R42 Dizziness and giddiness: Secondary | ICD-10-CM | POA: Diagnosis not present

## 2022-11-26 DIAGNOSIS — Z7984 Long term (current) use of oral hypoglycemic drugs: Secondary | ICD-10-CM

## 2022-11-26 DIAGNOSIS — E1142 Type 2 diabetes mellitus with diabetic polyneuropathy: Secondary | ICD-10-CM | POA: Diagnosis not present

## 2022-11-26 LAB — POCT GLYCOSYLATED HEMOGLOBIN (HGB A1C): Hemoglobin A1C: 8.2 % — AB (ref 4.0–5.6)

## 2022-11-26 LAB — GLUCOSE, CAPILLARY: Glucose-Capillary: 252 mg/dL — ABNORMAL HIGH (ref 70–99)

## 2022-11-26 MED ORDER — OZEMPIC (0.25 OR 0.5 MG/DOSE) 2 MG/3ML ~~LOC~~ SOPN
0.2500 mg | PEN_INJECTOR | SUBCUTANEOUS | 3 refills | Status: AC
Start: 2022-11-26 — End: ?
  Filled 2022-11-26: qty 3, 56d supply, fill #0

## 2022-11-26 NOTE — Patient Instructions (Addendum)
Thank you so much for coming to the clinic today!   I think your dizziness has to do with the amount of water you are in taking. Please try to drink around 4L daily. I will call you with the results of your blood work and what the next steps are. I am also sending in a medication called Ozempic, it is once a week, and start at .25mg  for the first four weeks. You can go up to .5mg  if you need to as well. It is important you stop taking the Rybelsus. If your symptoms don't resolve within the next couple weeks I would like to see you back in the clinic, if they do I will see you in three months.    If you have any questions please feel free to the call the clinic at anytime at [phone]. It was a pleasure seeing you!  Best, Dr. Thomasene Ripple

## 2022-11-26 NOTE — Assessment & Plan Note (Addendum)
A1c was 8.5 in January 2024, currently 8.2.  Regimen includes metformin 1000 mg twice a day, Rybelsus 14 mg, glipizide 5 mg.  I believe she would greatly benefit from Ozempic given obesity and concern for intermittent hypoglycemia.  Will start her at 25 mg, can uptitrate to 5 mg after 4 weeks of patient tolerating well.  Will stop Rybelsus.  Eventually, would like to stop glipizide as well, however will reevaluate at next visit.  Plan: - Stop Rybelsus - Start Ozempic 0.25 mg weekly

## 2022-11-26 NOTE — Assessment & Plan Note (Addendum)
Patient presents with intermittent dizziness episodes.  She states this started about 6 to 7 days ago, and states that randomly throughout the day she would feel his pressure starts to leak the back of her head and radiates towards the front of her head.  There are no exacerbating factors, and no relieving factors besides time.  They usually last around 5 to 10 minutes.  She denies any trauma to the head, headaches, falls, nausea, vomiting, hot flashes, fevers, chills, palpitations, bleeding, and change in bowel movements.  Of note, patient does have diabetes and she does take glipizide daily.  She also has a history of iron deficiency anemia that was secondary to heavy menstrual bleeding for which she is on iron supplementation for.  She does note that she drinks one 8 ounce bottle of water a day, nowhere near what her daily requirement should be.  Differentials currently include: Dehydration, intermittent hypoglycemia, and iron deficiency anemia.  Symptoms do not seem consistent with vertigo as unable to replicate symptoms with head tilt test.  Cardiac exam within normal limits, so less concern for cardiac etiology.  Neuroexam with no weakness and normal strength so less concern for neuro cause.  Patient denies depressive symptoms as well Plan:  -Will check CBC, iron panel, BMP for further evaluation - Encourage patient to check glucose levels during these episodes - Encourage patient to keep up with her fluid intake

## 2022-11-26 NOTE — Progress Notes (Addendum)
CC: Dizziness  HPI:  [name] is a 52 y.o. female living with a history stated below and presents today for dizziness. Please see problem based assessment and plan for additional details.  Past Medical History:  Diagnosis Date   Allergic rhinitis    Arthritis    hips   Diabetes mellitus    Dizziness 06/25/2017   Fatigue 04/15/2015   FRACTURE, CLAVICLE, RIGHT 11/22/2008   Qualifier: Diagnosis of  By: Elizebeth Koller MD, Caron Presume     GERD (gastroesophageal reflux disease)    Hip pain, bilateral    ? lipomas   HIP PAIN, BILATERAL 04/05/2006   Annotation: since 1999 Qualifier: Diagnosis of  By: Frederico Hamman MD, Annabelle Harman     Hypertension    Irregular menstruation    Left-sided back pain 04/15/2015   Lipoma of other skin and subcutaneous tissue 05/05/2008   Qualifier: Diagnosis of  By: Elizebeth Koller MD, Ruben     Lymphedema    chronic, since age 64   Mammogram abnormal    06, s/p biopsy of mass and neg work up for neoplasm   Neck pain on right side 06/25/2017   Need for prophylactic vaccination with combined diphtheria-tetanus-pertussis (DTP) vaccine 07/15/2012   Obesity    Polyarthropathy    inflammatory   Right ovarian cyst    s/p resection of r ovary 06   RLQ PAIN 03/15/2009   Qualifier: Diagnosis of  By: Comer Locket MD, Vijay     Sebaceous cyst 07/15/2012   Shoulder pain 07/26/2015   Vaginal itching 12/27/2016   Wears glasses     Current Outpatient Medications on File Prior to Visit  Medication Sig Dispense Refill   acetaminophen (TYLENOL) 500 MG tablet Take 2 tablets (1,000 mg total) by mouth every 8 (eight) hours as needed (pain). 30 tablet 0   ferrous sulfate 325 (65 FE) MG tablet Take 325 mg by mouth daily with breakfast.     gabapentin (NEURONTIN) 300 MG capsule Take 1 capsule (300 mg total) by mouth at bedtime as needed (neuropathy in feet). 30 capsule 5   glipiZIDE (GLUCOTROL) 5 MG tablet Take 1 tablet (5 mg total) by mouth daily before breakfast. 180 tablet 1   glucose  blood (CONTOUR NEXT TEST) test strip Use up to two times per day. 100 each 4   ibuprofen (ADVIL) 800 MG tablet Take 1 tablet (800 mg total) by mouth every 8 (eight) hours as needed. 12 tablet 0   itraconazole (SPORANOX) 100 MG capsule Take 2 capsules (200 mg total) by mouth daily for 5 days 10 capsule 0   Lancets MISC Use up to two times per day 100 each 4   lisinopril-hydrochlorothiazide (ZESTORETIC) 20-12.5 MG tablet Take 1 tablet by mouth daily. 30 tablet 11   metFORMIN (GLUCOPHAGE) 1000 MG tablet Take 1 tablet (1,000 mg total) by mouth 2 (two) times daily with a meal. 180 tablet 3   polyethylene glycol (MIRALAX / GLYCOLAX) 17 g packet Take 17 g by mouth 2 (two) times daily.     rosuvastatin (CRESTOR) 20 MG tablet Take 1 tablet (20 mg total) by mouth daily. 90 tablet 11   [DISCONTINUED] enalapril (VASOTEC) 20 MG tablet TAKE 1 TABLET BY MOUTH DAILY. 90 tablet 3   No current facility-administered medications on file prior to visit.    Family History  Problem Relation Age of Onset   Hypertension Mother    Cancer Mother        breast cancer   Anemia Mother  Sickle cell trait Sister    Cancer Maternal Uncle        breast cancer   Cancer Paternal Grandmother        lung cancer   Hypertension Paternal Grandmother    Diabetes Paternal Grandmother    Colon cancer Neg Hx    Colon polyps Neg Hx    Esophageal cancer Neg Hx    Rectal cancer Neg Hx    Stomach cancer Neg Hx     Social History   Socioeconomic History   Marital status: Married    Spouse name: Sharon Fisher   Number of children: 1   Years of education: 10   Highest education level: Not on file  Occupational History   Not on file  Tobacco Use   Smoking status: Former    Types: Cigarettes    Quit date: 1995    Years since quitting: 29.5   Smokeless tobacco: Never   Tobacco comments:    Quit 1995.  Vaping Use   Vaping Use: Never used  Substance and Sexual Activity   Alcohol use: No    Alcohol/week: 0.0 standard  drinks of alcohol   Drug use: No   Sexual activity: Yes  Other Topics Concern   Not on file  Social History Narrative   Works as a Engineer, structural.   Lives at home with husband and daughter.   Social Determinants of Health   Financial Resource Strain: Not on file  Food Insecurity: Food Insecurity Present (09/10/2022)   Hunger Vital Sign    Worried About Running Out of Food in the Last Year: Sometimes true    Ran Out of Food in the Last Year: Sometimes true  Transportation Needs: No Transportation Needs (09/10/2022)   PRAPARE - Administrator, Civil Service (Medical): No    Lack of Transportation (Non-Medical): No  Physical Activity: Not on file  Stress: Not on file  Social Connections: Moderately Integrated (06/14/2022)   Social Connection and Isolation Panel [NHANES]    Frequency of Communication with Friends and Family: More than three times a week    Frequency of Social Gatherings with Friends and Family: More than three times a week    Attends Religious Services: More than 4 times per year    Active Member of [employer] or Organizations: No    Attends Banker Meetings: Never    Marital Status: Married  Catering manager Violence: Not At Risk (06/14/2022)   Humiliation, Afraid, Rape, and Kick questionnaire    Fear of Current or Ex-Partner: No    Emotionally Abused: No    Physically Abused: No    Sexually Abused: No    Review of Systems: ROS negative except for what is noted on the assessment and plan.  Vitals:   11/26/22 1403 11/26/22 1411 11/26/22 1413 11/26/22 1416  BP: 122/70 129/76 134/77 124/76  Pulse: 92 93 95 94  Temp: 98.6 F (37 C)     TempSrc: Oral     SpO2: 100%     Weight: 279 lb (126.6 kg)     Height: 5\' 6"  (1.676 m)       Physical Exam: Constitutional: well-appearing female in no acute distress HENT: normocephalic atraumatic, mucous membranes moist Eyes: conjunctiva non-erythematous Neck: supple Cardiovascular: regular rate and rhythm,  no m/r/g Pulmonary/Chest: normal work of breathing on room air, lungs clear to auscultation bilaterally Abdominal: soft, non-tender, non-distended MSK: normal bulk and tone Neurological: alert & oriented x 3, 5/5 strength in bilateral  upper and lower extremities, normal gait Skin: warm and dry Psych: Normal mood and affect  Assessment & Plan:   Dizziness Patient presents with intermittent dizziness episodes.  She states this started about 6 to 7 days ago, and states that randomly throughout the day she would feel his pressure starts to leak the back of her head and radiates towards the front of her head.  There are no exacerbating factors, and no relieving factors besides time.  They usually last around 5 to 10 minutes.  She denies any trauma to the head, headaches, falls, nausea, vomiting, hot flashes, fevers, chills, palpitations, bleeding, and change in bowel movements.  Of note, patient does have diabetes and she does take glipizide daily.  She also has a history of iron deficiency anemia that was secondary to heavy menstrual bleeding for which she is on iron supplementation for.  She does note that she drinks one 8 ounce bottle of water a day, nowhere near what her daily requirement should be.  Differentials currently include: Dehydration, intermittent hypoglycemia, and iron deficiency anemia.  Symptoms do not seem consistent with vertigo as unable to replicate symptoms with head tilt test.  Cardiac exam within normal limits, so less concern for cardiac etiology.  Neuroexam with no weakness and normal strength so less concern for neuro cause.  Patient denies depressive symptoms as well Plan:  -Will check CBC, iron panel, BMP for further evaluation - Encourage patient to check glucose levels during these episodes - Encourage patient to keep up with her fluid intake  Type 2 diabetes mellitus with peripheral neuropathy (HCC) A1c was 8.5 in January 2024, currently 8.2.  Regimen includes  metformin 1000 mg twice a day, Rybelsus 14 mg, glipizide 5 mg.  I believe she would greatly benefit from Ozempic given obesity and concern for intermittent hypoglycemia.  Will start her at .25 mg, can uptitrate to .5 mg after 4 weeks of patient tolerating well.  Will stop Rybelsus.  Eventually, would like to stop glipizide as well, however will reevaluate at next visit.  Plan: - Stop Rybelsus - Start Ozempic 0.25 mg weekly  Patient discussed with Dr. Lennon Alstrom Sharon Fisher, M.D. [facility] Internal Medicine, PGY-1 Pager: [phone] Date 11/30/2022 Time 8:23 AM

## 2022-11-27 LAB — CBC
Hematocrit: 37.1 % (ref 34.0–46.6)
Hemoglobin: 12.5 g/dL (ref 11.1–15.9)
MCH: 31.6 pg (ref 26.6–33.0)
MCHC: 33.7 g/dL (ref 31.5–35.7)
MCV: 94 fL (ref 79–97)
Platelets: 248 10*3/uL (ref 150–450)
RBC: 3.95 x10E6/uL (ref 3.77–5.28)
RDW: 14.9 % (ref 11.7–15.4)
WBC: 6.7 10*3/uL (ref 3.4–10.8)

## 2022-11-27 LAB — IRON,TIBC AND FERRITIN PANEL
Ferritin: 74 ng/mL (ref 15–150)
Iron Saturation: 24 % (ref 15–55)
Iron: 77 ug/dL (ref 27–159)
Total Iron Binding Capacity: 321 ug/dL (ref 250–450)
UIBC: 244 ug/dL (ref 131–425)

## 2022-11-27 LAB — BMP8+ANION GAP
Anion Gap: 13 mmol/L (ref 10.0–18.0)
BUN/Creatinine Ratio: 12 (ref 9–23)
BUN: 8 mg/dL (ref 6–24)
CO2: 23 mmol/L (ref 20–29)
Calcium: 9.6 mg/dL (ref 8.7–10.2)
Chloride: 102 mmol/L (ref 96–106)
Creatinine, Ser: 0.66 mg/dL (ref 0.57–1.00)
Glucose: 248 mg/dL — ABNORMAL HIGH (ref 70–99)
Potassium: 4.1 mmol/L (ref 3.5–5.2)
Sodium: 138 mmol/L (ref 134–144)
eGFR: 105 mL/min/{1.73_m2} (ref >59)

## 2022-11-30 NOTE — Progress Notes (Signed)
Internal Medicine Clinic Attending  Case discussed with Dr. Thomasene Ripple  At the time of the visit.  We reviewed the resident's history and exam and pertinent patient test results.  I agree with the assessment, diagnosis, and plan of care documented in the resident's note.   I have discussed starting Ms. Finklea at [facility] 0.5 mg weekly, rather than 0.25 mg weekly as noted, with Dr. Thomasene Ripple given equivalency to Rybelsus 14 mg daily. He will reach out to Ms. Zengel to discuss.

## 2022-12-17 ENCOUNTER — Other Ambulatory Visit (HOSPITAL_COMMUNITY): Payer: Self-pay

## 2023-01-02 ENCOUNTER — Other Ambulatory Visit (HOSPITAL_COMMUNITY): Payer: Self-pay

## 2023-01-02 DIAGNOSIS — E1165 Type 2 diabetes mellitus with hyperglycemia: Secondary | ICD-10-CM | POA: Diagnosis not present

## 2023-01-02 DIAGNOSIS — R42 Dizziness and giddiness: Secondary | ICD-10-CM | POA: Diagnosis not present

## 2023-01-02 DIAGNOSIS — E785 Hyperlipidemia, unspecified: Secondary | ICD-10-CM | POA: Diagnosis not present

## 2023-01-02 DIAGNOSIS — I10 Essential (primary) hypertension: Secondary | ICD-10-CM | POA: Diagnosis not present

## 2023-01-02 MED ORDER — METFORMIN HCL 1000 MG PO TABS
1000.0000 mg | ORAL_TABLET | Freq: Two times a day (BID) | ORAL | 3 refills | Status: AC
  Filled 2023-01-02 (×2): qty 180, 90d supply, fill #0

## 2023-01-02 MED ORDER — GLIPIZIDE 5 MG PO TABS
5.0000 mg | ORAL_TABLET | Freq: Every day | ORAL | 3 refills | Status: AC
  Filled 2023-01-02: qty 90, 90d supply, fill #0
  Filled 2023-03-27 (×2): qty 90, 90d supply, fill #1
  Filled 2023-07-13: qty 90, 90d supply, fill #2

## 2023-01-02 MED ORDER — OZEMPIC (0.25 OR 0.5 MG/DOSE) 2 MG/3ML ~~LOC~~ SOPN
0.5000 mg | PEN_INJECTOR | SUBCUTANEOUS | 2 refills | Status: AC
Start: 2023-01-02 — End: ?
  Filled 2023-01-02: qty 3, 28d supply, fill #0

## 2023-01-02 MED ORDER — ROSUVASTATIN CALCIUM 20 MG PO TABS
20.0000 mg | ORAL_TABLET | Freq: Every day | ORAL | 3 refills | Status: AC
  Filled 2023-01-02: qty 90, 90d supply, fill #0

## 2023-01-02 MED FILL — Lisinopril & Hydrochlorothiazide Tab 20-12.5 MG: 1.0000 | ORAL | 90 days supply | Qty: 90 | Fill #0 | Status: AC

## 2023-01-03 ENCOUNTER — Other Ambulatory Visit: Payer: Self-pay | Admitting: Physician Assistant

## 2023-01-03 DIAGNOSIS — Z1231 Encounter for screening mammogram for malignant neoplasm of breast: Secondary | ICD-10-CM

## 2023-01-11 ENCOUNTER — Ambulatory Visit: Payer: BC Managed Care – PPO

## 2023-01-23 ENCOUNTER — Other Ambulatory Visit (HOSPITAL_COMMUNITY): Payer: Self-pay

## 2023-01-23 DIAGNOSIS — I10 Essential (primary) hypertension: Secondary | ICD-10-CM | POA: Diagnosis not present

## 2023-01-23 DIAGNOSIS — R42 Dizziness and giddiness: Secondary | ICD-10-CM | POA: Diagnosis not present

## 2023-01-23 DIAGNOSIS — E1165 Type 2 diabetes mellitus with hyperglycemia: Secondary | ICD-10-CM | POA: Diagnosis not present

## 2023-01-23 MED ORDER — MECLIZINE HCL 25 MG PO TABS
25.0000 mg | ORAL_TABLET | Freq: Three times a day (TID) | ORAL | 3 refills | Status: AC | PRN
  Filled 2023-01-23: qty 90, 30d supply, fill #0

## 2023-01-23 MED ORDER — OZEMPIC (0.25 OR 0.5 MG/DOSE) 2 MG/3ML ~~LOC~~ SOPN
0.5000 mg | PEN_INJECTOR | SUBCUTANEOUS | 2 refills | Status: AC
  Filled 2023-01-23: qty 3, 28d supply, fill #0

## 2023-03-01 ENCOUNTER — Other Ambulatory Visit: Payer: Self-pay

## 2023-03-01 ENCOUNTER — Emergency Department (HOSPITAL_COMMUNITY): Payer: BC Managed Care – PPO

## 2023-03-01 ENCOUNTER — Emergency Department (HOSPITAL_COMMUNITY)
Admission: EM | Admit: 2023-03-01 | Discharge: 2023-03-02 | Disposition: A | Discharge status: Home / Self Care | Payer: BC Managed Care – PPO | Attending: Emergency Medicine | Admitting: Emergency Medicine

## 2023-03-01 ENCOUNTER — Encounter (HOSPITAL_COMMUNITY): Payer: Self-pay | Admitting: *Deleted

## 2023-03-01 DIAGNOSIS — I10 Essential (primary) hypertension: Secondary | ICD-10-CM | POA: Insufficient documentation

## 2023-03-01 DIAGNOSIS — R000 Tachycardia, unspecified: Secondary | ICD-10-CM | POA: Diagnosis not present

## 2023-03-01 DIAGNOSIS — R002 Palpitations: Secondary | ICD-10-CM | POA: Insufficient documentation

## 2023-03-01 DIAGNOSIS — E119 Type 2 diabetes mellitus without complications: Secondary | ICD-10-CM | POA: Diagnosis not present

## 2023-03-01 LAB — BASIC METABOLIC PANEL
Anion gap: 18 — ABNORMAL HIGH (ref 5–15)
BUN: 7 mg/dL (ref 6–20)
CO2: 23 mmol/L (ref 22–32)
Calcium: 9.3 mg/dL (ref 8.9–10.3)
Chloride: 99 mmol/L (ref 98–111)
Creatinine, Ser: 0.73 mg/dL (ref 0.44–1.00)
GFR, Estimated: >60 mL/min (ref >60)
Glucose, Bld: 265 mg/dL — ABNORMAL HIGH (ref 70–99)
Potassium: 4 mmol/L (ref 3.5–5.1)
Sodium: 140 mmol/L (ref 135–145)

## 2023-03-01 LAB — CBC
HCT: 38.5 % (ref 36.0–46.0)
Hemoglobin: 13 g/dL (ref 12.0–15.0)
MCH: 31.4 pg (ref 26.0–34.0)
MCHC: 33.8 g/dL (ref 30.0–36.0)
MCV: 93 fL (ref 80.0–100.0)
Platelets: 261 10*3/uL (ref 150–400)
RBC: 4.14 MIL/uL (ref 3.87–5.11)
RDW: 14.6 % (ref 11.5–15.5)
WBC: 7.1 10*3/uL (ref 4.0–10.5)
nRBC: 0.3 % — ABNORMAL HIGH (ref 0.0–0.2)

## 2023-03-01 LAB — HCG, SERUM, QUALITATIVE: Preg, Serum: NEGATIVE

## 2023-03-01 LAB — TROPONIN I (HIGH SENSITIVITY): Troponin I (High Sensitivity): 7 ng/L (ref <18)

## 2023-03-01 NOTE — ED Triage Notes (Signed)
The pt is c/o a rapid irregular pulse for 3 weeks  no pain anywhere

## 2023-03-01 NOTE — ED Provider Notes (Signed)
[facility] Emergency Department Provider Note MRN:  [id number]  Arrival date & time: 03/02/23     Chief Complaint   Irregular Heart Beat   History of Present Illness   Sharon Fisher is a 52 y.o. year-old female with a history of diabetes presenting to the ED with chief complaint of irregular heartbeat.  Heart feeling like it is beating fast and more forceful than normal.  These symptoms for the past 2 or 3 weeks.  Denies chest discomfort, no shortness of breath, no abdominal pain, no fever or cough, no leg pain or swelling.  Started Ozempic a few weeks ago, wondering if this is contributing.  Does not really drink a lot of water.  Review of Systems  A thorough review of systems was obtained and all systems are negative except as noted in the HPI and PMH.   Patient's Health History    Past Medical History:  Diagnosis Date   Allergic rhinitis    Arthritis    hips   Diabetes mellitus    Dizziness 06/25/2017   Fatigue 04/15/2015   FRACTURE, CLAVICLE, RIGHT 11/22/2008   Qualifier: Diagnosis of  By: Elizebeth Koller MD, Caron Presume     GERD (gastroesophageal reflux disease)    Hip pain, bilateral    ? lipomas   HIP PAIN, BILATERAL 04/05/2006   Annotation: since 1999 Qualifier: Diagnosis of  By: Frederico Hamman MD, Annabelle Harman     Hypertension    Irregular menstruation    Left-sided back pain 04/15/2015   Lipoma of other skin and subcutaneous tissue 05/05/2008   Qualifier: Diagnosis of  By: Elizebeth Koller MD, Ruben     Lymphedema    chronic, since age 48   Mammogram abnormal    06, s/p biopsy of mass and neg work up for neoplasm   Neck pain on right side 06/25/2017   Need for prophylactic vaccination with combined diphtheria-tetanus-pertussis (DTP) vaccine 07/15/2012   Obesity    Polyarthropathy    inflammatory   Right ovarian cyst    s/p resection of r ovary 06   RLQ PAIN 03/15/2009   Qualifier: Diagnosis of  By: Comer Locket MD, Vijay     Sebaceous cyst 07/15/2012   Shoulder pain  07/26/2015   Vaginal itching 12/27/2016   Wears glasses     Past Surgical History:  Procedure Laterality Date   BREAST BIOPSY Left    BREAST REDUCTION SURGERY  1996   CYST EXCISION Left 09/28/2021   Procedure: EXCISION CHRONIC POSTERIOR NECK ABSCESS;  Surgeon: Abigail Miyamoto, MD;  Location: [facility] OR;  Service: General;  Laterality: Left;   DILATION AND CURETTAGE OF UTERUS     MASS EXCISION N/A 02/27/2013   Procedure: CYST REMOVAL NECK;  Surgeon: Shelly Rubenstein, MD;  Location: [facility];  Service: General;  Laterality: N/A;  Excision chronic posterior neck cyst   MASS EXCISION Left 10/05/2019   Procedure: EXCISION POSTERIOR NECK SEBACEOUS CYST AND LEFT HIP MASS;  Surgeon: Abigail Miyamoto, MD;  Location:  SURGERY CENTER;  Service: General;  Laterality: Left;  Hip & neck sites   MYOMECTOMY ABDOMINAL APPROACH     REDUCTION MAMMAPLASTY Bilateral 1996   surgery of fibroids     but still has fibroids   TONSILLECTOMY AND ADENOIDECTOMY      Family History  Problem Relation Age of Onset   Hypertension Mother    Cancer Mother        breast cancer   Anemia Mother    Sickle cell  trait Sister    Cancer Maternal Uncle        breast cancer   Cancer Paternal Grandmother        lung cancer   Hypertension Paternal Grandmother    Diabetes Paternal Grandmother    Colon cancer Neg Hx    Colon polyps Neg Hx    Esophageal cancer Neg Hx    Rectal cancer Neg Hx    Stomach cancer Neg Hx     Social History   Socioeconomic History   Marital status: Married    Spouse name: Mirta Mally   Number of children: 1   Years of education: 10   Highest education level: Not on file  Occupational History   Not on file  Tobacco Use   Smoking status: Former    Current packs/day: 0.00    Types: Cigarettes    Quit date: 1995    Years since quitting: 29.7   Smokeless tobacco: Never   Tobacco comments:    Quit 1995.  Vaping Use   Vaping status: Never Used  Substance and  Sexual Activity   Alcohol use: No    Alcohol/week: 0.0 standard drinks of alcohol   Drug use: No   Sexual activity: Yes  Other Topics Concern   Not on file  Social History Narrative   Works as a Engineer, structural.   Lives at home with husband and daughter.   Social Determinants of Health   Financial Resource Strain: Not on file  Food Insecurity: Food Insecurity Present (09/10/2022)   Hunger Vital Sign    Worried About Running Out of Food in the Last Year: Sometimes true    Ran Out of Food in the Last Year: Sometimes true  Transportation Needs: No Transportation Needs (09/10/2022)   PRAPARE - Administrator, Civil Service (Medical): No    Lack of Transportation (Non-Medical): No  Physical Activity: Not on file  Stress: Not on file  Social Connections: Moderately Integrated (06/14/2022)   Social Connection and Isolation Panel [NHANES]    Frequency of Communication with Friends and Family: More than three times a week    Frequency of Social Gatherings with Friends and Family: More than three times a week    Attends Religious Services: More than 4 times per year    Active Member of [employer] or Organizations: No    Attends Banker Meetings: Never    Marital Status: Married  Catering manager Violence: Not At Risk (06/14/2022)   Humiliation, Afraid, Rape, and Kick questionnaire    Fear of Current or Ex-Partner: No    Emotionally Abused: No    Physically Abused: No    Sexually Abused: No     Physical Exam   Vitals:   03/01/23 2215 03/02/23 0119  BP: 128/82 133/78  Pulse: 81 83  Resp: 18 19  Temp: 98.3 F (36.8 C)   SpO2: 100% 97%    CONSTITUTIONAL: Well-appearing, NAD NEURO/PSYCH:  Alert and oriented x 3, no focal deficits EYES:  eyes equal and reactive ENT/NECK:  no LAD, no JVD CARDIO: Regular rate, well-perfused, normal S1 and S2 PULM:  CTAB no wheezing or rhonchi GI/GU:  non-distended, non-tender MSK/SPINE:  No gross deformities, no edema SKIN:  no rash,  atraumatic   *Additional and/or pertinent findings included in MDM below  Diagnostic and Interventional Summary    EKG Interpretation Date/Time:  Friday March 01 2023 18:17:35 EDT Ventricular Rate:  94 PR Interval:  188 QRS Duration:  74 QT Interval:  362 QTC Calculation: 452 R Axis:   -28  Text Interpretation: Sinus rhythm with occasional Premature ventricular complexes Otherwise normal ECG When compared with ECG of 20-Sep-2021 15:30, PREVIOUS ECG IS PRESENT Confirmed by Kennis Carina [id number]) on 03/01/2023 11:04:22 PM       Labs Reviewed  BASIC METABOLIC PANEL - Abnormal; Notable for the following components:      Result Value   Glucose, Bld 265 (*)    Anion gap 18 (*)    All other components within normal limits  CBC - Abnormal; Notable for the following components:   nRBC 0.3 (*)    All other components within normal limits  HCG, SERUM, QUALITATIVE  TSH  MAGNESIUM  TROPONIN I (HIGH SENSITIVITY)  TROPONIN I (HIGH SENSITIVITY)    DG Chest 2 View  Final Result      Medications - No data to display   Procedures  /  Critical Care .1-3 Lead EKG Interpretation  Performed by: Sabas Sous, MD Authorized by: Sabas Sous, MD     Interpretation: normal     ECG rate:  80s   ECG rate assessment: normal     Rhythm: sinus rhythm     Ectopy: PVCs     Conduction: normal   Comments:     Cardiac monitoring was ordered to monitor the patient for dysrhythmia.  I personally interpreted the patient's cardiac monitor while at the bedside.     ED Course and Medical Decision Making  Initial Impression and Ddx Palpitations, possibly related to dehydration.  Other considerations include hyperthyroidism, arrhythmia.  Electrolyte disturbance considered.  PVC on patient's EKG and so could be symptomatic PVCs.  Awaiting labs, will place on cardiac monitoring.  Past medical/surgical history that increases complexity of ED encounter: None  Interpretation of Diagnostics I  personally reviewed the EKG and my interpretation is as follows: Sinus rhythm with PVC  Labs reassuring with no significant blood count or electrolyte disturbance.  Troponin negative, TSH normal.  Patient Reassessment and Ultimate Disposition/Management     No arrhythmia during cardiac monitoring, continues to look and feel well with normal vital signs.  Appropriate for discharge.  Patient management required discussion with the following services or consulting groups:  None  Complexity of Problems Addressed Acute illness or injury that poses threat of life of bodily function  Additional Data Reviewed and Analyzed Further history obtained from: Further history from spouse/family member  Additional Factors Impacting ED Encounter Risk None  Elmer Sow. Pilar Plate, MD [facility] Emergency Medicine [facility] [email or web address]  Final Clinical Impressions(s) / ED Diagnoses     ICD-10-CM   1. Palpitations  R00.2       ED Discharge Orders     None        Discharge Instructions Discussed with and Provided to Patient:     Discharge Instructions      You were evaluated in the Emergency Department and after careful evaluation, we did not find any emergent condition requiring admission or further testing in the hospital.  Your exam/testing today is overall reassuring.  Recommend drinking more water over the next few days to see if this helps your symptoms.  Recommend follow-up with your primary care doctor.  Please return to the Emergency Department if you experience any worsening of your condition.   Thank you for allowing [place] to be a part of your care.       Sabas Sous, MD 03/02/23 [id number]

## 2023-03-02 LAB — TSH: TSH: 2.329 u[IU]/mL (ref 0.350–4.500)

## 2023-03-02 LAB — MAGNESIUM: Magnesium: 1.8 mg/dL (ref 1.7–2.4)

## 2023-03-02 LAB — TROPONIN I (HIGH SENSITIVITY): Troponin I (High Sensitivity): 3 ng/L (ref <18)

## 2023-03-02 NOTE — Discharge Instructions (Signed)
You were evaluated in the Emergency Department and after careful evaluation, we did not find any emergent condition requiring admission or further testing in the hospital.  Your exam/testing today is overall reassuring.  Recommend drinking more water over the next few days to see if this helps your symptoms.  Recommend follow-up with your primary care doctor.  Please return to the Emergency Department if you experience any worsening of your condition.   Thank you for allowing [place] to be a part of your care.

## 2023-03-11 ENCOUNTER — Other Ambulatory Visit (HOSPITAL_COMMUNITY): Payer: Self-pay

## 2023-03-11 DIAGNOSIS — I10 Essential (primary) hypertension: Secondary | ICD-10-CM | POA: Diagnosis not present

## 2023-03-11 DIAGNOSIS — R42 Dizziness and giddiness: Secondary | ICD-10-CM | POA: Diagnosis not present

## 2023-03-11 DIAGNOSIS — E785 Hyperlipidemia, unspecified: Secondary | ICD-10-CM | POA: Diagnosis not present

## 2023-03-11 DIAGNOSIS — E1165 Type 2 diabetes mellitus with hyperglycemia: Secondary | ICD-10-CM | POA: Diagnosis not present

## 2023-03-11 MED ORDER — ROSUVASTATIN CALCIUM 20 MG PO TABS
20.0000 mg | ORAL_TABLET | Freq: Every day | ORAL | 3 refills | Status: AC
  Filled 2023-03-27 (×2): qty 90, 90d supply, fill #0
  Filled 2023-07-13: qty 90, 90d supply, fill #1

## 2023-03-11 MED ORDER — LISINOPRIL-HYDROCHLOROTHIAZIDE 20-12.5 MG PO TABS
1.0000 | ORAL_TABLET | Freq: Every day | ORAL | 3 refills | Status: AC
  Filled 2023-03-27 (×2): qty 90, 90d supply, fill #0
  Filled 2023-07-13: qty 90, 90d supply, fill #1

## 2023-03-11 MED ORDER — METFORMIN HCL 1000 MG PO TABS
1000.0000 mg | ORAL_TABLET | Freq: Two times a day (BID) | ORAL | 3 refills | Status: AC
  Filled 2023-03-27 (×2): qty 180, 90d supply, fill #0
  Filled 2023-09-11 – 2023-09-25 (×2): qty 180, 90d supply, fill #1

## 2023-03-11 MED ORDER — PANTOPRAZOLE SODIUM 20 MG PO TBEC
20.0000 mg | DELAYED_RELEASE_TABLET | Freq: Every day | ORAL | 3 refills | Status: AC
  Filled 2023-03-11: qty 90, 90d supply, fill #0

## 2023-03-11 MED ORDER — OZEMPIC (0.25 OR 0.5 MG/DOSE) 2 MG/3ML ~~LOC~~ SOPN
0.5000 mg | PEN_INJECTOR | SUBCUTANEOUS | 2 refills | Status: AC
  Filled 2023-03-11: qty 3, 28d supply, fill #0

## 2023-03-12 ENCOUNTER — Other Ambulatory Visit (HOSPITAL_COMMUNITY): Payer: Self-pay

## 2023-03-27 ENCOUNTER — Other Ambulatory Visit (HOSPITAL_COMMUNITY): Payer: Self-pay

## 2023-03-27 ENCOUNTER — Other Ambulatory Visit: Payer: Self-pay | Admitting: Internal Medicine

## 2023-03-27 DIAGNOSIS — E1142 Type 2 diabetes mellitus with diabetic polyneuropathy: Secondary | ICD-10-CM

## 2023-03-27 MED ORDER — GABAPENTIN 300 MG PO CAPS
300.0000 mg | ORAL_CAPSULE | Freq: Every evening | ORAL | 3 refills | Status: AC | PRN
Start: 2023-03-27 — End: ?
  Filled 2023-03-27: qty 90, 90d supply, fill #0

## 2023-04-08 ENCOUNTER — Telehealth: Payer: Self-pay | Admitting: *Deleted

## 2023-04-08 ENCOUNTER — Other Ambulatory Visit (HOSPITAL_COMMUNITY): Payer: Self-pay

## 2023-04-08 NOTE — Telephone Encounter (Signed)
Transition Care Management Unsuccessful Follow-up Telephone Call  Date of discharge and from where:  [facility]/28/2024  Attempts:  1st Attempt  Reason for unsuccessful TCM follow-up call:  No answer/busy

## 2023-04-09 ENCOUNTER — Other Ambulatory Visit (HOSPITAL_COMMUNITY): Payer: Self-pay

## 2023-04-09 ENCOUNTER — Telehealth: Payer: Self-pay | Admitting: *Deleted

## 2023-04-09 MED ORDER — OZEMPIC (0.25 OR 0.5 MG/DOSE) 2 MG/3ML ~~LOC~~ SOPN
0.5000 mg | PEN_INJECTOR | SUBCUTANEOUS | 5 refills | Status: DC
  Filled 2023-04-09: qty 3, 28d supply, fill #0
  Filled 2023-05-13 – 2023-05-14 (×2): qty 3, 28d supply, fill #1

## 2023-04-09 NOTE — Telephone Encounter (Signed)
Transition Care Management Unsuccessful Follow-up Telephone Call  Date of discharge and from where:  [facility]  03/02/2023  Attempts: 2nd attempt   Reason for unsuccessful TCM follow-up call:  No answer/busy

## 2023-04-11 ENCOUNTER — Other Ambulatory Visit (HOSPITAL_COMMUNITY): Payer: Self-pay

## 2023-05-13 ENCOUNTER — Other Ambulatory Visit (HOSPITAL_BASED_OUTPATIENT_CLINIC_OR_DEPARTMENT_OTHER): Payer: Self-pay

## 2023-05-14 ENCOUNTER — Other Ambulatory Visit (HOSPITAL_COMMUNITY): Payer: Self-pay

## 2023-05-14 ENCOUNTER — Other Ambulatory Visit (HOSPITAL_BASED_OUTPATIENT_CLINIC_OR_DEPARTMENT_OTHER): Payer: Self-pay

## 2023-05-17 ENCOUNTER — Other Ambulatory Visit (HOSPITAL_COMMUNITY): Payer: Self-pay

## 2023-06-11 ENCOUNTER — Other Ambulatory Visit (HOSPITAL_COMMUNITY): Payer: Self-pay

## 2023-06-11 MED ORDER — MECLIZINE HCL 25 MG PO TABS
25.0000 mg | ORAL_TABLET | Freq: Three times a day (TID) | ORAL | 3 refills | Status: AC | PRN
  Filled 2023-06-11 – 2023-06-21 (×2): qty 90, 30d supply, fill #0

## 2023-06-11 MED ORDER — OZEMPIC (2 MG/DOSE) 8 MG/3ML ~~LOC~~ SOPN
2.0000 mg | PEN_INJECTOR | SUBCUTANEOUS | 2 refills | Status: DC
Start: 2023-06-11 — End: 2023-06-18
  Filled 2023-06-11: qty 3, 28d supply, fill #0

## 2023-06-11 MED ORDER — PANTOPRAZOLE SODIUM 20 MG PO TBEC
20.0000 mg | DELAYED_RELEASE_TABLET | Freq: Every day | ORAL | 3 refills | Status: AC
Start: 2023-06-11 — End: ?
  Filled 2023-06-11 – 2023-06-21 (×2): qty 90, 90d supply, fill #0
  Filled 2023-09-25: qty 90, 90d supply, fill #1

## 2023-06-12 ENCOUNTER — Other Ambulatory Visit (HOSPITAL_COMMUNITY): Payer: Self-pay

## 2023-06-18 ENCOUNTER — Other Ambulatory Visit (HOSPITAL_COMMUNITY): Payer: Self-pay

## 2023-06-18 MED ORDER — OZEMPIC (1 MG/DOSE) 4 MG/3ML ~~LOC~~ SOPN
1.0000 mg | PEN_INJECTOR | SUBCUTANEOUS | 2 refills | Status: DC
  Filled 2023-06-18: qty 3, 28d supply, fill #0
  Filled 2023-07-19: qty 3, 28d supply, fill #1
  Filled 2023-08-29: qty 3, 28d supply, fill #2

## 2023-06-21 ENCOUNTER — Other Ambulatory Visit (HOSPITAL_COMMUNITY): Payer: Self-pay

## 2023-06-24 ENCOUNTER — Encounter (HOSPITAL_COMMUNITY): Payer: Self-pay

## 2023-06-24 ENCOUNTER — Other Ambulatory Visit (HOSPITAL_COMMUNITY): Payer: Self-pay

## 2023-07-19 ENCOUNTER — Other Ambulatory Visit (HOSPITAL_COMMUNITY): Payer: Self-pay

## 2023-07-25 ENCOUNTER — Telehealth: Payer: Self-pay | Admitting: *Deleted

## 2023-07-25 NOTE — Telephone Encounter (Signed)
 Patient was identified as falling into the [place] Measure - Diabetes.   Patient was: Appointment scheduled with primary care provider in the next 30 days.

## 2023-08-14 ENCOUNTER — Encounter: Payer: BC Managed Care – PPO | Admitting: Internal Medicine

## 2023-08-14 NOTE — Progress Notes (Deleted)
 Subjective:  CC: Chronic conditions  HPI:  [name] is a 53 y.o. female with a past medical history of diabetes who presents today for diabetes.   She was last seen in June 2024.  At that time she was switched from tablet GLP-1 to injectable GLP-1.  Please see problem based assessment and plan for additional details.  Past Medical History:  Diagnosis Date   Allergic rhinitis    Arthritis    hips   Diabetes mellitus    Dizziness 06/25/2017   Fatigue 04/15/2015   FRACTURE, CLAVICLE, RIGHT 11/22/2008   Qualifier: Diagnosis of  By: Elizebeth Koller MD, Caron Presume     GERD (gastroesophageal reflux disease)    Hip pain, bilateral    ? lipomas   HIP PAIN, BILATERAL 04/05/2006   Annotation: since 1999 Qualifier: Diagnosis of  By: Frederico Hamman MD, Annabelle Harman     Hypertension    Irregular menstruation    Left-sided back pain 04/15/2015   Lipoma of other skin and subcutaneous tissue 05/05/2008   Qualifier: Diagnosis of  By: Elizebeth Koller MD, Ruben     Lymphedema    chronic, since age 23   Mammogram abnormal    06, s/p biopsy of mass and neg work up for neoplasm   Neck pain on right side 06/25/2017   Need for prophylactic vaccination with combined diphtheria-tetanus-pertussis (DTP) vaccine 07/15/2012   Obesity    Polyarthropathy    inflammatory   Right ovarian cyst    s/p resection of r ovary 06   RLQ PAIN 03/15/2009   Qualifier: Diagnosis of  By: Comer Locket MD, Vijay     Sebaceous cyst 07/15/2012   Shoulder pain 07/26/2015   Vaginal itching 12/27/2016   Wears glasses     MEDICATIONS:  Ozempic Lisinopril-HCTZ 20-12.5 mg daily Metformin at 1000 mg twice daily Crestor 20 mg Pantoprazole 20 mg daily Rosuvastatin 20 mg daily Gabapentin 300 mg as needed Glipizide 5 mg Iron tablet  Family History  Problem Relation Age of Onset   Hypertension Mother    Cancer Mother        breast cancer   Anemia Mother    Sickle cell trait Sister    Cancer Maternal Uncle        breast cancer    Cancer Paternal Grandmother        lung cancer   Hypertension Paternal Grandmother    Diabetes Paternal Grandmother    Colon cancer Neg Hx    Colon polyps Neg Hx    Esophageal cancer Neg Hx    Rectal cancer Neg Hx    Stomach cancer Neg Hx     Past Surgical History:  Procedure Laterality Date   BREAST BIOPSY Left    BREAST REDUCTION SURGERY  1996   CYST EXCISION Left 09/28/2021   Procedure: EXCISION CHRONIC POSTERIOR NECK ABSCESS;  Surgeon: Abigail Miyamoto, MD;  Location: [facility] OR;  Service: General;  Laterality: Left;   DILATION AND CURETTAGE OF UTERUS     MASS EXCISION N/A 02/27/2013   Procedure: CYST REMOVAL NECK;  Surgeon: Shelly Rubenstein, MD;  Location: [facility];  Service: General;  Laterality: N/A;  Excision chronic posterior neck cyst   MASS EXCISION Left 10/05/2019   Procedure: EXCISION POSTERIOR NECK SEBACEOUS CYST AND LEFT HIP MASS;  Surgeon: Abigail Miyamoto, MD;  Location:  SURGERY CENTER;  Service: General;  Laterality: Left;  Hip & neck sites   MYOMECTOMY ABDOMINAL APPROACH     REDUCTION MAMMAPLASTY Bilateral 1996  surgery of fibroids     but still has fibroids   TONSILLECTOMY AND ADENOIDECTOMY       Social History   Socioeconomic History   Marital status: Married    Spouse name: Maely Clements   Number of children: 1   Years of education: 10   Highest education level: Not on file  Occupational History   Not on file  Tobacco Use   Smoking status: Former    Current packs/day: 0.00    Types: Cigarettes    Quit date: 1995    Years since quitting: 30.2   Smokeless tobacco: Never   Tobacco comments:    Quit 1995.  Vaping Use   Vaping status: Never Used  Substance and Sexual Activity   Alcohol use: No    Alcohol/week: 0.0 standard drinks of alcohol   Drug use: No   Sexual activity: Yes  Other Topics Concern   Not on file  Social History Narrative   Works as a Engineer, structural.   Lives at home with husband and daughter.   Social  Drivers of Corporate investment banker Strain: Not on file  Food Insecurity: Food Insecurity Present (09/10/2022)   Hunger Vital Sign    Worried About Running Out of Food in the Last Year: Sometimes true    Ran Out of Food in the Last Year: Sometimes true  Transportation Needs: No Transportation Needs (09/10/2022)   PRAPARE - Administrator, Civil Service (Medical): No    Lack of Transportation (Non-Medical): No  Physical Activity: Not on file  Stress: Not on file  Social Connections: Moderately Integrated (06/14/2022)   Social Connection and Isolation Panel [NHANES]    Frequency of Communication with Friends and Family: More than three times a week    Frequency of Social Gatherings with Friends and Family: More than three times a week    Attends Religious Services: More than 4 times per year    Active Member of [employer] or Organizations: No    Attends Banker Meetings: Never    Marital Status: Married  Catering manager Violence: Not At Risk (06/14/2022)   Humiliation, Afraid, Rape, and Kick questionnaire    Fear of Current or Ex-Partner: No    Emotionally Abused: No    Physically Abused: No    Sexually Abused: No    Review of Systems: ROS negative except for what is noted on the assessment and plan.  Objective:  There were no vitals filed for this visit.  Physical Exam: Constitutional: well-appearing *** sitting in ***, in no acute distress HENT: normocephalic atraumatic, mucous membranes moist Eyes: conjunctiva non-erythematous Neck: supple Cardiovascular: regular rate and rhythm, no m/r/g Pulmonary/Chest: normal work of breathing on room air, lungs clear to auscultation bilaterally Abdominal: soft, non-tender, non-distended MSK: normal bulk and tone Neurological: alert & oriented x 3, 5/5 strength in bilateral upper and lower extremities, normal gait Skin: warm and dry Psych: ***     Assessment & Plan:  No problem-specific Assessment & Plan notes  found for this encounter.    Patient discussed with Dr. Marjorie Smolder Andrej Spagnoli, D.O. [facility]  PGY-3 Pager: [phone]  Phone: [phone] Date 08/14/2023  Time 1:32 PM

## 2023-08-26 ENCOUNTER — Encounter: Payer: Self-pay | Admitting: Neurology

## 2023-08-26 ENCOUNTER — Ambulatory Visit: Payer: Self-pay | Admitting: Neurology

## 2023-08-29 ENCOUNTER — Other Ambulatory Visit (HOSPITAL_COMMUNITY): Payer: Self-pay

## 2023-08-30 ENCOUNTER — Telehealth: Payer: Self-pay

## 2023-08-30 ENCOUNTER — Other Ambulatory Visit (HOSPITAL_COMMUNITY): Payer: Self-pay

## 2023-08-30 NOTE — Telephone Encounter (Signed)
 Patient was identified as falling into the [place] Measure - Diabetes.   Patient was: Appointment scheduled with primary care provider in the next 30 days.

## 2023-09-10 ENCOUNTER — Other Ambulatory Visit: Payer: Self-pay | Admitting: Physician Assistant

## 2023-09-10 ENCOUNTER — Other Ambulatory Visit (HOSPITAL_COMMUNITY): Payer: Self-pay

## 2023-09-10 DIAGNOSIS — R42 Dizziness and giddiness: Secondary | ICD-10-CM

## 2023-09-10 MED ORDER — OZEMPIC (1 MG/DOSE) 4 MG/3ML ~~LOC~~ SOPN
1.0000 mg | PEN_INJECTOR | SUBCUTANEOUS | 2 refills | Status: AC
  Filled 2023-09-10 – 2023-09-25 (×2): qty 3, 28d supply, fill #0

## 2023-09-12 ENCOUNTER — Encounter: Admitting: Student

## 2023-09-17 ENCOUNTER — Encounter: Payer: Self-pay | Admitting: Physician Assistant

## 2023-09-17 ENCOUNTER — Other Ambulatory Visit (HOSPITAL_COMMUNITY): Payer: Self-pay

## 2023-09-19 ENCOUNTER — Other Ambulatory Visit

## 2023-09-23 ENCOUNTER — Other Ambulatory Visit (HOSPITAL_COMMUNITY): Payer: Self-pay

## 2023-09-25 ENCOUNTER — Ambulatory Visit
Admission: RE | Admit: 2023-09-25 | Discharge: 2023-09-25 | Discharge status: Home / Self Care | Source: Ambulatory Visit | Attending: Physician Assistant | Admitting: Physician Assistant

## 2023-09-25 ENCOUNTER — Other Ambulatory Visit (HOSPITAL_COMMUNITY): Payer: Self-pay

## 2023-09-25 DIAGNOSIS — R42 Dizziness and giddiness: Secondary | ICD-10-CM

## 2023-10-14 ENCOUNTER — Other Ambulatory Visit: Payer: Self-pay

## 2023-10-14 ENCOUNTER — Encounter (HOSPITAL_COMMUNITY): Payer: Self-pay | Admitting: *Deleted

## 2023-10-14 ENCOUNTER — Other Ambulatory Visit (HOSPITAL_COMMUNITY): Payer: Self-pay

## 2023-10-14 ENCOUNTER — Emergency Department (HOSPITAL_COMMUNITY)
Admission: EM | Admit: 2023-10-14 | Discharge: 2023-10-14 | Disposition: A | Discharge status: Home / Self Care | Attending: Student | Admitting: Student

## 2023-10-14 DIAGNOSIS — M25561 Pain in right knee: Secondary | ICD-10-CM | POA: Diagnosis present

## 2023-10-14 DIAGNOSIS — Z7984 Long term (current) use of oral hypoglycemic drugs: Secondary | ICD-10-CM | POA: Diagnosis not present

## 2023-10-14 DIAGNOSIS — Z79899 Other long term (current) drug therapy: Secondary | ICD-10-CM | POA: Diagnosis not present

## 2023-10-14 DIAGNOSIS — M79604 Pain in right leg: Secondary | ICD-10-CM

## 2023-10-14 NOTE — ED Triage Notes (Signed)
 Pt is here for right leg pain.  Pain is on the inside part of her knee.  Pt denies any injury.  She states that she felt "a big vein" in that area.

## 2023-10-14 NOTE — Discharge Instructions (Signed)
 It was a pleasure taking care of you here today.  I wrote for an ultrasound of your leg to be performed tomorrow.  Follow the instructions on your discharge paperwork.  We did talk about an x-ray which you declined.  If you develop chest pain, shortness of breath, numbness, weakness, redness or inability to walk on the leg please seek close for reevaluation

## 2023-10-14 NOTE — ED Provider Notes (Signed)
 [place] EMERGENCY DEPARTMENT AT [facility] Provider Note   CSN: [id number] Arrival date & time: 10/14/23  1710    History  Chief Complaint  Patient presents with   Leg Pain    Sharon Fisher is a 53 y.o. female here for evaluation of right medial aspect knee pain.  Patient states started yesterday.  No recent injury or trauma.  States she felt a "cord" to the medial posterior aspect of her leg and she is concerned for blood clot.  No history of similar.  No redness, warmth, edema.  No history of PE or DVT.  No recent surgery, immobilization malignancy.  Able to ambulate without difficulty. HPI     Home Medications Prior to Admission medications   Medication Sig Start Date End Date Taking? Authorizing Provider  acetaminophen  (TYLENOL ) 500 MG tablet Take 2 tablets (1,000 mg total) by mouth every 8 (eight) hours as needed (pain). 11/19/16   Juliette Oh, MD  ferrous sulfate 325 (65 FE) MG tablet Take 325 mg by mouth daily with breakfast.    [provider]  gabapentin  (NEURONTIN ) 300 MG capsule Take 1 capsule (300 mg total) by mouth at bedtime as needed (neuropathy in feet). 03/27/23   Masters, Alston Jerry, DO  glipiZIDE  (GLUCOTROL ) 5 MG tablet Take 1 tablet (5 mg total) by mouth daily before breakfast. 06/14/22   Joette Mustard, MD  glipiZIDE  (GLUCOTROL ) 5 MG tablet Take 1 tablet (5 mg total) by mouth daily. 01/02/23     glucose blood (CONTOUR NEXT TEST) test strip Use up to two times per day. 08/20/18   Seawell, Jaimie A, DO  ibuprofen  (ADVIL ) 800 MG tablet Take 1 tablet (800 mg total) by mouth every 8 (eight) hours as needed. 03/29/22   Masters, Alston Jerry, DO  itraconazole  (SPORANOX ) 100 MG capsule Take 2 capsules (200 mg total) by mouth daily for 5 days 02/14/22   Masters, Alston Jerry, DO  Lancets MISC Use up to two times per day 08/20/18   Seawell, Jaimie A, DO  lisinopril -hydrochlorothiazide  (ZESTORETIC ) 20-12.5 MG tablet Take 1 tablet by mouth daily. 06/14/22   Joette Mustard, MD  lisinopril -hydrochlorothiazide  (ZESTORETIC ) 20-12.5 MG tablet Take 1 tablet by mouth daily. 01/02/23     lisinopril -hydrochlorothiazide  (ZESTORETIC ) 20-12.5 MG tablet Take 1 tablet by mouth daily. 03/11/23     meclizine  (ANTIVERT ) 25 MG tablet Take 1 tablet (25 mg total) by mouth 3 (three) times daily as needed for nausea and dizziness. 01/23/23     meclizine  (ANTIVERT ) 25 MG tablet Take 1 tablet (25 mg total) by mouth 3 (three) times daily as needed for nausea and dizziness 06/11/23     metFORMIN  (GLUCOPHAGE ) 1000 MG tablet Take 1 tablet (1,000 mg total) by mouth 2 (two) times daily with a meal. 06/14/22   Joette Mustard, MD  metFORMIN  (GLUCOPHAGE ) 1000 MG tablet Take 1 tablet (1,000 mg total) by mouth 2 (two) times daily. 01/02/23     metFORMIN  (GLUCOPHAGE ) 1000 MG tablet Take 1 tablet (1,000 mg total) by mouth 2 (two) times daily. 03/11/23     pantoprazole  (PROTONIX ) 20 MG tablet Take 1 tablet (20 mg total) by mouth daily for acid reflux. 03/11/23     pantoprazole  (PROTONIX ) 20 MG tablet Take 1 tablet (20 mg total) by mouth daily for acid reflux 06/11/23     polyethylene glycol (MIRALAX / GLYCOLAX) 17 g packet Take 17 g by mouth 2 (two) times daily.    [provider]  rosuvastatin  (CRESTOR ) 20 MG tablet Take 1  tablet (20 mg total) by mouth daily. 06/14/22   Joette Mustard, MD  rosuvastatin  (CRESTOR ) 20 MG tablet Take 1 tablet (20 mg total) by mouth daily. 01/02/23     rosuvastatin  (CRESTOR ) 20 MG tablet Take 1 tablet (20 mg total) by mouth daily. 03/11/23     Semaglutide , 1 MG/DOSE, (OZEMPIC , 1 MG/DOSE,) 4 MG/3ML SOPN Inject 1 mg into the skin once a week. 09/10/23     Semaglutide ,0.25 or 0.5MG /DOS, (OZEMPIC , 0.25 OR 0.5 MG/DOSE,) 2 MG/3ML SOPN Inject 0.25 mg into the skin once a week. 11/26/22   Nooruddin, Saad, MD  Semaglutide ,0.25 or 0.5MG /DOS, (OZEMPIC , 0.25 OR 0.5 MG/DOSE,) 2 MG/3ML SOPN Inject 0.5 mg into the skin once a week. 01/23/23     Semaglutide ,0.25 or 0.5MG /DOS,  (OZEMPIC , 0.25 OR 0.5 MG/DOSE,) 2 MG/3ML SOPN Inject 0.5 mg into the skin once a week. 01/02/23     Semaglutide ,0.25 or 0.5MG /DOS, (OZEMPIC , 0.25 OR 0.5 MG/DOSE,) 2 MG/3ML SOPN Inject 0.5 mg into the skin once a week. 03/11/23     enalapril  (VASOTEC ) 20 MG tablet TAKE 1 TABLET BY MOUTH DAILY. 05/02/20 05/23/20  Rehman, Areeg N, DO      Allergies    Shellfish allergy    Review of Systems   Review of Systems  Constitutional: Negative.   HENT: Negative.    Respiratory: Negative.    Cardiovascular: Negative.   Gastrointestinal: Negative.   Genitourinary: Negative.   Musculoskeletal:        Right knee pain  Skin: Negative.   Neurological: Negative.   All other systems reviewed and are negative.   Physical Exam Updated Vital Signs BP 123/86   Pulse 85   Temp 98.1 F (36.7 C) (Oral)   Resp 16   SpO2 100%  Physical Exam Vitals and nursing note reviewed.  Constitutional:      General: She is not in acute distress.    Appearance: She is well-developed. She is not ill-appearing, toxic-appearing or diaphoretic.  HENT:     Head: Atraumatic.  Eyes:     Pupils: Pupils are equal, round, and reactive to light.  Cardiovascular:     Rate and Rhythm: Normal rate.     Pulses:          Dorsalis pedis pulses are 2+ on the right side and 2+ on the left side.       Posterior tibial pulses are 2+ on the right side.  Pulmonary:     Effort: No respiratory distress.  Abdominal:     General: There is no distension.  Musculoskeletal:        General: Normal range of motion.     Cervical back: Normal range of motion.     Comments: Mild tenderness medial posterior aspect right knee.  Able to flex and extend.  Nontender joint lines.  Negative anterior drawer.  Compartments soft.  Skin:    General: Skin is warm and dry.     Capillary Refill: Capillary refill takes less than 2 seconds.     Comments: No edema, erythema, warmth.  No rashes or lesions to exposed skin.  Neurological:     General: No  focal deficit present.     Mental Status: She is alert.  Psychiatric:        Mood and Affect: Mood normal.     ED Results / Procedures / Treatments   Labs (all labs ordered are listed, but only abnormal results are displayed) Labs Reviewed - No data to display  EKG None  Radiology No results found.  Procedures Procedures    Medications Ordered in ED Medications - No data to display  ED Course/ Medical Decision Making/ A&P   53 year old here for evaluation of right knee pain.  No known injury or trauma.  She fell today "cord" to her knee and she was concerned for blood clot.  No history of similar.  No redness or warmth.  She is full range of motion.  No chest pain or shortness of breath.  I offered x-ray however patient declined.  She states she is here for ultrasound to rule out blood clot.  Unfortunately ultrasound has already left for the day.  History and exam not consistent with septic joint, gout, hemarthrosis, fracture, dislocation.  Her compartments are soft.  She is ambulatory and has full range of motion.  She has intact pulses low suspicion for ischemia.  I placed order for outpatient ultrasound.  Will have her follow-up tomorrow morning for this.  She will return if she develops any worsening symptoms, chest pain, shortness of breath.  The patient has been appropriately medically screened and/or stabilized in the ED. I have low suspicion for any other emergent medical condition which would require further screening, evaluation or treatment in the ED or require inpatient management.  Patient is hemodynamically stable and in no acute distress.  Patient able to ambulate in department prior to ED.  Evaluation does not show acute pathology that would require ongoing or additional emergent interventions while in the emergency department or further inpatient treatment.  I have discussed the diagnosis with the patient and answered all questions.  Pain is been managed while in the  emergency department and patient has no further complaints prior to discharge.  Patient is comfortable with plan discussed in room and is stable for discharge at this time.  I have discussed strict return precautions for returning to the emergency department.  Patient was encouraged to follow-up with PCP/specialist refer to at discharge.                                 Medical Decision Making Amount and/or Complexity of Data Reviewed External Data Reviewed: labs, radiology and notes. Radiology: ordered. Decision-making details documented in ED Course.  Risk OTC drugs. Decision regarding hospitalization. Diagnosis or treatment significantly limited by social determinants of health.           Final Clinical Impression(s) / ED Diagnoses Final diagnoses:  Right leg pain    Rx / DC Orders ED Discharge Orders          Ordered    [facility]        10/14/23 1731              Glenola Wheat A, PA-C 10/14/23 1740    Kommor, Madison, MD 10/15/23 1224

## 2023-10-15 ENCOUNTER — Ambulatory Visit (HOSPITAL_COMMUNITY)
Admission: RE | Admit: 2023-10-15 | Discharge: 2023-10-15 | Disposition: A | Discharge status: Home / Self Care | Source: Ambulatory Visit | Attending: Physician Assistant | Admitting: Physician Assistant

## 2023-10-15 DIAGNOSIS — M25561 Pain in right knee: Secondary | ICD-10-CM | POA: Diagnosis present

## 2023-10-15 DIAGNOSIS — M7989 Other specified soft tissue disorders: Secondary | ICD-10-CM | POA: Diagnosis not present

## 2023-11-26 ENCOUNTER — Telehealth: Payer: Self-pay | Admitting: Neurology

## 2023-11-26 NOTE — Telephone Encounter (Signed)
 Pt called to cancel appt the patient explain she already had procedure done doesn't;t need appt

## 2023-11-27 ENCOUNTER — Ambulatory Visit: Admitting: Neurology

## 2024-01-20 ENCOUNTER — Other Ambulatory Visit: Payer: Self-pay | Admitting: Family Medicine

## 2024-01-20 DIAGNOSIS — Z1231 Encounter for screening mammogram for malignant neoplasm of breast: Secondary | ICD-10-CM

## 2024-01-30 ENCOUNTER — Ambulatory Visit: Admitting: Family Medicine

## 2024-01-30 ENCOUNTER — Encounter: Payer: Self-pay | Admitting: Family Medicine

## 2024-01-30 VITALS — Temp 97.3°F | Resp 18 | Wt 277.6 lb

## 2024-01-30 DIAGNOSIS — E1142 Type 2 diabetes mellitus with diabetic polyneuropathy: Secondary | ICD-10-CM | POA: Diagnosis not present

## 2024-01-30 DIAGNOSIS — I493 Ventricular premature depolarization: Secondary | ICD-10-CM

## 2024-01-30 DIAGNOSIS — I890 Lymphedema, not elsewhere classified: Secondary | ICD-10-CM | POA: Diagnosis not present

## 2024-01-30 DIAGNOSIS — I951 Orthostatic hypotension: Secondary | ICD-10-CM

## 2024-01-30 DIAGNOSIS — H8103 Meniere's disease, bilateral: Secondary | ICD-10-CM

## 2024-01-30 DIAGNOSIS — I10 Essential (primary) hypertension: Secondary | ICD-10-CM

## 2024-01-30 DIAGNOSIS — I872 Venous insufficiency (chronic) (peripheral): Secondary | ICD-10-CM

## 2024-01-30 NOTE — Patient Instructions (Signed)
 VISIT SUMMARY: Today, we discussed your chronic dizziness, which has been ongoing for two years and worsens with positional changes. We also reviewed your type 2 diabetes, high blood pressure, and lymphedema. Your recent CT scan and EKG results were normal, but we need to further investigate the cause of your dizziness.  YOUR PLAN: -CHRONIC DIZZINESS WITH ORTHOSTATIC HYPOTENSION AND VERTIGO: Your chronic dizziness is likely due to orthostatic hypotension, which means your blood pressure drops significantly when you stand up, causing dizziness. We will reduce your lisinopril  dosage, encourage you to stay hydrated, recommend compression stockings, and start vestibular rehabilitation exercises. If needed, you can continue taking meclizine  for vertigo symptoms.  -TYPE 2 DIABETES MELLITUS WITH PERIPHERAL NEUROPATHY: Your type 2 diabetes is well-controlled with an A1c of 6.6. Peripheral neuropathy, which is nerve damage often caused by diabetes, is being monitored.  -ESSENTIAL HYPERTENSION: Your high blood pressure is well-controlled, but we need to adjust your medication to prevent dizziness when you stand up. We will reduce your lisinopril  dosage and ask you to monitor your blood pressure at home.  -PREMATURE VENTRICULAR CONTRACTIONS (PVCS): PVCs are extra heartbeats that are usually harmless but can cause palpitations and dizziness. We will refer you to a cardiologist to evaluate these further and rule out any potential cardiac causes of your dizziness.  -LYMPHEDEMA: Lymphedema is swelling due to fluid buildup, which can affect blood flow and contribute to dizziness. We recommend using compression stockings to help manage this condition.  INSTRUCTIONS: Please follow up with a cardiologist for further evaluation of your dizziness and PVCs. Monitor your blood pressure at home and stay hydrated. Begin using compression stockings and start vestibular rehabilitation exercises. Continue taking meclizine  if  needed for vertigo symptoms.  Vestibular (Balance) Exercises Introduction  You have a problem with your balance or equilibrium. Do not be afraid of your dizziness. Only you can build up the tolerance in your brain to overcome your dizziness. It is like an exercise for muscle building. It requires regular, capacity-extending work to build up strength or tolerance. Keep provoking your dizziness many times each day, realizing that each purposeful, controlled episode of dizziness brings you closer to your last one. Once you have gained a measure of improvement and control in your practice sessions, seek out sports or other movement activities that have been difficult and spend increasing lengths of time on them until they no longer produce any symptoms.  How do vestibular exercises work?  The purpose of these exercises is to improve one's central or brain's compensation for injuries or abnormalities within the vestibular or balance system. The brain interprets information gained from the vestibular or balance system. When there is an injury or abnormality in any portion of this system, the brain must be retrained or taught to interpret correctly the information it receives. Vestibular exercises merely stimulate the vestibular apparatus. This stimulation produces information to be processed by the brain. The goal in repeating these exercises is for the brain to learn to tolerate and accurately interpret this type of stimulation. By doing these exercises repetitively, one can even teach the brain to adapt to an abnormal stimulus. These exercises work in much the same way as the exercises skaters or dancers do to keep from becoming dizzy when they spin around rapidly. Stated simply, one must seek out and overcome those positions or situations which cause dizziness. Avoiding them will only prolong one's convalescence.  Aims of exercises:  To train movement of the eyes, independent of the head. To practice  balancing in everyday situations with special attention to developing the use of the eyes and the muscle sense awareness. To practice head movements that cause dizziness. To become accustomed to moving about naturally in daylight and in the dark. Generally, to encourage the re-building of confidence in making easy, relaxed, spontaneous movements. When you begin:  During the first few times the exercise is performed, you should have another person present in case the dizziness becomes very severe.  When should you stop doing the exercise?  These exercises should be done at least three times a day for a minimum of 6 to 12 weeks or until the dizziness goes away altogether. Stopping before complete resolution of dizziness often results in a relapse in symptoms. The point at which one stops the exercises is when one has no dizziness for two consecutive weeks. The exercise may be stopped and restarted again at any time if dizziness returns.  The following exercises should be performed twice daily. The exercises are designed to challenge your balance system and often cause symptoms of dizziness. This is a normal response to these stimulating exercises. You should try to work through these symptoms if possible. If you feel you cannot, have your nurse contact an inpatient physical therapist for assistance.  Methods:  Work up to doing each movement 20 times. All exercises are started in exaggerated slow time and gradually increase speed to a more rapid rate. Be sure to continue exercises even though you become dizzy. Pause and rest only if you become nauseated or sick to your stomach. If ill, after resting, try a different exercise. If you still become sick to your stomach, postpone further work until your next session.  Head Exercises  Bending: In a sitting position, bend your head down to look at the floor then up to look at the ceiling. Lead your head with your eyes focusing on the floor and the  ceiling. Repeat this 10 times. Stop and wait for symptoms to resolve, about 30 seconds. Repeat entire process 2 more times. Turning (side to side): In a sitting position, turn your head to the right and then left, leading your head with your eyes as if you are watching a tennis match. Turn your head at a speed brisk enough to generate symptoms but not so fast that you strain your neck. (Slowly first, then quickly.) Go back and forth 10 times, and then wait for 30 seconds (or until symptoms resolve). Repeat entire process 2 more times. As the dizziness improves:  Perform head exercises with eyes closed. Progress to standing while performing head exercises. The closer together you put your feet, the more challenging it becomes.     Sitting  Shrug shoulders - 20 times. Turn shoulders to right and then to left - 20 times. Rotate head, shoulders and trunk - 20 times each: Rotate upper body right to left with eyes open, then repeat with eyes closed. Rotate upper body left to right with eyes open, then repeat with eyes closed. Bend forward and touch ground then sit up. Keep eyes focused on wall - 20 times. Bend forward and touch ground then sit up. Move eyes to floor and back - 20 times. Eye movements (head is still): Up and down (focusing on finger). Side to side (focusing on finger). Finger to tip of nose and out (focusing on finger).  Standing Change from sitting to standing and back again 20 times with eyes open. Repeat with eyes closed. Standing with one foot in front  of the other In a corner, practice standing "heel to toe" (one foot in front of the other with the heel of one foot touching the toe of the other foot) with eyes open for 30 seconds. The goal is to stand for the entire 30 seconds without touching the wall. You may make this more challenging by crossing arms across chest. If this is too hard at first, try standing "almost heel to toe" (with feet touching at big toes and  ankles). Once you have mastered these with eyes open, practice with eyes closed. Standing on a cushion In a corner, stand on a couch cushion or several pillows. Try to stand still without touching the wall for 30 seconds. Practice with eyes open. When this is easy, practice with eyes closed. You may make this more challenging by placing feet closer together. Crossing arm across your chest also makes this more challenging. You should progress by performing this in the most challenging position possible. Standing and throwing Throw a small rubber ball from hand to hand above eye level. Throw ball from hand to hand under one knee. Stand with heels together Look straight ahead and hold balance (attempt only with assistance). Stand on one foot Perform first with eyes open then with eyes closed (attempt only with assistance). Miscellaneous Do activities involving stooping, stretching, bending, going up and down stairs (attempt only with assistance).  Walking Walking in a straight line In a hallway or next to a wall, practice walking in a straight line for 5 minutes with one foot in front of the other or "heel to toe" (with the heel of one foot touching the toe of the other foot). If this is too hard at first, practice walking "almost heel to toe" and gradually work to heel to toe touching. Walking combined with head turning In a hallway or open space, practice walking in a straight line while turning head and eyes left and right with every other step (i.e. when you step with your left you look left, when you step with your right you look right). Continue for the length of the hallway or about 20 feet. Repeat the process 3 times. Now repeat the entire process 3 more times but this time looking at the ceiling or floor. You will need to rest between repetitions and let symptoms calm. Walk across the room Perform first with eyes open, then with eyes closed (attempt only with assistance). Lying  Down Sitting on side of bed, quickly lie down to your left side swinging your feet onto the bed as you do. Lie there for 30 seconds or until symptoms resolve. Repeat 3 times. Now repeat 3 times to the right. Eye Exercises - Gaze Stabilization Tips  Target must remain in focus, not blurry and appear stationary while head is in motion. Perform exercise with small head movement (45 degrees on either side). Speed of head motion should be increased as long as target remains in focus. If you wear glasses, wear them while performing exercises. These exercises may provoke dizziness or nausea. Try to work through these symptoms. Rest between each exercise. Exercises demand full concentration. Avoid distractions. For safety, standing exercises should be performed next to a counter or next to someone. Gaze Stabilization Keep eyes fixed on a single stationary target held in hand or placed on a wall 3-10 feet away. Now move head side to side for 30 seconds. Repeat 3 times. Now repeat 3 times while moving head up and down for 30 seconds.  Do 3 sessions per day. You may progress this by beginning in a sitting position then move to standing with feet apart, standing with feet together, standing heel to toe, marching in place, or standing on form. This will also be more difficult if object you are focusing on is placed on a "busy wallpaper" or a checkerboard. Smooth pursuit Holding a single target, keep eyes fixed on the target. Slowly move it side to side for 30 seconds while head stays still. Perform in the sitting position. Progress to the standing position as tolerated. Now repeat moving head up and down. Repeat 20 times in each direction per session. Do 3 sessions per day. Head and eyes same direction Holding a single target (playing card or pencil) keep eyes fixed on target. Slowly move target, head, and eyes in same direction (up and down, side to side) for 30 seconds. Perform in sitting position, you can  progress this to standing as you improve. Repeat 3 times per session. This will be more difficult if you have a "background" of a busy wallpaper. Do 3 sessions per day. Head and eyes opposite direction Holding your target, keep your eyes focused on it and begin to slowly move target (up and down, side to side) while moving your head in the opposite direction of the target for 30 seconds. Repeat 3 times per session. You may progress from sitting to standing as in above exercise. Do 3 sessions per day. Continue to perform these exercises at least twice daily until your symptoms resolve. If these exercises do not produce symptoms, you do not need to continue them. Begin a walking program at home. You should walk 5 days a week. Start by walking for 5 minutes. Increase the length of walking time by 5 minutes each week until you can walk for 30 minutes continuously.

## 2024-01-30 NOTE — Progress Notes (Signed)
 Assessment & Plan   Assessment/Plan:     Assessment & Plan Chronic dizziness with orthostatic hypotension and vertigo Chronic dizziness for two years, exacerbated by positional changes such as bending over and standing up. Symptoms include tunnel vision and a sensation of pressure on the head. Orthostatic hypotension confirmed with a significant drop in blood pressure from standing to supine position. Previous CT scan of the brain was normal, ruling out acute or focal abnormalities. Meclizine  has been ineffective in managing symptoms. Differential diagnosis includes vertigo due to inner ear issues and orthostatic hypotension possibly related to medication or fluid status. - Refer to cardiology for evaluation of dizziness and potential cardiac causes. - Reduce lisinopril  dosage to address orthostatic hypotension. - Encourage hydration to maintain adequate fluid volume. - Recommend compression stockings to aid venous return. - Initiate vestibular rehabilitation exercises to address vertigo. - Prescribe meclizine  if needed for vertigo symptoms.  Type 2 diabetes mellitus with peripheral neuropathy Type 2 diabetes is well-controlled with an A1c of 6.6, reduced from 6.7. Peripheral neuropathy is a known complication.  Essential hypertension Blood pressure is currently well-controlled. Orthostatic hypotension noted, possibly related to current antihypertensive regimen including lisinopril  and hydrochlorothiazide . Discussed the potential need to adjust antihypertensive therapy to prevent orthostatic hypotension. - Reduce lisinopril  dosage to address orthostatic hypotension. - Monitor blood pressure at home with a blood pressure machine.  Premature ventricular contractions Premature ventricular contractions noted on previous EKG. She expresses concern about PVCs and requests cardiology referral. PVCs are common and often benign but warrant further evaluation due to dizziness and palpitations.  Discussed the importance of evaluating cardiac function to rule out potential causes of dizziness. - Refer to cardiology for evaluation of PVCs and potential cardiac causes of dizziness.  Lymphedema Lymphedema present, contributing to venous insufficiency and possibly affecting orthostatic hypotension. - Recommend compression stockings to aid venous return and manage lymphedema.      There are no discontinued medications.  Return in about 1 month (around 03/01/2024) for DM, BP, HLD, fasting labs.        Subjective:   Encounter date: 01/30/2024  Sharon Fisher is a 53 y.o. female who has Type 2 diabetes mellitus with peripheral neuropathy (HCC); Mononeuritis; Benign essential HTN; Allergic rhinitis; Asthma; GERD; OVARIAN CYST; IRREGULAR MENSTRUATION; Inflammatory polyarthropathy (HCC); Hip pain; Palpitations; Abnormal mammogram; Abscess of neck; HLD (hyperlipidemia); Vulvovaginal candidiasis; Fatigue; Healthcare maintenance; Lump of left breast; Vaginal itching; Anxiety; Dizziness; Iron deficiency anemia due to chronic blood loss; Lipoma; Tinea versicolor; and Right foot pain on their problem list..   She  has a past medical history of Allergic rhinitis, Arthritis, Diabetes mellitus, Dizziness (06/25/2017), Fatigue (04/15/2015), FRACTURE, CLAVICLE, RIGHT (11/22/2008), GERD (gastroesophageal reflux disease), Hip pain, bilateral, HIP PAIN, BILATERAL (04/05/2006), Hypertension, Irregular menstruation, Left-sided back pain (04/15/2015), Lipoma of other skin and subcutaneous tissue (05/05/2008), Lymphedema, Mammogram abnormal, Neck pain on right side (06/25/2017), Need for prophylactic vaccination with combined diphtheria-tetanus-pertussis (DTP) vaccine (07/15/2012), Obesity, Polyarthropathy, Right ovarian cyst, RLQ PAIN (03/15/2009), Sebaceous cyst (07/15/2012), Shoulder pain (07/26/2015), Vaginal itching (12/27/2016), and Wears glasses..   She presents with chief complaint of Establish Care  (HM due- vaccinations (patient declined), mammogram and diabetic eye exam is scheduled; foot exam ), Heart Problem (Referral for cardiology due to (ventricular premature depolarization) ), and Dizziness (Pt c/o of dizziness for 2 years; became worse overtime; CT scan done 09/25/2023; she is currently taking Meclizine  25 MG tablet but voiced it hasn't helped much ) .   Discussed the use of AI scribe  software for clinical note transcription with the patient, who gave verbal consent to proceed.  History of Present Illness Sharon Fisher is a 53 year old female with type 2 diabetes and peripheral neuropathy who presents with chronic dizziness.  She has been experiencing chronic dizziness for the past two years, which worsens with positional changes such as bending over and standing up. This leads to episodes of near blindness, tunnel vision, and a sensation of pressure on the top of her head. The dizziness is described as causing a spinning sensation of the room. Meclizine  has been used without significant improvement.  A CT scan of the brain in April showed normal results with no acute or focal abnormalities. An EKG from September showed sinus rhythm with occasional PVCs.  Her type 2 diabetes is managed with Ozempic  once a week, and her recent A1c was 6.6. She also has high blood pressure, previously managed with lisinopril  and hydrochlorothiazide , and experiences heartburn.  She mentions having lymphedema and a history of ear tubes placed twice, with no recent vestibular rehabilitation exercises performed. 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     ROS  Past Surgical History:  Procedure Laterality Date   BREAST BIOPSY Left    BREAST REDUCTION SURGERY  1996   CYST EXCISION Left 09/28/2021   Procedure: EXCISION CHRONIC POSTERIOR NECK ABSCESS;  Surgeon: Vernetta Berg, MD;  Location: [facility] OR;  Service: General;  Laterality: Left;   DILATION AND CURETTAGE OF UTERUS     MASS  EXCISION N/A 02/27/2013   Procedure: CYST REMOVAL NECK;  Surgeon: Berg DELENA Vernetta, MD;  Location: [facility];  Service: General;  Laterality: N/A;  Excision chronic posterior neck cyst   MASS EXCISION Left 10/05/2019   Procedure: EXCISION POSTERIOR NECK SEBACEOUS CYST AND LEFT HIP MASS;  Surgeon: Vernetta Berg, MD;  Location: [facility];  Service: General;  Laterality: Left;  Hip & neck sites   MYOMECTOMY ABDOMINAL APPROACH     REDUCTION MAMMAPLASTY Bilateral 1996   surgery of fibroids     but still has fibroids   TONSILLECTOMY AND ADENOIDECTOMY      Outpatient Medications Prior to Visit  Medication Sig Dispense Refill   ferrous sulfate 325 (65 FE) MG tablet Take 325 mg by mouth daily with breakfast.     gabapentin  (NEURONTIN ) 300 MG capsule Take 1 capsule (300 mg total) by mouth at bedtime as needed (neuropathy in feet). 90 capsule 3   ibuprofen  (ADVIL ) 800 MG tablet Take 1 tablet (800 mg total) by mouth every 8 (eight) hours as needed. 12 tablet 0   itraconazole  (SPORANOX ) 100 MG capsule Take 2 capsules (200 mg total) by mouth daily for 5 days 10 capsule 0   lisinopril -hydrochlorothiazide  (ZESTORETIC ) 20-12.5 MG tablet Take 1 tablet by mouth daily. 30 tablet 11   meclizine  (ANTIVERT ) 25 MG tablet Take 1 tablet (25 mg total) by mouth 3 (three) times daily as needed for nausea and dizziness. 90 tablet 3   metFORMIN  (GLUCOPHAGE ) 1000 MG tablet Take 1 tablet (1,000 mg total) by mouth 2 (two) times daily with a meal. (Patient taking differently: Take 1,000 mg by mouth 2 (two) times daily with a meal. 1500 MG) 180 tablet 3   pantoprazole  (PROTONIX ) 20 MG tablet Take 1 tablet (20 mg total) by mouth daily for acid reflux. 90 tablet 3   rosuvastatin  (CRESTOR ) 20 MG tablet Take 1 tablet (20 mg total) by mouth daily. 90 tablet 11  Semaglutide , 1 MG/DOSE, (OZEMPIC , 1 MG/DOSE,) 4 MG/3ML SOPN Inject 1 mg into the skin once a week. 3 mL 2   acetaminophen  (TYLENOL ) 500  MG tablet Take 2 tablets (1,000 mg total) by mouth every 8 (eight) hours as needed (pain). (Patient not taking: Reported on 01/30/2024) 30 tablet 0   glipiZIDE  (GLUCOTROL ) 5 MG tablet Take 1 tablet (5 mg total) by mouth daily before breakfast. (Patient not taking: Reported on 01/30/2024) 180 tablet 1   glipiZIDE  (GLUCOTROL ) 5 MG tablet Take 1 tablet (5 mg total) by mouth daily. (Patient not taking: Reported on 01/30/2024) 90 tablet 3   glucose blood (CONTOUR NEXT TEST) test strip Use up to two times per day. (Patient not taking: Reported on 01/30/2024) 100 each 4   Lancets MISC Use up to two times per day (Patient not taking: Reported on 01/30/2024) 100 each 4   lisinopril -hydrochlorothiazide  (ZESTORETIC ) 20-12.5 MG tablet Take 1 tablet by mouth daily. (Patient not taking: Reported on 01/30/2024) 90 tablet 3   lisinopril -hydrochlorothiazide  (ZESTORETIC ) 20-12.5 MG tablet Take 1 tablet by mouth daily. (Patient not taking: Reported on 01/30/2024) 90 tablet 3   meclizine  (ANTIVERT ) 25 MG tablet Take 1 tablet (25 mg total) by mouth 3 (three) times daily as needed for nausea and dizziness (Patient not taking: Reported on 01/30/2024) 90 tablet 3   metFORMIN  (GLUCOPHAGE ) 1000 MG tablet Take 1 tablet (1,000 mg total) by mouth 2 (two) times daily. (Patient not taking: Reported on 01/30/2024) 180 tablet 3   metFORMIN  (GLUCOPHAGE ) 1000 MG tablet Take 1 tablet (1,000 mg total) by mouth 2 (two) times daily. (Patient not taking: Reported on 01/30/2024) 180 tablet 3   pantoprazole  (PROTONIX ) 20 MG tablet Take 1 tablet (20 mg total) by mouth daily for acid reflux (Patient not taking: Reported on 01/30/2024) 90 tablet 3   polyethylene glycol (MIRALAX / GLYCOLAX) 17 g packet Take 17 g by mouth 2 (two) times daily. (Patient not taking: Reported on 01/30/2024)     rosuvastatin  (CRESTOR ) 20 MG tablet Take 1 tablet (20 mg total) by mouth daily. (Patient not taking: Reported on 01/30/2024) 90 tablet 3   rosuvastatin  (CRESTOR ) 20 MG tablet  Take 1 tablet (20 mg total) by mouth daily. (Patient not taking: Reported on 01/30/2024) 90 tablet 3   Semaglutide ,0.25 or 0.5MG /DOS, (OZEMPIC , 0.25 OR 0.5 MG/DOSE,) 2 MG/3ML SOPN Inject 0.25 mg into the skin once a week. (Patient not taking: Reported on 01/30/2024) 3 mL 3   Semaglutide ,0.25 or 0.5MG /DOS, (OZEMPIC , 0.25 OR 0.5 MG/DOSE,) 2 MG/3ML SOPN Inject 0.5 mg into the skin once a week. (Patient not taking: Reported on 01/30/2024) 3 mL 2   Semaglutide ,0.25 or 0.5MG /DOS, (OZEMPIC , 0.25 OR 0.5 MG/DOSE,) 2 MG/3ML SOPN Inject 0.5 mg into the skin once a week. (Patient not taking: Reported on 01/30/2024) 3 mL 2   Semaglutide ,0.25 or 0.5MG /DOS, (OZEMPIC , 0.25 OR 0.5 MG/DOSE,) 2 MG/3ML SOPN Inject 0.5 mg into the skin once a week. (Patient not taking: Reported on 01/30/2024) 3 mL 2   No facility-administered medications prior to visit.    Family History  Problem Relation Age of Onset   Hypertension Mother    Cancer Mother        breast cancer   Anemia Mother    Sickle cell trait Sister    Cancer Maternal Uncle        breast cancer   Cancer Paternal Grandmother        lung cancer   Hypertension Paternal Grandmother    Diabetes  Paternal Grandmother    Colon cancer Neg Hx    Colon polyps Neg Hx    Esophageal cancer Neg Hx    Rectal cancer Neg Hx    Stomach cancer Neg Hx     Social History   Socioeconomic History   Marital status: Married    Spouse name: Bassy Fetterly   Number of children: 1   Years of education: 10   Highest education level: Not on file  Occupational History   Not on file  Tobacco Use   Smoking status: Former    Current packs/day: 0.00    Types: Cigarettes    Quit date: 1995    Years since quitting: 30.6   Smokeless tobacco: Never   Tobacco comments:    Quit 1995.  Vaping Use   Vaping status: Never Used  Substance and Sexual Activity   Alcohol use: No    Alcohol/week: 0.0 standard drinks of alcohol   Drug use: No   Sexual activity: Yes    Birth  control/protection: None  Other Topics Concern   Not on file  Social History Narrative   Works as a Engineer, structural.   Lives at home with husband and daughter.   Social Drivers of Corporate investment banker Strain: Not on file  Food Insecurity: Food Insecurity Present (09/10/2022)   Hunger Vital Sign    Worried About Running Out of Food in the Last Year: Sometimes true    Ran Out of Food in the Last Year: Sometimes true  Transportation Needs: No Transportation Needs (09/10/2022)   PRAPARE - Administrator, Civil Service (Medical): No    Lack of Transportation (Non-Medical): No  Physical Activity: Not on file  Stress: Not on file  Social Connections: Moderately Integrated (06/14/2022)   Social Connection and Isolation Panel    Frequency of Communication with Friends and Family: More than three times a week    Frequency of Social Gatherings with Friends and Family: More than three times a week    Attends Religious Services: More than 4 times per year    Active Member of [employer] or Organizations: No    Attends Banker Meetings: Never    Marital Status: Married  Catering manager Violence: Not At Risk (06/14/2022)   Humiliation, Afraid, Rape, and Kick questionnaire    Fear of Current or Ex-Partner: No    Emotionally Abused: No    Physically Abused: No    Sexually Abused: No                                                                                                  Objective:  Physical Exam: Temp (!) 97.3 F (36.3 C) (Temporal)   Resp 18   Wt 277 lb 9.6 oz (125.9 kg)   LMP  (LMP Unknown)   SpO2 100%   BMI 44.81 kg/m    Physical Exam GENERAL: Alert, cooperative, well developed, no acute distress HEENT: Normocephalic, normal oropharynx, moist mucous membranes CHEST: Clear to auscultation bilaterally, no wheezes, rhonchi, or crackles CARDIOVASCULAR: Normal heart rate and rhythm, S1 and S2  normal without murmurs. Orthostatic hypotension with drop in blood  pressure from standing to supine. ABDOMEN: Soft, non-tender, non-distended, without organomegaly, normal bowel sounds EXTREMITIES: No cyanosis or edema NEUROLOGICAL: Cranial nerves grossly intact, moves all extremities without gross motor or sensory deficit Orthostatic VS for the past 72 hrs (Last 3 readings):  Orthostatic BP Patient Position BP Location Cuff Size Orthostatic Pulse  01/30/24 1607 133/78 Supine Left Arm -- 83  01/30/24 1606 151/80 Standing Left Arm -- 86  01/30/24 1605 121/74 Sitting Left Arm Normal 85  01/30/24 1536 130/85 Sitting Left Arm Large 83     Physical Exam  No results found.  No results found for this or any previous visit (from the past 2160 hours).      Beverley Adine Hummer, MD, MS

## 2024-02-05 ENCOUNTER — Ambulatory Visit
Admission: RE | Admit: 2024-02-05 | Discharge: 2024-02-05 | Disposition: A | Discharge status: Home / Self Care | Source: Ambulatory Visit | Attending: Family Medicine | Admitting: Family Medicine

## 2024-02-05 DIAGNOSIS — Z1231 Encounter for screening mammogram for malignant neoplasm of breast: Secondary | ICD-10-CM

## 2024-02-13 ENCOUNTER — Ambulatory Visit: Admitting: Family Medicine

## 2024-03-24 ENCOUNTER — Ambulatory Visit (INDEPENDENT_AMBULATORY_CARE_PROVIDER_SITE_OTHER)

## 2024-03-24 DIAGNOSIS — Z111 Encounter for screening for respiratory tuberculosis: Secondary | ICD-10-CM

## 2024-03-24 NOTE — Progress Notes (Signed)
 PPD Placement note Sharon Fisher Collum, 53 y.o. female is here today for placement of PPD test Reason for PPD test: For work Pt taken PPD test before: yes Injection was placed in left forearm by Laymon Gladis Sharps, CMA.   PPD placed on 03/24/2024.  Patient advised to return for reading within 48-72 hours. Nurse visit scheduled for Thursday March 26, 2024.

## 2024-03-26 ENCOUNTER — Ambulatory Visit: Payer: Self-pay | Admitting: Family Medicine

## 2024-03-26 ENCOUNTER — Ambulatory Visit

## 2024-03-26 LAB — TB SKIN TEST
Induration: 0 mm
TB Skin Test: NEGATIVE

## 2024-03-26 NOTE — Progress Notes (Signed)
 PPD Reading Note PPD read and results entered in Epic[employer]. Result: 0 mm induration. Interpretation: negative If test not read within 48-72 hours of initial placement, patient advised to repeat in other arm 1-3 weeks after this test. Allergic reaction: no

## 2024-04-06 ENCOUNTER — Other Ambulatory Visit (HOSPITAL_COMMUNITY): Payer: Self-pay

## 2024-06-18 ENCOUNTER — Ambulatory Visit: Payer: Self-pay

## 2024-06-18 NOTE — Progress Notes (Unsigned)
 " [facility] PRIMARY CARE [facility] [address] [place] [address] Dept: [phone] Dept Fax: [phone]  Acute Care Office Visit  Subjective:   Sharon Fisher 08-Dec-1970 06/19/2024  No chief complaint on file.   HPI:    The following portions of the patient's history were reviewed and updated as appropriate: past medical history, past surgical history, family history, social history, allergies, medications, and problem list.   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   Palpitations 03/19/2008   GERD 07/02/2006   Type 2 diabetes mellitus with peripheral neuropathy (HCC) 04/05/2006   Benign essential HTN 04/05/2006   Allergic rhinitis 04/05/2006   OVARIAN CYST 04/05/2006   IRREGULAR MENSTRUATION 04/05/2006   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 lipomas   HIP PAIN, BILATERAL 04/05/2006   Annotation: since 1999 Qualifier: Diagnosis of  By: Brandy MD, Dana     Hypertension    Irregular menstruation    Left-sided back pain 04/15/2015   Lipoma of other skin and subcutaneous tissue 05/05/2008    Qualifier: Diagnosis of  By: Alvena MD, Ruben     Lymphedema    chronic, since age 41   Mammogram abnormal    06, s/p biopsy of mass and neg work up for neoplasm   Neck pain on right side 06/25/2017   Need for prophylactic vaccination with combined diphtheria-tetanus-pertussis (DTP) vaccine 07/15/2012   Obesity    Polyarthropathy    inflammatory   Right ovarian cyst    s/p resection of r ovary 06   RLQ PAIN 03/15/2009   Qualifier: Diagnosis of  By: Loletta MD, Vijay     Sebaceous cyst 07/15/2012   Shoulder pain 07/26/2015   Vaginal itching 12/27/2016   Wears glasses    Past Surgical History:  Procedure Laterality Date   BREAST BIOPSY Left    BREAST REDUCTION SURGERY  1996   CYST EXCISION Left 09/28/2021   Procedure: EXCISION CHRONIC POSTERIOR NECK ABSCESS;  Surgeon: Vernetta Berg, MD;  Location: [facility] OR;  Service: General;  Laterality: Left;   DILATION AND CURETTAGE OF UTERUS     MASS EXCISION N/A 02/27/2013   Procedure: CYST REMOVAL NECK;  Surgeon: Berg DELENA Vernetta, MD;  Location: [facility];  Service: General;  Laterality: N/A;  Excision chronic posterior neck cyst   MASS EXCISION Left 10/05/2019   Procedure: EXCISION POSTERIOR NECK SEBACEOUS CYST AND LEFT HIP MASS;  Surgeon: Vernetta Berg, MD;  Location: [facility];  Service: General;  Laterality: Left;  Hip & neck sites   MYOMECTOMY ABDOMINAL APPROACH  REDUCTION MAMMAPLASTY Bilateral 1996   surgery of fibroids     but still has fibroids   TONSILLECTOMY AND ADENOIDECTOMY     Family History  Problem Relation Age of Onset   Breast cancer Mother    Hypertension Mother    Anemia Mother    Sickle cell trait Sister    Breast cancer Maternal Uncle    Cancer Paternal Grandmother        lung cancer   Hypertension Paternal Grandmother    Diabetes Paternal Grandmother    Colon cancer Neg Hx    Colon polyps Neg Hx    Esophageal cancer Neg Hx    Rectal cancer Neg Hx    Stomach cancer  Neg Hx    Current Medications[1] Allergies[2]   ROS: A complete ROS was performed with pertinent positives/negatives noted in the HPI. The remainder of the ROS are negative.    Objective:   There were no vitals filed for this visit.  GENERAL: Well-appearing, in NAD. Well nourished.  SKIN: Pink, warm and dry. No rash, lesion, ulceration, or ecchymoses.  NECK: Trachea midline. Full ROM w/o pain or tenderness. No lymphadenopathy.  RESPIRATORY: Chest wall symmetrical. Respirations even and non-labored. Breath sounds clear to auscultation bilaterally.  CARDIAC: S1, S2 present, regular rate and rhythm. Peripheral pulses 2+ bilaterally.  MSK: Muscle tone and strength appropriate for age. Joints w/o tenderness, redness, or swelling. EXTREMITIES: Without clubbing, cyanosis, or edema.  NEUROLOGIC: No motor or sensory deficits. Steady, even gait.  PSYCH/MENTAL STATUS: Alert, oriented x 3. Cooperative, appropriate mood and affect.    No results found for any visits on 06/19/24.    Assessment & Plan:     There are no diagnoses linked to this encounter. No orders of the defined types were placed in this encounter.  No orders of the defined types were placed in this encounter.  Lab Orders  No laboratory test(s) ordered today   No images are attached to the encounter or orders placed in the encounter.  No follow-ups on file.   Rosina Senters, FNP    [1]  Current Outpatient Medications:    acetaminophen  (TYLENOL ) 500 MG tablet, Take 2 tablets (1,000 mg total) by mouth every 8 (eight) hours as needed (pain). (Patient not taking: Reported on 01/30/2024), Disp: 30 tablet, Rfl: 0   ferrous sulfate 325 (65 FE) MG tablet, Take 325 mg by mouth daily with breakfast., Disp: , Rfl:    gabapentin  (NEURONTIN ) 300 MG capsule, Take 1 capsule (300 mg total) by mouth at bedtime as needed (neuropathy in feet)., Disp: 90 capsule, Rfl: 3   glipiZIDE  (GLUCOTROL ) 5 MG tablet, Take 1 tablet (5 mg total) by  mouth daily before breakfast. (Patient not taking: Reported on 01/30/2024), Disp: 180 tablet, Rfl: 1   glipiZIDE  (GLUCOTROL ) 5 MG tablet, Take 1 tablet (5 mg total) by mouth daily. (Patient not taking: Reported on 01/30/2024), Disp: 90 tablet, Rfl: 3   glucose blood (CONTOUR NEXT TEST) test strip, Use up to two times per day. (Patient not taking: Reported on 01/30/2024), Disp: 100 each, Rfl: 4   ibuprofen  (ADVIL ) 800 MG tablet, Take 1 tablet (800 mg total) by mouth every 8 (eight) hours as needed., Disp: 12 tablet, Rfl: 0   itraconazole  (SPORANOX ) 100 MG capsule, Take 2 capsules (200 mg total) by mouth daily for 5 days, Disp: 10 capsule, Rfl: 0   Lancets MISC, Use up to two times per day (Patient not taking: Reported on 01/30/2024), Disp: 100 each, Rfl:  4   lisinopril -hydrochlorothiazide  (ZESTORETIC ) 20-12.5 MG tablet, Take 1 tablet by mouth daily., Disp: 30 tablet, Rfl: 11   lisinopril -hydrochlorothiazide  (ZESTORETIC ) 20-12.5 MG tablet, Take 1 tablet by mouth daily. (Patient not taking: Reported on 01/30/2024), Disp: 90 tablet, Rfl: 3   lisinopril -hydrochlorothiazide  (ZESTORETIC ) 20-12.5 MG tablet, Take 1 tablet by mouth daily. (Patient not taking: Reported on 01/30/2024), Disp: 90 tablet, Rfl: 3   meclizine  (ANTIVERT ) 25 MG tablet, Take 1 tablet (25 mg total) by mouth 3 (three) times daily as needed for nausea and dizziness., Disp: 90 tablet, Rfl: 3   meclizine  (ANTIVERT ) 25 MG tablet, Take 1 tablet (25 mg total) by mouth 3 (three) times daily as needed for nausea and dizziness (Patient not taking: Reported on 01/30/2024), Disp: 90 tablet, Rfl: 3   metFORMIN  (GLUCOPHAGE ) 1000 MG tablet, Take 1 tablet (1,000 mg total) by mouth 2 (two) times daily with a meal. (Patient taking differently: Take 1,000 mg by mouth 2 (two) times daily with a meal. 1500 MG), Disp: 180 tablet, Rfl: 3   metFORMIN  (GLUCOPHAGE ) 1000 MG tablet, Take 1 tablet (1,000 mg total) by mouth 2 (two) times daily. (Patient not taking: Reported on  01/30/2024), Disp: 180 tablet, Rfl: 3   metFORMIN  (GLUCOPHAGE ) 1000 MG tablet, Take 1 tablet (1,000 mg total) by mouth 2 (two) times daily. (Patient not taking: Reported on 01/30/2024), Disp: 180 tablet, Rfl: 3   pantoprazole  (PROTONIX ) 20 MG tablet, Take 1 tablet (20 mg total) by mouth daily for acid reflux., Disp: 90 tablet, Rfl: 3   pantoprazole  (PROTONIX ) 20 MG tablet, Take 1 tablet (20 mg total) by mouth daily for acid reflux (Patient not taking: Reported on 01/30/2024), Disp: 90 tablet, Rfl: 3   polyethylene glycol (MIRALAX / GLYCOLAX) 17 g packet, Take 17 g by mouth 2 (two) times daily. (Patient not taking: Reported on 01/30/2024), Disp: , Rfl:    rosuvastatin  (CRESTOR ) 20 MG tablet, Take 1 tablet (20 mg total) by mouth daily., Disp: 90 tablet, Rfl: 11   rosuvastatin  (CRESTOR ) 20 MG tablet, Take 1 tablet (20 mg total) by mouth daily. (Patient not taking: Reported on 01/30/2024), Disp: 90 tablet, Rfl: 3   rosuvastatin  (CRESTOR ) 20 MG tablet, Take 1 tablet (20 mg total) by mouth daily. (Patient not taking: Reported on 01/30/2024), Disp: 90 tablet, Rfl: 3   Semaglutide , 1 MG/DOSE, (OZEMPIC , 1 MG/DOSE,) 4 MG/3ML SOPN, Inject 1 mg into the skin once a week., Disp: 3 mL, Rfl: 2   Semaglutide ,0.25 or 0.5MG /DOS, (OZEMPIC , 0.25 OR 0.5 MG/DOSE,) 2 MG/3ML SOPN, Inject 0.25 mg into the skin once a week. (Patient not taking: Reported on 01/30/2024), Disp: 3 mL, Rfl: 3   Semaglutide ,0.25 or 0.5MG /DOS, (OZEMPIC , 0.25 OR 0.5 MG/DOSE,) 2 MG/3ML SOPN, Inject 0.5 mg into the skin once a week. (Patient not taking: Reported on 01/30/2024), Disp: 3 mL, Rfl: 2   Semaglutide ,0.25 or 0.5MG /DOS, (OZEMPIC , 0.25 OR 0.5 MG/DOSE,) 2 MG/3ML SOPN, Inject 0.5 mg into the skin once a week. (Patient not taking: Reported on 01/30/2024), Disp: 3 mL, Rfl: 2   Semaglutide ,0.25 or 0.5MG /DOS, (OZEMPIC , 0.25 OR 0.5 MG/DOSE,) 2 MG/3ML SOPN, Inject 0.5 mg into the skin once a week. (Patient not taking: Reported on 01/30/2024), Disp: 3 mL, Rfl: 2 [2]   Allergies Allergen Reactions   Shellfish Allergy Anaphylaxis   Other Other (See Comments)    Dog and cat dander    "

## 2024-06-18 NOTE — Telephone Encounter (Signed)
 FYI Only or Action Required?: FYI only for provider: appointment scheduled on 01.16.26.  Patient was last seen in primary care on 01/30/2024 by Sebastian Beverley NOVAK, MD.  Called Nurse Triage reporting Menstrual Problem.  Symptoms began several days ago.  Interventions attempted: Nothing.  Symptoms are: gradually worsening.  Triage Disposition: See PCP Within 2 Weeks  Patient/caregiver understands and will follow disposition?: Yes   Copied from CRM [id number]. Topic: Clinical - Red Word Triage >> Jun 18, 2024  3:52 PM Rea BROCKS wrote: Red Word that prompted transfer to Nurse Triage: Menstrual cycle heavy bleeding, started Monday- havent had one in years, constant heavy pain on right side, light-headed. Reason for Disposition  Age > 40 years  Answer Assessment - Initial Assessment Questions 1. LMP:  When did your last menstrual period begin?      X 4 days  2. DAYS LATE: How many days late is your period?      Years late  5. REGULARITY: How regular are your periods?      Has been years since last menstrual x every three months        6. BIRTH CONTROL PILLS: Are you taking birth control pills, or have you stopped recently?      Denies  7. LONG-ACTING CONTRACEPTION: Has your doctor given you a shot to prevent pregnancy? (e.g., Depo-Provera injection) Do you have an intrauterine device (IUD)?      Denies  8. CAUSE: What do you think caused the missed period? (e.g., stress, rapid weight loss, excessive exercise)      Unknown  9. OTHER SYMPTOMS: Do you have any other symptoms? (e.g., abdomen pain)     Palm sized clots. Changing pads every hour. Lightheadedness Pt c/o right sided abdominal pain- cramp, constant   Pt reports menstrual bleeding and lightheadedness Pt scheduled for a visit on  01.16.26  for further evaluation. Pt agrees with plan of care, will call back for any worsening symptoms  Protocols used: Menstrual Period - Missed or Late-A-AH

## 2024-06-19 ENCOUNTER — Encounter: Payer: Self-pay | Admitting: Internal Medicine

## 2024-06-19 ENCOUNTER — Ambulatory Visit: Admitting: Internal Medicine

## 2024-06-19 ENCOUNTER — Telehealth: Payer: Self-pay

## 2024-06-19 ENCOUNTER — Other Ambulatory Visit

## 2024-06-19 ENCOUNTER — Ambulatory Visit (HOSPITAL_COMMUNITY)
Admission: RE | Admit: 2024-06-19 | Discharge: 2024-06-19 | Disposition: A | Discharge status: Home / Self Care | Source: Ambulatory Visit | Attending: Internal Medicine | Admitting: Internal Medicine

## 2024-06-19 VITALS — BP 124/80 | HR 84 | Temp 98.4°F | Ht 65.0 in | Wt 276.6 lb

## 2024-06-19 DIAGNOSIS — N950 Postmenopausal bleeding: Secondary | ICD-10-CM

## 2024-06-19 LAB — COMPREHENSIVE METABOLIC PANEL WITH GFR
ALT: 18 U/L (ref 3–35)
AST: 26 U/L (ref 5–37)
Albumin: 3.6 g/dL (ref 3.5–5.2)
Alkaline Phosphatase: 79 U/L (ref 39–117)
BUN: 4 mg/dL — ABNORMAL LOW (ref 6–23)
CO2: 29 meq/L (ref 19–32)
Calcium: 8.7 mg/dL (ref 8.4–10.5)
Chloride: 104 meq/L (ref 96–112)
Creatinine, Ser: 0.58 mg/dL (ref 0.40–1.20)
GFR: 101.95 mL/min
Glucose, Bld: 125 mg/dL — ABNORMAL HIGH (ref 70–99)
Potassium: 3.7 meq/L (ref 3.5–5.1)
Sodium: 139 meq/L (ref 135–145)
Total Bilirubin: 0.8 mg/dL (ref 0.2–1.2)
Total Protein: 7.2 g/dL (ref 6.0–8.3)

## 2024-06-19 LAB — CBC WITH DIFFERENTIAL/PLATELET
Basophils Absolute: 0.1 K/uL (ref 0.0–0.1)
Basophils Relative: 1 % (ref 0.0–3.0)
Eosinophils Absolute: 0.3 K/uL (ref 0.0–0.7)
Eosinophils Relative: 5.4 % — ABNORMAL HIGH (ref 0.0–5.0)
HCT: 36.2 % (ref 36.0–46.0)
Hemoglobin: 12.5 g/dL (ref 12.0–15.0)
Lymphocytes Relative: 41.9 % (ref 12.0–46.0)
Lymphs Abs: 2.4 K/uL (ref 0.7–4.0)
MCHC: 34.4 g/dL (ref 30.0–36.0)
MCV: 92.4 fl (ref 78.0–100.0)
Monocytes Absolute: 0.4 K/uL (ref 0.1–1.0)
Monocytes Relative: 7.9 % (ref 3.0–12.0)
Neutro Abs: 2.5 K/uL (ref 1.4–7.7)
Neutrophils Relative %: 43.8 % (ref 43.0–77.0)
Platelets: 262 K/uL (ref 150.0–400.0)
RBC: 3.91 Mil/uL (ref 3.87–5.11)
RDW: 15 % (ref 11.5–15.5)
WBC: 5.6 K/uL (ref 4.0–10.5)

## 2024-06-19 LAB — EXTRA SPECIMEN

## 2024-06-19 LAB — PROTIME-INR
INR: 1
Prothrombin Time: 10.5 s (ref 9.0–11.5)

## 2024-06-19 NOTE — Telephone Encounter (Signed)
 Pt seen in office 06/19/24

## 2024-06-19 NOTE — Telephone Encounter (Signed)
 [facility] INR was sent STAT to [facility]. Their instrument is down. [facility] is sending to [facility] STAT.

## 2024-06-22 ENCOUNTER — Encounter: Payer: Self-pay | Admitting: Internal Medicine

## 2024-06-22 ENCOUNTER — Ambulatory Visit: Payer: Self-pay | Admitting: Internal Medicine

## 2024-06-22 DIAGNOSIS — D259 Leiomyoma of uterus, unspecified: Secondary | ICD-10-CM | POA: Insufficient documentation

## 2024-06-22 NOTE — Progress Notes (Signed)
 Hi Sharon Fisher, Your ultrasound shows multiple uterine fibroids and a thickened endometrium (lining of the uterus).  The fibroids can contribute to your vaginal bleeding.  Please call your OB/GYN and get an appointment scheduled as soon as you can.  Also, your lab work is stable.  Despite having vaginal bleeding, there is no anemia currently present.  Rosina

## 2024-07-09 ENCOUNTER — Telehealth: Payer: Self-pay

## 2024-07-09 NOTE — Telephone Encounter (Signed)
 Patient has been notified directly; all questions, if any, were answered. Patient voiced understanding.    Pt stated she would make an OBGYN appointment
